# Patient Record
Sex: Male | Born: 1955
Health system: Southern US, Community
[De-identification: ages and names within clinical notes are randomized; demographics above are authoritative.]

## PROBLEM LIST (undated history)

## (undated) DIAGNOSIS — I2510 Atherosclerotic heart disease of native coronary artery without angina pectoris: Secondary | ICD-10-CM

## (undated) DIAGNOSIS — I252 Old myocardial infarction: Secondary | ICD-10-CM

## (undated) DIAGNOSIS — E7800 Pure hypercholesterolemia, unspecified: Secondary | ICD-10-CM

## (undated) DIAGNOSIS — E8881 Metabolic syndrome: Secondary | ICD-10-CM

## (undated) DIAGNOSIS — I10 Essential (primary) hypertension: Secondary | ICD-10-CM

## (undated) DIAGNOSIS — M1990 Unspecified osteoarthritis, unspecified site: Secondary | ICD-10-CM

## (undated) DIAGNOSIS — M549 Dorsalgia, unspecified: Secondary | ICD-10-CM

## (undated) DIAGNOSIS — M25559 Pain in unspecified hip: Secondary | ICD-10-CM

## (undated) DIAGNOSIS — G4733 Obstructive sleep apnea (adult) (pediatric): Secondary | ICD-10-CM

## (undated) DIAGNOSIS — G5600 Carpal tunnel syndrome, unspecified upper limb: Secondary | ICD-10-CM

## (undated) DIAGNOSIS — E119 Type 2 diabetes mellitus without complications: Secondary | ICD-10-CM

## (undated) DIAGNOSIS — Z9861 Coronary angioplasty status: Secondary | ICD-10-CM

## (undated) DIAGNOSIS — H9319 Tinnitus, unspecified ear: Secondary | ICD-10-CM

## (undated) HISTORY — DX: Carpal tunnel syndrome, unspecified upper limb: G56.00

## (undated) HISTORY — PX: CORONARY ANGIOPLASTY WITH STENT PLACEMENT: SHX49

## (undated) HISTORY — DX: Essential (primary) hypertension: I10

## (undated) HISTORY — DX: Atherosclerotic heart disease of native coronary artery without angina pectoris: I25.10

## (undated) HISTORY — PX: WRIST SURGERY: SHX841

## (undated) HISTORY — DX: Metabolic syndrome: E88.81

## (undated) HISTORY — DX: Old myocardial infarction: I25.2

## (undated) HISTORY — DX: Obstructive sleep apnea (adult) (pediatric): G47.33

## (undated) HISTORY — DX: Dorsalgia, unspecified: M54.9

## (undated) HISTORY — DX: Coronary angioplasty status: Z98.61

## (undated) HISTORY — DX: Tinnitus, unspecified ear: H93.19

## (undated) HISTORY — PX: TONSILLECTOMY: SUR1361

## (undated) HISTORY — DX: Unspecified osteoarthritis, unspecified site: M19.90

## (undated) HISTORY — DX: Pain in unspecified hip: M25.559

---

## 1999-03-05 ENCOUNTER — Emergency Department (HOSPITAL_COMMUNITY): Admission: EM | Admit: 1999-03-05 | Discharge: 1999-03-05 | Payer: Self-pay | Admitting: Emergency Medicine

## 1999-09-28 ENCOUNTER — Emergency Department (HOSPITAL_COMMUNITY): Admission: EM | Admit: 1999-09-28 | Discharge: 1999-09-28 | Payer: Self-pay | Admitting: Emergency Medicine

## 2006-03-31 ENCOUNTER — Encounter: Admission: RE | Admit: 2006-03-31 | Discharge: 2006-03-31 | Payer: Self-pay | Admitting: Family Medicine

## 2007-10-07 LAB — HM COLONOSCOPY: HM Colonoscopy: NORMAL

## 2008-02-14 ENCOUNTER — Inpatient Hospital Stay (HOSPITAL_COMMUNITY): Admission: AD | Admit: 2008-02-14 | Discharge: 2008-02-16 | Payer: Self-pay | Admitting: Cardiology

## 2008-02-14 ENCOUNTER — Ambulatory Visit: Payer: Self-pay | Admitting: Cardiology

## 2008-03-01 ENCOUNTER — Ambulatory Visit: Payer: Self-pay | Admitting: Cardiology

## 2008-03-09 ENCOUNTER — Encounter (HOSPITAL_COMMUNITY): Admission: RE | Admit: 2008-03-09 | Discharge: 2008-04-05 | Payer: Self-pay | Admitting: Cardiology

## 2009-03-06 ENCOUNTER — Telehealth: Payer: Self-pay | Admitting: Cardiology

## 2009-08-08 ENCOUNTER — Encounter: Payer: Self-pay | Admitting: Cardiology

## 2009-08-08 LAB — CONVERTED CEMR LAB
ALT: 21 units/L
BUN: 14 mg/dL
Creatinine, Ser: 0.9 mg/dL
Direct LDL: 64 mg/dL
HDL: 77 mg/dL
Hgb A1c MFr Bld: 8.8 %
LDL Cholesterol: 50 mg/dL
Potassium: 3.7 meq/L
Total CK: 1480 units/L
Triglycerides: 62 mg/dL

## 2009-08-15 ENCOUNTER — Encounter: Payer: Self-pay | Admitting: Cardiology

## 2009-08-23 ENCOUNTER — Encounter: Payer: Self-pay | Admitting: Cardiology

## 2010-01-30 ENCOUNTER — Ambulatory Visit: Payer: Self-pay | Admitting: Family Medicine

## 2010-03-13 ENCOUNTER — Ambulatory Visit: Payer: Self-pay | Admitting: Family Medicine

## 2010-04-24 ENCOUNTER — Ambulatory Visit: Payer: Self-pay | Admitting: Family Medicine

## 2010-05-08 ENCOUNTER — Encounter (INDEPENDENT_AMBULATORY_CARE_PROVIDER_SITE_OTHER): Payer: Self-pay | Admitting: *Deleted

## 2010-05-14 ENCOUNTER — Telehealth: Payer: Self-pay | Admitting: Cardiology

## 2010-06-26 ENCOUNTER — Ambulatory Visit: Payer: Self-pay | Admitting: Cardiology

## 2010-06-26 ENCOUNTER — Encounter (INDEPENDENT_AMBULATORY_CARE_PROVIDER_SITE_OTHER): Payer: Self-pay | Admitting: *Deleted

## 2010-06-26 DIAGNOSIS — I152 Hypertension secondary to endocrine disorders: Secondary | ICD-10-CM

## 2010-06-26 DIAGNOSIS — E1159 Type 2 diabetes mellitus with other circulatory complications: Secondary | ICD-10-CM

## 2010-06-26 DIAGNOSIS — I10 Essential (primary) hypertension: Secondary | ICD-10-CM

## 2010-06-26 DIAGNOSIS — I25110 Atherosclerotic heart disease of native coronary artery with unstable angina pectoris: Secondary | ICD-10-CM

## 2010-06-26 DIAGNOSIS — E1169 Type 2 diabetes mellitus with other specified complication: Secondary | ICD-10-CM | POA: Insufficient documentation

## 2010-06-26 DIAGNOSIS — E782 Mixed hyperlipidemia: Secondary | ICD-10-CM

## 2010-06-26 HISTORY — DX: Hypertension secondary to endocrine disorders: I15.2

## 2010-06-26 HISTORY — DX: Type 2 diabetes mellitus with other circulatory complications: E11.59

## 2010-06-26 HISTORY — DX: Atherosclerotic heart disease of native coronary artery with unstable angina pectoris: I25.110

## 2010-07-10 ENCOUNTER — Ambulatory Visit: Payer: Self-pay | Admitting: Family Medicine

## 2010-11-05 NOTE — Letter (Signed)
Summary: Generic Letter  [facility], Main Office  [address]   [place], [place] [address]   Phone: [phone]  Fax: [phone]        June 26, 2010 MRN: [id number]    Philip Black [address] [place], [place]  [address]    To Whom it May Concern:  Mr. Messimer may use Cialis or Viagra as needed. He may not use this in conjunction with nitroglycerin. If you have any questions, feel free to contact our office.         Sincerely,   Charlies Constable, MD Sherri Rad, RN, BSN

## 2010-11-05 NOTE — Progress Notes (Signed)
Summary: pt needs refill/pt is out of meds   Phone Note Refill Request Call back at Home Phone [phone] Message from:  [facility] [address]  Refills Requested: Medication #1:  PLAVIX 75 MG TABS Take one tablet by mouth daily Initial call taken by: Omer Jack,  May 14, 2010 11:30 AM  Follow-up for Phone Call        Called pharmacy, pt has refills left on RX at pharmacy pt notified to pick up. Marrion Coy, CNA  May 14, 2010 11:35 AM  Follow-up by: Marrion Coy, CNA,  May 14, 2010 11:35 AM

## 2010-11-05 NOTE — Letter (Signed)
Summary: Appointment - Reminder 2  [facility] [employer], Main Office  [address]   [place], [place] [address]   Phone: [phone]  Fax: [phone]     May 08, 2010 MRN: [id number]   Philip Black [address] [place], [place]  [address]   Dear Mr. SPRONG,  Our records indicate that it is time to schedule a follow-up appointment.  Dr.Brodie recommended that you follow up with [place] at your earliest convenience. It is very important that we reach you to schedule this appointment. We look forward to participating in your health care needs. Please contact us at the number listed above at your earliest convenience to schedule your appointment.  If you are unable to make an appointment at this time, give [place] a call so we can update our records.     Sincerely, LG Glass blower/designer

## 2010-11-05 NOTE — Assessment & Plan Note (Signed)
Summary: rov  Medications Added METFORMIN HCL 500 MG TABS (METFORMIN HCL) 2 tabs two times a day NITROSTAT 0.4 MG SUBL (NITROGLYCERIN) 1 tablet under tongue at onset of chest pain; you may repeat every 5 minutes for up to 3 doses. LIPITOR 10 MG TABS (ATORVASTATIN CALCIUM) Take one tablet by mouth daily. ASPIRIN 81 MG TBEC (ASPIRIN) Take one tablet by mouth daily      Allergies Added: NKDA  Visit Type:  rov Primary Provider:  Renford Dills  CC:  pt c/o bilateral leg cramps states this goes all the way up to his thigh says this happens more when it is hot and he is exhausted...denies any other complaints today.  History of Present Illness: Philip Black is 55 years old and return for follow up management of CAD after meds in 2 years. In 2009 he had an anterior MI treated with a drug-eluting stent to the LAD. He had residual 80% narrowing in the posterolateral branch and 90% narrowing of the marginal branch the circumflex artery. He is done well since that time. He has lost considerable weight. He's had no chest pain or palpitations.  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  His other problems include diabetes, hypertension, hyperlipidemia, and obstructive sleep apnea.  Current Medications (verified): 1)  Metoprolol Tartrate 50 Mg Tabs (Metoprolol Tartrate) .... Take One Tablet By Mouth Twice A Day 2)  Plavix 75 Mg Tabs (Clopidogrel Bisulfate) .... Take One Tablet By Mouth Daily 3)  Zetia 10 Mg Tabs (Ezetimibe) .... Take One Tablet By Mouth Daily. 4)  Actos 45 Mg Tabs (Pioglitazone Hcl) .Marland Kitchen.. 1 Tab Once Daily 5)  Hydrochlorothiazide 25 Mg Tabs (Hydrochlorothiazide) .Marland Kitchen.. 1 Tab Once Daily 6)  Quinapril Hcl 40 Mg Tabs (Quinapril Hcl) .Marland Kitchen.. 1 Tab Once Daily 7)  Metformin Hcl 500 Mg Tabs (Metformin Hcl) .... 2 Tabs Two Times A Day 8)  Nitrostat 0.4 Mg Subl (Nitroglycerin) .Marland Kitchen.. 1 Tablet Under Tongue At Onset of Chest Pain; You May Repeat Every 5 Minutes For  Up To 3 Doses. 9)  Lipitor 10 Mg Tabs (Atorvastatin Calcium) .... Take One Tablet By Mouth Daily. 10)  Aspirin 81 Mg Tbec (Aspirin) .... Take One Tablet By Mouth Daily  Allergies (verified): No Known Drug Allergies  Past History:  Past Medical History: Reviewed history from 03/27/2009 and no changes required.  1. Coronary artery disease.   2. Hypertension.   3. Hyperlipidemia.   4. Morbid obesity.   5. Type 2 diabetes mellitus.   6. Obstructive sleep apnea.      Vital Signs:  Patient profile:   55 year old male Height:      69 inches Weight:      291.8 pounds BMI:     43.25 Pulse rate:   71 / minute Pulse rhythm:   regular BP sitting:   140 / 87  (left arm) Cuff size:   large  Vitals Entered By: Philip Black, CMA (June 26, 2010 3:00 PM)  Physical Exam  Additional Exam:  Gen. Well-nourished, in no distress   Neck: No JVD, thyroid not enlarged, no carotid bruits Lungs: No tachypnea, clear without rales, rhonchi or wheezes Cardiovascular: Rhythm regular, PMI not displaced,  heart sounds  normal, no murmurs or gallops, no peripheral edema, pulses normal in all 4 extremities. Abdomen: BS normal, abdomen soft and non-tender without masses or organomegaly, no hepatosplenomegaly. MS: No deformities, no cyanosis or clubbing   Neuro:  No focal sns   Skin:  no lesions    Impression & Recommendations:  Problem # 1:  CORONARY ATHEROSCLEROSIS NATIVE CORONARY ARTERY (ICD-414.01) A. previous antrum with a drug-eluting stent to the LAD in May of 2009. He's had no chest pain was prominent are stable. Continue his current medications including Plavix. His updated medication list for this problem includes:    Metoprolol Tartrate 50 Mg Tabs (Metoprolol tartrate) .Marland Kitchen... Take one tablet by mouth twice a day    Plavix 75 Mg Tabs (Clopidogrel bisulfate) .Marland Kitchen... Take one tablet by mouth daily    Quinapril Hcl 40 Mg Tabs (Quinapril hcl) .Marland Kitchen... 1 tab once daily    Nitrostat 0.4 Mg Subl  (Nitroglycerin) .Marland Kitchen... 1 tablet under tongue at onset of chest pain; you may repeat every 5 minutes for up to 3 doses.  Orders: EKG w/ Interpretation (93000)  His updated medication list for this problem includes:    Metoprolol Tartrate 50 Mg Tabs (Metoprolol tartrate) .Marland Kitchen... Take one tablet by mouth twice a day    Plavix 75 Mg Tabs (Clopidogrel bisulfate) .Marland Kitchen... Take one tablet by mouth daily    Quinapril Hcl 40 Mg Tabs (Quinapril hcl) .Marland Kitchen... 1 tab once daily    Nitrostat 0.4 Mg Subl (Nitroglycerin) .Marland Kitchen... 1 tablet under tongue at onset of chest pain; you may repeat every 5 minutes for up to 3 doses.  Problem # 2:  HYPERTENSIVE CARDIOVASCULAR DISEASE, BENIGN (ICD-402.10) His blood pressure is reasonably well controlled on his current medications. I encouraged him to lose weight to improve this further. His updated medication list for this problem includes:    Metoprolol Tartrate 50 Mg Tabs (Metoprolol tartrate) .Marland Kitchen... Take one tablet by mouth twice a day    Hydrochlorothiazide 25 Mg Tabs (Hydrochlorothiazide) .Marland Kitchen... 1 tab once daily    Quinapril Hcl 40 Mg Tabs (Quinapril hcl) .Marland Kitchen... 1 tab once daily    Aspirin 81 Mg Tbec (Aspirin) .Marland Kitchen... Take one tablet by mouth daily  Orders: EKG w/ Interpretation (93000)  Problem # 3:  HYPERLIPIDEMIA, MIXED (ICD-272.2) This is managed with Lipitor. He will have a repeat lipid profile his primary care physician. His updated medication list for this problem includes:    Zetia 10 Mg Tabs (Ezetimibe) .Marland Kitchen... Take one tablet by mouth daily.    Lipitor 10 Mg Tabs (Atorvastatin calcium) .Marland Kitchen... Take one tablet by mouth daily.  Orders: EKG w/ Interpretation (93000)  Patient Instructions: 1)  Your physician wants you to follow-up in: 1 year with Dr. Clifton Black.  You will receive a reminder letter in the mail two months in advance. If you don't receive a letter, please call our office to schedule the follow-up appointment. 2)  Your physician recommends that you continue  on your current medications as directed. Please refer to the Current Medication list given to you today.

## 2010-11-06 ENCOUNTER — Ambulatory Visit: Admit: 2010-11-06 | Payer: Self-pay | Admitting: Family Medicine

## 2010-11-06 ENCOUNTER — Ambulatory Visit (INDEPENDENT_AMBULATORY_CARE_PROVIDER_SITE_OTHER): Payer: 59 | Admitting: Family Medicine

## 2010-11-06 DIAGNOSIS — E119 Type 2 diabetes mellitus without complications: Secondary | ICD-10-CM

## 2010-11-06 DIAGNOSIS — I10 Essential (primary) hypertension: Secondary | ICD-10-CM

## 2010-11-06 DIAGNOSIS — G4700 Insomnia, unspecified: Secondary | ICD-10-CM

## 2010-11-06 DIAGNOSIS — E669 Obesity, unspecified: Secondary | ICD-10-CM

## 2010-12-29 ENCOUNTER — Encounter: Payer: Self-pay | Admitting: Family Medicine

## 2011-01-07 NOTE — Medication Information (Signed)
Summary[facility]   Imported By: Dorna Leitz 12/29/2010 14:33:46  _____________________________________________________________________  External Attachment:    Type:   Image     Comment:   External Document

## 2011-01-08 ENCOUNTER — Encounter: Payer: Self-pay | Admitting: Family Medicine

## 2011-02-12 ENCOUNTER — Encounter: Payer: Self-pay | Admitting: Family Medicine

## 2011-02-12 ENCOUNTER — Ambulatory Visit: Payer: 59 | Admitting: Family Medicine

## 2011-02-12 ENCOUNTER — Ambulatory Visit (INDEPENDENT_AMBULATORY_CARE_PROVIDER_SITE_OTHER): Payer: 59 | Admitting: Family Medicine

## 2011-02-12 DIAGNOSIS — E119 Type 2 diabetes mellitus without complications: Secondary | ICD-10-CM

## 2011-02-12 NOTE — Patient Instructions (Signed)
Continue with her diet and exercise regimen. So make sure you check your blood sugars 2 hours after some of your meals.

## 2011-02-12 NOTE — Progress Notes (Signed)
  Subjective:    Patient ID: Philip Black, male    DOB: 1956-05-19, 55 y.o.   MRN: [id number]  HPI he is here for a diabetes recheck. He has make diet and exercise changes. He is now using a trainer to help him. As check his blood sugars in the morning and they're running right around 130. Continues to remain smoke free. Has noted an increase in his energy and he sleeping much better.    Review of Systems     Objective:   Physical Exam alert and in no distress. The globe A1c is 10.6       Assessment & Plan:  Diabetes. Hypertension. Dyslipidemia. ASHD. I discussed diet and exercise with him again. Encouraged him to continue with his exercise program. He is also to check his blood sugars 2 hours after some of his meals. I will also set him up for diabetes education. Check here in 4 months.

## 2011-02-18 NOTE — Discharge Summary (Signed)
NAMESIMCHA, [name]                 ACCOUNT NO.:  [id number]   MEDICAL RECORD NO.:  [id number]          PATIENT TYPE:  INP   LOCATION:  2040                         FACILITY:  [facility]   PHYSICIAN:  Philip [name]. Philip Chance, MD, FACCDATE OF BIRTH:  Jun 13, 1956   DATE OF ADMISSION:  02/14/2008  DATE OF DISCHARGE:  02/16/2008                               DISCHARGE SUMMARY   PRIMARY CARDIOLOGIST:  Philip [name]. Philip Chance, MD, [facility]   PRIMARY CARE Philip [name]:  Philip [name]. Philip Settle, MD.   ENDOCRINOLOGIST:  Philip [name]. Altheimer, MD   DISCHARGE DIAGNOSIS:  Acute anterior ST-segment elevation myocardial  infarction.   SECONDARY DIAGNOSES:  1. Coronary artery disease.  2. Hypertension.  3. Hyperlipidemia.  4. Morbid obesity.  5. Type 2 diabetes mellitus.  6. Obstructive sleep apnea.   ALLERGIES:  No known drug allergies.   PROCEDURE:  Left heart cardiac catheterization with successful  percutaneous coronary intervention and stenting of the proximal left  anterior descending with placement of 3.0 x 28 mm PROMUS drug-eluting  stent.   HISTORY OF PRESENT ILLNESS:  A 55 year old [place] American male with  multiple cardiac risk factors who is in his usual state of health until  Feb 14, 2008, when he awoke at 4:00 a.m. with 10/10 substernal chest  discomfort with radiation to the right arm.  He presented to the [facility] where his ECG was noted to be abnormal with  suspicion for ST elevation MI and the patient was taken urgently by EMS  directly to the [facility].   HOSPITAL COURSE:  The patient underwent emergent cardiac catheterization  revealing 90% stenosis in the proximal LAD with diffuse 90% stenoses in  the second obtuse marginal as well as an 80% stenosis in the LPDA.  The  LAD was felt to be in the infarct vessel.  EF was 60% with normal wall  motion.  Attention was turned to the LAD which was subsequently,  successfully stented with a 3.0 x 28-mm PROMUS drug-eluting stent.   The  patient tolerated this procedure well, was monitored in the Coronary  Intensive Care Unit postprocedure where he peaked his CK 1420, his MB at  20.3, and troponin I at 0.16.  The patient was maintained on aspirin,  Plavix, statin, and beta blocker therapy or as previously prescribed,  calcium channel blocker was discontinued.  The patient was transferred  back to the floor on Feb 15, 2008, and has been ambulating without  recurrent symptoms or limitations.  He has been evaluated by cardiac  rehab and has been extensively counseled the importance of dietary  restrictions, weight loss, and in cooperation of exercise into his  lifestyle.  The patient was very motivated to make lifestyle changes.  Philip [name] will be discharged home today in good condition.   DISCHARGE LABS:  Hemoglobin 12.7, hematocrit 37.2, WBC 11.6, platelets  194, INR 1.0., sodium 136, potassium 3.7, chloride 102, CO2 29, BUN 10,  creatinine 1.01, glucose 165, total bilirubin 0.6, alkaline phosphatase  45, AST 37, ALT 43, total protein 6.3, albumin 3.3, calcium 8.9,  CK 94,  MB 13.2, troponin I 0.16, total cholesterol 134, triglycerides 57, HDL  51, LDL 92, and TSH 2.123.   DISPOSITION:  The patient is being discharged home today in good  condition.   FOLLOWUP AND APPOINTMENTS:  We will arrange followup with Dr. Regino Schultze  PA, Philip [name], on Mar 01, 2008, at 10:45 a.m..  He was asked to  follow up with Dr. Leslie [name] and Dr. Nehemiah [name] as previously scheduled.   DISCHARGE MEDICATIONS:  1. Aspirin 325 mg daily.  2. Plavix 75 mg daily.  3. Welchol 1250 mg b.i.d.  4. Quinapril 40 mg daily.  5. Glyburide 5 mg daily.  6. HCTZ 25 mg daily.  7. Actos 45 mg daily.  8. Zetia 10 mg daily.  9. Byetta 10 mcg injected subcutaneously t.i.d. with meals.  10.Crestor 5 mg daily.  11.Lopressor 50 mg b.i.d.  12.Nitroglycerin 0.4 mg sublingual p.r.n. chest pain.   OUTSTANDING LAB STUDIES:  None.   DURATION OF  DISCHARGE/ENCOUNTER:  65 minutes including physician time.      Philip [name], ANP      Philip R. Philip Chance, MD, [facility]  Electronically Signed    CB/MEDQ  D:  02/16/2008  T:  02/17/2008  Job:  [id number]   cc:   Philip [name], M.D.  Philip [name]. Polite, M.D.

## 2011-02-18 NOTE — H&P (Signed)
NAMEDEAUNTE, DENTE                 ACCOUNT NO.:  [id number]   MEDICAL RECORD NO.:  [id number]          PATIENT TYPE:  INP   LOCATION:  2908                         FACILITY:  [facility]   PHYSICIAN:  Everardo Beals. Juanda Chance, MD, FACCDATE OF BIRTH:  25-Jun-1956   DATE OF ADMISSION:  02/14/2008  DATE OF DISCHARGE:                              HISTORY & PHYSICAL   PRIMARY CARE PHYSICIAN:  Deirdre Peer. Polite, M.D.   PRIMARY CARDIOLOGIST:  New will be Bruce R. Juanda Chance, MD, [facility]   CHIEF COMPLAINT:  Chest pain.   HISTORY OF PRESENT ILLNESS:  Mr. Eisenbeis is a 55 year old male with no  previous history of coronary artery disease with multiple cardiac risk  factors.  He was awakened at 4:00 a.m. today by chest pain.  It reached  to 10/10.  It was in the middle of his chest that radiated over to his  right arm.  When his symptoms did not resolve, he went to [facility].  There, his EKG was noted to be abnormal.  He has had 4  baby aspirin and was transported urgently by the EMS directly to the  cath lab.  Upon arrival to the cath lab, he was pain free.   Mr. Cocuzza states he has never had pain like this before.  It was  associated with shortness of breath and nausea, but no vomiting or  diaphoresis.  He is currently resting comfortably.   PAST MEDICAL HISTORY:  1. Diabetes.  2. Hypertension.  3. Hyperlipidemia.  4. Morbid obesity with a body mass index of 47.  5. Obstructive sleep apnea.   FAMILY HISTORY:  Coronary artery disease.  Mother died at age 77 with  diabetes.  His father died at age 75 with a history of hypertension and  coronary artery disease.  He has no siblings with coronary artery  disease.   SURGICAL HISTORY:  None known.   ALLERGIES:  No known drug allergies.   CURRENT MEDICATIONS:  1. Byetta 10 mcg.  2. WelChol 625 mg.  3. Crestor 5 mg.  4. Cardizem CD 300 mg.  5. Accupril 40 mg.  6. Glyburide 5 mg.  7. Hydrochlorothiazide 25 mg.  8. Actos 45 mg.  9. Zetia 10  mg.   SOCIAL HISTORY:  Lives in [place] with his wife and works in the city  of [place] as well as [employer].  He denies any history of alcohol,  tobacco, or drug abuse.   REVIEW OF SYSTEMS:  He has had no recent fevers, chills, or sweats.  There is no coughing or wheezing.  He has occasional daytime edema.  He  has some arthralgias.  He has not had hematemesis, hemoptysis, or  melena.  Full 14-point review of systems is otherwise negative.   PHYSICAL EXAMINATION:  VITAL SIGNS:  He is afebrile.  Blood pressure  130/95, heart rate 57, respiratory rate 18, and nonlabored.  GENERAL:  He is a well-developed, obese African American male in no  acute distress.  HEENT:  Normal.  NECK:  There is no lymphadenopathy, thyromegaly, bruit, or JVD  noted.  CV:  Heart is regular rate and rhythm with an S1 and S2.  No critical  murmur, rub, or gallop is noted.  CHEST:  Essentially clear to auscultation bilaterally.  ABDOMEN:  Soft and nontender with active bowel sounds.  EXTREMITIES:  There is no cyanosis, clubbing, or edema noted.  Distal  pulses are intact in all 4 extremities, and no femoral bruits are  appreciated.  SKIN:  No rashes or lesions are noted.  MUSCULOSKELETAL:  There is no joint deformity or effusions.  NEURO:  He is alert and oriented.  Cranial nerves II-XII grossly intact.   EKG, his sinus bradycardia rate 57 with ST elevation in the lateral  leads versus early repolarization.  Laboratory values and chest x-ray  are pending.   IMPRESSION:  ST elevation myocardial infarction:  He is being taken  directly to the cath lab with further evaluation and treatment depending  on the results of the catheterization.  He will be continued on his home  medications, and we will check a lipid profile and hemoglobin A1c.      Theodore Demark, PA-C      Bruce R. Juanda Chance, MD, [facility]  Electronically Signed    RB/MEDQ  D:  02/14/2008  T:  02/15/2008  Job:  [id number]   cc:   Deirdre Peer.  Polite, M.D.

## 2011-02-18 NOTE — Cardiovascular Report (Signed)
NAMEJORDON, KRISTIANSEN                 ACCOUNT NO.:  [id number]   MEDICAL RECORD NO.:  [id number]          PATIENT TYPE:  INP   LOCATION:  2908                         FACILITY:  [facility]   PHYSICIAN:  Everardo Beals. Juanda Chance, MD, FACCDATE OF BIRTH:  1956/08/18   DATE OF PROCEDURE:  02/14/2008  DATE OF DISCHARGE:                            CARDIAC CATHETERIZATION   CLINICAL HISTORY:  Mr. Bowen is 55 years old and has no prior history of  known heart disease, although he does have diabetes, hypertension, sleep  apnea, and obesity.  He had the onset of chest pain at 4:00 a.m. and was  driven by his [facility] this  morning.  His ECG there showed 1-mm ST elevation in the precordial leads  and code STEMI was called and he was transferred by ambulance here.  His  ECG on arrival here showed ST elevation, but it was not certain whether  this was early repolarization or injury.   The procedure was performed on the right femoral artery using arterial  sheath and 6-French preformed coronary catheters.  We had difficulty  with arterial access, but we were finally able to obtain access with a  Doppler-guided needle.  After completion of diagnostic study, we made a  decision to proceed with intervention on the lesion in the proximal LAD.  The patient was given antiemetics bolus and infusion and had been given  4 chewable aspirin in ambulance.  A 600 mg of Plavix was given at the  end of the procedure.  We used a Q4 6-French guiding catheter with side  holes.  We crossed the lesion with a Prowater wire without difficulty.  We predilated the lesion with a 2.5-mm x 50-mm apex balloon performing 2  inflations up to 12 atmospheres for 30 seconds.  We then deployed a 3.0-  mm x 28-mm PROMUS stent performing 1 inflation up to 14 atmospheres for  30 seconds.  We then postdilated with a 3.5-mm x 20-mm Quantum Maverick  following 2 inflations up to 18 atmospheres for 30 seconds.  We  then  postdilated the focal area with a 2.75-mm x 12-mm Quantum Maverick  performing 1 inflation up to 20 atmospheres for 30 seconds.  Final  diagnostics were then performed through the guiding catheter.  The  patient tolerated the procedure well and left laboratory in satisfactory  condition.   RESULTS:  Aortic pressure was 135/78 with a mean of 101, left  ventricular pressure 135/20.   Left main coronary artery:  The left main coronary artery was free of  disease.   Left anterior descending artery:  The left descending artery gave rise  to 3 diagonal branches and 2 set of perforators.  There was a 90%  narrowing in the proximal LAD with segmental disease over about 25 mm.   The circumflex artery:  This was a dominant vessel, gave rise to a first  large marginal branch, second small marginal branch to posterolateral  branch and posterior descending branch.  There were tandem 90% stenosis  in the second marginal branch, which is  a small vessel.  There is 50%  narrowing in the mid circumflex artery.  There is 80% narrowing in the  proximal portion of the posterior descending branch.   The right coronary artery:  The right coronary artery was a moderate-  sized vessel, gave rise to a conus branch and 2 right ventricle  branches.  This vessel was free of disease.   The left ventricle:  The left ventriculogram performed on RAO projection  showed good wall motion with no areas of hypokinesis.  Estimated  ejection fraction was 60%.   Following stenting of the lesion, LAD stenosis improved from 90% to 0%.  The flow was TIMI III before and after intervention.   CONCLUSION:  Coronary artery disease with possible ST-elevation  myocardial infarction with 90% stenosis in the proximal LAD, 90%  stenosis in the second marginal branch of circumflex artery, 50%  narrowing in the mid circumflex artery, and 80% narrowing in the  posterior branch off the circumflex artery, no significant  obstruction.  A small dominant right coronary and normal LV function.  1. Successful PCI of the lesion in the proximal LAD using a PROMUS      drug-eluting stent with improvement of the central narrowing from      90% to 0% with TIMI III flow before and after intervention.   RECOMMENDATIONS:  The patient returned to post angio room for further  observation.  It is not clear whether this was STEMI or not.  There was  no clear wall motion abnormality.  His enzymes returned positive.  I  would classify this as STEMI.  If not, I would classify it as unstable  angina.  We will plan and continued secondary risk factor modification.      Bruce Elvera Lennox Juanda Chance, MD, [facility]  Electronically Signed     BRB/MEDQ  D:  02/14/2008  T:  02/15/2008  Job:  [id number]   cc:   [facility]

## 2011-02-18 NOTE — Assessment & Plan Note (Signed)
[name]                            CARDIOLOGY OFFICE NOTE   Philip [name], DUCHEMIN                        MRN:          [id number]  DATE:03/01/2008                            DOB:          1955-12-19    PRIMARY CARDIOLOGIST:  Dr. Charlies Constable.   PRIMARY CARE [name]:  Dr. Renford Dills.   ENDOCRINOLOGIST:  Dr. Casimiro Needle Altheimer.   HISTORY OF PRESENT ILLNESS:  This is a 55 year old [place] American male  patient who presented with an acute anterior ST-segment elevation MI on  Feb 14, 2008.  He underwent cardiac catheterization by Dr. Charlies Constable  and had a Promus drug-eluting stent to the proximal LAD with reduction  in lesion from 90 to 0.  He also has residual 90% stenosis in the second  marginal branch of the circumflex, 50% narrowing in the mid circumflex  and 80% narrowing in the posterior branch off the circumflex artery.  He  has a small dominant RCA without obstruction and normal LV function.  Since the patient has been home, he has only exercised once on the  treadmill, and he stated he became very tired and sore.  He denied any  chest pain, palpitations, dyspnea, dyspnea on exertion.  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   CURRENT MEDICATIONS:  1. Plavix 75 mg daily.  2. Zetia 10 mg daily.  3. Welchol 625 mg 6 daily.  4. Glipizide 5 mg daily.  5. Actos 45 mg daily.  6. Hydrochlorothiazide 25 mg daily.  7. Quinapril 40 mg daily.  8. Beta 10 mg 3 daily.  9. Metoprolol 50 mg daily.   PHYSICAL EXAMINATION:  GENERAL:  This is a very pleasant 55 year old  obese African American male in no acute distress.  VITAL SIGNS:  Blood pressure 130/79, pulse 55, weight 305.  NECK:  Without JVD, HJR, bruit or thyroid enlargement.  LUNGS:  Clear anterior posterior lateral.  HEART:  Regular rate and rhythm at 55 beats per minute, normal S1-S2, no  murmur, rub, bruit, thrill or heave noted.  ABDOMEN:  Obese.  Normoactive bowel sounds are heard throughout.  No  organomegaly, masses, lesions or abnormal tenderness.  Right groin  without hematoma or hemorrhage.  LOWER EXTREMITIES:  He has no cyanosis, clubbing or edema.  Has good  distal pulses.   STUDIES:  EKG sinus bradycardia with nonspecific ST-T wave changes.   IMPRESSION:  1. Acute anterior ST elevation MI treated with Promus drug-eluting      stent to the proximal LAD on Feb 14, 2008, with residual 90% OM II,      50% mid circumflex, and 80% posterior branch off the circumflex      normal LV function.  2. Morbid obesity.  3. Diabetes mellitus.  4. Hypertension.  5. Hyperlipidemia.  6. Obstructive sleep apnea.  PLAN:  At this time, I have stressed the importance of losing weight  with this patient and highly recommend cardiac rehab to get him on an  exercise program.  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  I have asked him to call if he has any further  chest pain and he will see Dr. Juanda Chance back in 2 months.      Philip Reedy, PA-C  Electronically Signed      Philip Sidle, MD  Electronically Signed   ML/MedQ  DD: 03/01/2008  DT: 03/01/2008  Job #: [id number]

## 2011-03-12 ENCOUNTER — Other Ambulatory Visit: Payer: Self-pay

## 2011-03-12 MED ORDER — ROSUVASTATIN CALCIUM 20 MG PO TABS: 20.0000 mg | ORAL_TABLET | Freq: Every day | ORAL | Status: DC

## 2011-03-17 ENCOUNTER — Other Ambulatory Visit: Payer: Self-pay | Admitting: Family Medicine

## 2011-03-17 MED ORDER — METFORMIN HCL 500 MG PO TABS: 1000.0000 mg | ORAL_TABLET | Freq: Two times a day (BID) | ORAL | Status: DC

## 2011-05-02 ENCOUNTER — Other Ambulatory Visit: Payer: Self-pay | Admitting: *Deleted

## 2011-05-02 MED ORDER — CLOPIDOGREL BISULFATE 75 MG PO TABS: 75.0000 mg | ORAL_TABLET | Freq: Every day | ORAL | Status: DC

## 2011-05-14 ENCOUNTER — Ambulatory Visit (INDEPENDENT_AMBULATORY_CARE_PROVIDER_SITE_OTHER): Payer: 59 | Admitting: Family Medicine

## 2011-05-14 ENCOUNTER — Encounter: Payer: Self-pay | Admitting: Family Medicine

## 2011-05-14 DIAGNOSIS — E669 Obesity, unspecified: Secondary | ICD-10-CM | POA: Insufficient documentation

## 2011-05-14 DIAGNOSIS — I10 Essential (primary) hypertension: Secondary | ICD-10-CM

## 2011-05-14 DIAGNOSIS — M25511 Pain in right shoulder: Secondary | ICD-10-CM

## 2011-05-14 DIAGNOSIS — I152 Hypertension secondary to endocrine disorders: Secondary | ICD-10-CM

## 2011-05-14 DIAGNOSIS — IMO0002 Reserved for concepts with insufficient information to code with codable children: Secondary | ICD-10-CM | POA: Insufficient documentation

## 2011-05-14 DIAGNOSIS — E119 Type 2 diabetes mellitus without complications: Secondary | ICD-10-CM

## 2011-05-14 DIAGNOSIS — E785 Hyperlipidemia, unspecified: Secondary | ICD-10-CM

## 2011-05-14 DIAGNOSIS — E1165 Type 2 diabetes mellitus with hyperglycemia: Secondary | ICD-10-CM | POA: Insufficient documentation

## 2011-05-14 DIAGNOSIS — E1159 Type 2 diabetes mellitus with other circulatory complications: Secondary | ICD-10-CM

## 2011-05-14 DIAGNOSIS — M25519 Pain in unspecified shoulder: Secondary | ICD-10-CM

## 2011-05-14 DIAGNOSIS — E1169 Type 2 diabetes mellitus with other specified complication: Secondary | ICD-10-CM

## 2011-05-14 LAB — POCT GLYCOSYLATED HEMOGLOBIN (HGB A1C): Hemoglobin A1C: 11.2

## 2011-05-14 NOTE — Patient Instructions (Signed)
You will start on 10 units of insulin daily. Increase 2 units every 2 days until your blood sugar in the morning is under 120. Return here in 2 weeks. Call if any problems.

## 2011-05-14 NOTE — Progress Notes (Signed)
  Subjective:    Patient ID: REA RESER, male    DOB: Jan 26, 1956, 55 y.o.   MRN: [id number]  HPI He is here for an interval evaluation. He has lost approximately 13 pounds since May. He has made some diet and exercise changes and feels very good about this. He continues on medications listed in the chart. He has noted that certain foods do make his blood sugars go up. States his blood sugars run in the 200 range. He has not been to diabetes education classes because he did not call him. He does check his feet regularly. He does not smoke or drink. He has had difficulty with right shoulder pain. He has been seen in an urgent care Center. He did bring x-rays with him and they were reviewed.  Review of Systems     Objective:   Physical Exam Alert and in no distress. Hemoglobin A1c is 11.1. Right shoulder exam does show pain on the motion of the shoulder with abduction and external rotation as well as some internal rotation. No laxity noted in the upper arm test was uncomfortable.       Assessment & Plan:  Diabetes. Obesity. Hypertension. Hyperlipidemia. Right shoulder rotator cuff tendinitis. I will start him on insulin. Instructions were given concerning this. History turned here in 2 weeks. Also of the right shoulder was injected with Xylocaine and Kenalog without difficulty. He will let me know how this works.

## 2011-05-27 ENCOUNTER — Other Ambulatory Visit: Payer: Self-pay | Admitting: Family Medicine

## 2011-05-28 ENCOUNTER — Ambulatory Visit (INDEPENDENT_AMBULATORY_CARE_PROVIDER_SITE_OTHER): Payer: 59 | Admitting: Family Medicine

## 2011-05-28 ENCOUNTER — Encounter: Payer: Self-pay | Admitting: Family Medicine

## 2011-05-28 DIAGNOSIS — E119 Type 2 diabetes mellitus without complications: Secondary | ICD-10-CM

## 2011-05-28 NOTE — Patient Instructions (Signed)
Keep increasing your shot by 2 units every 2 days until your blood sugars under 120. If you have any questions call me

## 2011-05-28 NOTE — Progress Notes (Signed)
  Subjective:    Patient ID: Philip Black, male    DOB: 1955-11-04, 55 y.o.   MRN: [id number]  HPI He is here for a followup visit. He is now on 20 units of insulin. He has concerns over taking more insulin and is starting to recognize the need to make dietary adjustments. He is scheduled to see the dietitian. He states his blood sugars in the morning are now right around 200. They were in the 240 range.   Review of Systems     Objective:   Physical Exam Alert and in no distress otherwise not examined       Assessment & Plan:  Diabetes. I again discussed the need for him to increase his insulin by 2 units every 2 days. We also discussed dietary modification. Explained that we need to get his blood sugars down now to prevent toxic effect on his system and we can readjust the insulin based on his eating habits, insulin etc. at a later date. He will return here in one month. He is to call with any troubles.

## 2011-06-04 ENCOUNTER — Other Ambulatory Visit: Payer: Self-pay | Admitting: Family Medicine

## 2011-06-18 ENCOUNTER — Other Ambulatory Visit: Payer: Self-pay | Admitting: Family Medicine

## 2011-06-18 ENCOUNTER — Ambulatory Visit: Payer: 59 | Admitting: Family Medicine

## 2011-07-02 ENCOUNTER — Ambulatory Visit (INDEPENDENT_AMBULATORY_CARE_PROVIDER_SITE_OTHER): Payer: 59 | Admitting: Family Medicine

## 2011-07-02 ENCOUNTER — Other Ambulatory Visit: Payer: Self-pay | Admitting: *Deleted

## 2011-07-02 ENCOUNTER — Encounter: Payer: Self-pay | Admitting: Family Medicine

## 2011-07-02 ENCOUNTER — Ambulatory Visit: Payer: 59 | Admitting: Cardiovascular Disease

## 2011-07-02 DIAGNOSIS — E1159 Type 2 diabetes mellitus with other circulatory complications: Secondary | ICD-10-CM

## 2011-07-02 DIAGNOSIS — I152 Hypertension secondary to endocrine disorders: Secondary | ICD-10-CM

## 2011-07-02 DIAGNOSIS — Z23 Encounter for immunization: Secondary | ICD-10-CM

## 2011-07-02 DIAGNOSIS — E1169 Type 2 diabetes mellitus with other specified complication: Secondary | ICD-10-CM

## 2011-07-02 DIAGNOSIS — E119 Type 2 diabetes mellitus without complications: Secondary | ICD-10-CM

## 2011-07-02 DIAGNOSIS — I10 Essential (primary) hypertension: Secondary | ICD-10-CM

## 2011-07-02 DIAGNOSIS — E669 Obesity, unspecified: Secondary | ICD-10-CM

## 2011-07-02 NOTE — Progress Notes (Signed)
  Subjective:    Patient ID: Philip Black, male    DOB: 03-Dec-1955, 55 y.o.   MRN: [id number]  HPI He is here for a followup visit. He did bring an FMLA form with him stating that he apparently has missed several days of work. When I ask him why. He gave me multiple reasons for missing work mainly revolving around physically demanding job that he has and the aches and pains associated with them. I explained that an FMLA could not be given for this. He has stopped his insulin on his own on the insulin ran out and did not call me for refill. His blood sugars are better but still in the 150 and higher range. He has been to the dietitian and did learn a lot concerning portion size as well as carbohydrate and proteins. He notes when he eats better he has decreased urinary output. He will be working out again with his trainer.   Review of Systems     Objective:   Physical Exam Alert and in no distress otherwise not examined       Assessment & Plan:   1. Diabetes mellitus   2. Hypertension associated with diabetes   3. Obesity (BMI 30-39.9)    I encouraged him to continue with his diet and exercise regimen. We discussed adding another medication however he would like to wait and see what he could do to reduce his blood sugars and his weight over the next several months. Recheck here in 3 months

## 2011-07-02 NOTE — Patient Instructions (Signed)
Work on getting your blood sugars in to the low 100s. Continue with your exercise and dietary modification. Recheck here in 3 months.

## 2011-07-02 NOTE — Telephone Encounter (Signed)
Called in test strips #100 check 2-3 times daily with 1 refill to [facility] per [place].  CM, LPN

## 2011-07-15 ENCOUNTER — Ambulatory Visit: Payer: 59 | Admitting: Cardiovascular Disease

## 2011-07-19 ENCOUNTER — Other Ambulatory Visit: Payer: Self-pay | Admitting: Family Medicine

## 2011-10-08 ENCOUNTER — Ambulatory Visit: Payer: 59 | Admitting: Family Medicine

## 2011-11-04 ENCOUNTER — Other Ambulatory Visit: Payer: Self-pay | Admitting: Family Medicine

## 2011-11-04 ENCOUNTER — Other Ambulatory Visit: Payer: Self-pay | Admitting: Cardiovascular Disease

## 2011-11-14 ENCOUNTER — Other Ambulatory Visit: Payer: Self-pay | Admitting: Cardiovascular Disease

## 2011-11-19 ENCOUNTER — Ambulatory Visit: Payer: 59 | Admitting: Family Medicine

## 2011-11-27 ENCOUNTER — Encounter: Payer: Self-pay | Admitting: Internal Medicine

## 2011-12-03 ENCOUNTER — Encounter: Payer: Self-pay | Admitting: Family Medicine

## 2011-12-03 ENCOUNTER — Ambulatory Visit (INDEPENDENT_AMBULATORY_CARE_PROVIDER_SITE_OTHER): Payer: 59 | Admitting: Family Medicine

## 2011-12-03 VITALS — BP 138/88 | HR 64 | Ht 70.0 in | Wt 268.0 lb

## 2011-12-03 DIAGNOSIS — E782 Mixed hyperlipidemia: Secondary | ICD-10-CM

## 2011-12-03 DIAGNOSIS — E669 Obesity, unspecified: Secondary | ICD-10-CM

## 2011-12-03 DIAGNOSIS — E119 Type 2 diabetes mellitus without complications: Secondary | ICD-10-CM

## 2011-12-03 DIAGNOSIS — I2510 Atherosclerotic heart disease of native coronary artery without angina pectoris: Secondary | ICD-10-CM

## 2011-12-03 NOTE — Progress Notes (Signed)
  Subjective:    Patient ID: Philip Black, male    DOB: [birth year], 56 y.o.   MRN: [id number]  HPI He is here for a diabetes recheck. He has been off insulin since approximately August. He did lose a significant amount of weight prior to this. He also been visited the nutritionist and made some dietary changes. He has noted a definite decrease in his blood sugars. He does note that his exercise pattern over the winter has been down. He's had no chest pain or shortness of breath. Continues on other medications listed in the chart. His last eye exam was within the last year. He does check his feet periodically.   Review of Systems     Objective:   Physical Exam Alert and in no distress. He will A1c is 7.9 down from 11.2       Assessment & Plan:   1. Type II or unspecified type diabetes mellitus without mention of complication, not stated as uncontrolled  POCT HgB A1C  2. Diabetes mellitus    3. HYPERLIPIDEMIA, MIXED    4. CORONARY ATHEROSCLEROSIS NATIVE CORONARY ARTERY    5. Obesity (BMI 30-39.9)     strongly encouraged him to continue with his present dietary modification and increase his physical activity.

## 2011-12-03 NOTE — Patient Instructions (Signed)
Keep up the good work. Check your blood sugars 2 hours after meals and if they're too high, walker which he been eating and make adjustments. I will see you in 4 months.

## 2011-12-08 ENCOUNTER — Other Ambulatory Visit: Payer: Self-pay | Admitting: Family Medicine

## 2011-12-09 ENCOUNTER — Other Ambulatory Visit: Payer: Self-pay | Admitting: Family Medicine

## 2011-12-20 ENCOUNTER — Other Ambulatory Visit: Payer: Self-pay | Admitting: Family Medicine

## 2012-02-09 ENCOUNTER — Other Ambulatory Visit: Payer: Self-pay | Admitting: Family Medicine

## 2012-03-11 ENCOUNTER — Other Ambulatory Visit: Payer: Self-pay | Admitting: Family Medicine

## 2012-03-11 ENCOUNTER — Telehealth (HOSPITAL_COMMUNITY): Payer: Self-pay

## 2012-03-11 ENCOUNTER — Other Ambulatory Visit: Payer: Self-pay | Admitting: Cardiovascular Disease

## 2012-03-11 NOTE — Telephone Encounter (Signed)
I  called home number and moblie number and left message to call office to make an appointment

## 2012-03-11 NOTE — Telephone Encounter (Signed)
..   Requested Prescriptions   Pending Prescriptions Disp Refills  . clopidogrel (PLAVIX) 75 MG tablet [Pharmacy Med Name: CLOPIDOGREL 75 MG TABLET] 30 tablet 2    Sig: TAKE 1 TABLET EVERY DAY  Patient needs appointment to continue getting meds.

## 2012-03-31 ENCOUNTER — Ambulatory Visit: Payer: 59 | Admitting: Family Medicine

## 2012-04-07 ENCOUNTER — Ambulatory Visit: Payer: 59 | Admitting: Family Medicine

## 2012-04-28 ENCOUNTER — Ambulatory Visit (INDEPENDENT_AMBULATORY_CARE_PROVIDER_SITE_OTHER): Payer: 59 | Admitting: Family Medicine

## 2012-04-28 ENCOUNTER — Encounter: Payer: Self-pay | Admitting: Family Medicine

## 2012-04-28 VITALS — BP 128/80 | HR 76 | Wt 250.0 lb

## 2012-04-28 DIAGNOSIS — I119 Hypertensive heart disease without heart failure: Secondary | ICD-10-CM

## 2012-04-28 DIAGNOSIS — E782 Mixed hyperlipidemia: Secondary | ICD-10-CM

## 2012-04-28 DIAGNOSIS — E119 Type 2 diabetes mellitus without complications: Secondary | ICD-10-CM

## 2012-04-28 DIAGNOSIS — N529 Male erectile dysfunction, unspecified: Secondary | ICD-10-CM

## 2012-04-28 DIAGNOSIS — E669 Obesity, unspecified: Secondary | ICD-10-CM

## 2012-04-28 LAB — POCT GLYCOSYLATED HEMOGLOBIN (HGB A1C): Hemoglobin A1C: 11.9

## 2012-04-28 MED ORDER — ALOGLIPTIN-METFORMIN HCL 12.5-1000 MG PO TABS: 1.0000 | ORAL_TABLET | Freq: Two times a day (BID) | ORAL | Status: DC

## 2012-04-28 MED ORDER — VARDENAFIL HCL 20 MG PO TABS: 20.0000 mg | ORAL_TABLET | Freq: Every day | ORAL | Status: DC | PRN

## 2012-04-28 NOTE — Progress Notes (Signed)
  Subjective:    Patient ID: Philip Black, male    DOB: 01-21-1956, 56 y.o.   MRN: [id number]  HPI He is here for a followup. He has made some dietary changes and has lost several pounds. He has noted that his blood sugars run in the 160-200 range. Further questioning indicates this is usually before meals. He has some confusion on his blood sugar readings. He does.truck which interferes with physical activities. His smoking and drinking history was reviewed. Medications were reviewed. He would also like Cialis for his ED  Review of Systems     Objective:   Physical Exam Alert and in no distress. Hb A1c is 11.9.       Assessment & Plan:   1. HYPERLIPIDEMIA, MIXED    2. Diabetes mellitus  POCT glycosylated hemoglobin (Hb A1C), Alogliptin-Metformin HCl (KAZANO) 12.02-999 MG TABS  3. HYPERTENSIVE CARDIOVASCULAR DISEASE, BENIGN    4. Obesity (BMI 30-39.9)    5. ED (erectile dysfunction)  vardenafil (LEVITRA) 20 MG tablet   we discussed diet and exercise. Also discussed the fact that he needs to check his blood sugars 2 hours after meals. I will readjust his medications. He will be placed on Kazano. He is to keep track of his blood sugars. He will be switched to Levitra for insurance purposes. Discussed the possibility of also using insulin in the near future. 30 minutes spent discussing all these issues with him.

## 2012-04-28 NOTE — Patient Instructions (Addendum)
Stop your metformin and start taking Kazano twice per day. Stay on the Actos Check your blood sugars either before a meal or 2 hours after a meal.

## 2012-05-10 ENCOUNTER — Other Ambulatory Visit: Payer: Self-pay | Admitting: Family Medicine

## 2012-05-13 ENCOUNTER — Encounter: Payer: Self-pay | Admitting: Internal Medicine

## 2012-05-19 ENCOUNTER — Encounter: Payer: Self-pay | Admitting: Cardiovascular Disease

## 2012-05-19 ENCOUNTER — Ambulatory Visit (INDEPENDENT_AMBULATORY_CARE_PROVIDER_SITE_OTHER): Payer: 59 | Admitting: Cardiovascular Disease

## 2012-05-19 VITALS — BP 143/86 | HR 62 | Ht 70.0 in | Wt 256.0 lb

## 2012-05-19 DIAGNOSIS — I2510 Atherosclerotic heart disease of native coronary artery without angina pectoris: Secondary | ICD-10-CM

## 2012-05-19 NOTE — Assessment & Plan Note (Addendum)
Stable. He is on good medications. He is not on a beta blocker secondary to bradycardia and fatigue in past. Will continue ASA/statin/Plavix/Ace-inh. BP is well controlled at home. He is exercising 4-5 days per week. Lipids are well controlled per pt. Followed in primary care. We discussed screening

## 2012-05-19 NOTE — Patient Instructions (Addendum)
Your physician wants you to follow-up in:  12 months.  You will receive a reminder letter in the mail two months in advance. If you don't receive a letter, please call our office to schedule the follow-up appointment.   

## 2012-05-19 NOTE — Progress Notes (Signed)
History of Present Illness: 56 yo male with history of CAD, HTN, HLD, DM, OSA here today for cardiac follow up. He has been followed in the past by Dr. Juanda Chance and has not been seen in this office since September 2011. In May 2009 he had an anterior MI treated with a drug-eluting stent to the LAD. He had residual 80% narrowing in the posterolateral branch and 90% narrowing of the marginal branch the circumflex artery which was managed medically.   He is here today for follow up. He has had no chest pain or SOB. He has lost 50 lbs over the two years by exercising. He is taking all meds. His BP is well controlled at home.   Primary Care Physician: Sharlot Gowda  Last Lipid Profile: Followed in primary care and controlled per pt.  Lipid Panel     Component Value Date/Time   CHOL 139 08/08/2009   TRIG 62 08/08/2009   HDL 77 08/08/2009   LDLCALC 50 08/08/2009      Past Medical History  Diagnosis Date  . Diabetes mellitus type 2  . CAD S/P percutaneous coronary angioplasty   . OSA (obstructive sleep apnea)   . Hypertension   . Metabolic syndrome   . Arthritis     Past Surgical History  Procedure Date  . Tonsillectomy     Current Outpatient Prescriptions  Medication Sig Dispense Refill  . Alogliptin-Metformin HCl (KAZANO) 12.02-999 MG TABS Take 1 capsule by mouth 2 (two) times daily.  60 tablet  5  . aspirin 81 MG tablet Take 81 mg by mouth daily.        . clopidogrel (PLAVIX) 75 MG tablet TAKE 1 TABLET EVERY DAY  30 tablet  2  . CRESTOR 40 MG tablet TAKE 1/2 TABLET DAILY  15 tablet  3  . hydrochlorothiazide (HYDRODIURIL) 25 MG tablet TAKE 1 TABLET BY MOUTH ONCE DAILY  30 tablet  11  . meloxicam (MOBIC) 15 MG tablet TAKE 1 TABLET ONCE A DAY ORALLY  30 tablet  5  . pioglitazone (ACTOS) 45 MG tablet TAKE 1 TABLET EVERY DAY  30 tablet  5  . quinapril (ACCUPRIL) 40 MG tablet TAKE 1 TABLET EVERY DAY FOR BLOOD PRESSURE  31 tablet  5  . vardenafil (LEVITRA) 20 MG tablet Take 1 tablet (20  mg total) by mouth daily as needed for erectile dysfunction.  6 tablet  11  . WELCHOL 625 MG tablet TAKE 1 TABLET AS DIRECTED  180 tablet  4  . ZETIA 10 MG tablet TAKE 1 TABLET BY MOUTH EVERY DAY FOR CHOLSTEROL  30 tablet  5    No Known Allergies  History   Social History  . Marital Status: Married    Spouse Name: N/A    Number of Children: 3  . Years of Education: N/A   Occupational History  . Trash collection [employer]   Social History Main Topics  . Smoking status: Never Smoker   . Smokeless tobacco: Never Used  . Alcohol Use: No  . Drug Use: No  . Sexually Active: Yes   Other Topics Concern  . Not on file   Social History Narrative  . No narrative on file    Family History  Problem Relation Age of Onset  . Diabetes Mother   . Heart attack Father     Review of Systems:  As stated in the HPI and otherwise negative.   BP 143/86  Pulse 62  Ht 5\' 10"  (  1.778 m)  Wt 256 lb (116.121 kg)  BMI 36.73 kg/m2  Physical Examination: General: Well developed, well nourished, NAD HEENT: OP clear, mucus membranes moist SKIN: warm, dry. No rashes. Neuro: No focal deficits Musculoskeletal: Muscle strength 5/5 all ext Psychiatric: Mood and affect normal Neck: No JVD, no carotid bruits, no thyromegaly, no lymphadenopathy. Lungs:Clear bilaterally, no wheezes, rhonci, crackles Cardiovascular: Regular rate and rhythm. No murmurs, gallops or rubs. Abdomen:Soft. Bowel sounds present. Non-tender.  Extremities: No lower extremity edema. Pulses are 2 + in the bilateral DP/PT.  EKG: NSR, rate 62 bpm. Normal EKG

## 2012-05-24 NOTE — Addendum Note (Signed)
Addended by: Burnett Kanaris A on: 05/24/2012 02:00 PM   Modules accepted: Orders

## 2012-06-17 ENCOUNTER — Other Ambulatory Visit: Payer: Self-pay | Admitting: Family Medicine

## 2012-06-18 ENCOUNTER — Other Ambulatory Visit: Payer: Self-pay | Admitting: Family Medicine

## 2012-06-19 ENCOUNTER — Other Ambulatory Visit: Payer: Self-pay | Admitting: Cardiovascular Disease

## 2012-07-15 ENCOUNTER — Other Ambulatory Visit: Payer: Self-pay | Admitting: Family Medicine

## 2012-07-28 ENCOUNTER — Ambulatory Visit: Payer: 59 | Admitting: Family Medicine

## 2012-08-10 ENCOUNTER — Other Ambulatory Visit: Payer: Self-pay | Admitting: Family Medicine

## 2012-08-25 ENCOUNTER — Ambulatory Visit (INDEPENDENT_AMBULATORY_CARE_PROVIDER_SITE_OTHER): Payer: 59 | Admitting: Family Medicine

## 2012-08-25 VITALS — BP 120/80 | HR 64 | Wt 262.0 lb

## 2012-08-25 DIAGNOSIS — E782 Mixed hyperlipidemia: Secondary | ICD-10-CM

## 2012-08-25 DIAGNOSIS — I119 Hypertensive heart disease without heart failure: Secondary | ICD-10-CM

## 2012-08-25 DIAGNOSIS — E119 Type 2 diabetes mellitus without complications: Secondary | ICD-10-CM

## 2012-08-25 DIAGNOSIS — E669 Obesity, unspecified: Secondary | ICD-10-CM

## 2012-08-25 DIAGNOSIS — Z23 Encounter for immunization: Secondary | ICD-10-CM

## 2012-08-25 NOTE — Progress Notes (Signed)
  Subjective:    Patient ID: Philip Black, male    DOB: May 04, 1956, 56 y.o.   MRN: [id number]  HPI He is here for a recheck. He has made some dietary changes. His exercise pattern is still somewhat minimal. He does not check his sugars regularly stating usually 2 or 3 times per month. Smoking and drinking were reviewed. He has had an eye exam within the last year. He does intermittently check his feet. He does work 2 jobs.   Review of Systems     Objective:   Physical Exam Alert and in no distress. Hb A1c is 7.3.       Assessment & Plan:   1. Diabetes mellitus  POCT glycosylated hemoglobin (Hb A1C), Amb ref to Medical Nutrition Therapy-MNT  2. Immunization due  Tdap vaccine greater than or equal to 7yo IM  3. Obesity (BMI 30-39.9)  Amb ref to Medical Nutrition Therapy-MNT  4. HYPERTENSIVE CARDIOVASCULAR DISEASE, BENIGN    5. HYPERLIPIDEMIA, MIXED     discussed possibly getting involved in a research protocol. I will fax off information concerning this. Also discussed diet and exercise with him. He will be referred to nutrition and diabetes counseling. Recheck here in several months. T. Given.

## 2012-11-02 ENCOUNTER — Other Ambulatory Visit: Payer: Self-pay | Admitting: Family Medicine

## 2012-11-22 ENCOUNTER — Other Ambulatory Visit: Payer: Self-pay | Admitting: Family Medicine

## 2012-12-01 ENCOUNTER — Other Ambulatory Visit: Payer: Self-pay | Admitting: Family Medicine

## 2012-12-22 ENCOUNTER — Ambulatory Visit: Payer: 59 | Admitting: Family Medicine

## 2012-12-23 ENCOUNTER — Encounter: Payer: Self-pay | Admitting: Family Medicine

## 2012-12-23 ENCOUNTER — Ambulatory Visit (INDEPENDENT_AMBULATORY_CARE_PROVIDER_SITE_OTHER): Payer: 59 | Admitting: Family Medicine

## 2012-12-23 VITALS — BP 130/90 | HR 70 | Wt 269.0 lb

## 2012-12-23 DIAGNOSIS — I119 Hypertensive heart disease without heart failure: Secondary | ICD-10-CM

## 2012-12-23 DIAGNOSIS — E669 Obesity, unspecified: Secondary | ICD-10-CM

## 2012-12-23 DIAGNOSIS — E119 Type 2 diabetes mellitus without complications: Secondary | ICD-10-CM

## 2012-12-23 DIAGNOSIS — I2510 Atherosclerotic heart disease of native coronary artery without angina pectoris: Secondary | ICD-10-CM

## 2012-12-23 DIAGNOSIS — E782 Mixed hyperlipidemia: Secondary | ICD-10-CM

## 2012-12-23 DIAGNOSIS — Z79899 Other long term (current) drug therapy: Secondary | ICD-10-CM

## 2012-12-23 LAB — POCT UA - MICROALBUMIN: Creatinine, POC: 191.7 mg/dL

## 2012-12-23 LAB — CBC WITH DIFFERENTIAL/PLATELET
Basophils Absolute: 0 10*3/uL (ref 0.0–0.1)
Basophils Relative: 0 % (ref 0–1)
HCT: 40.5 % (ref 39.0–52.0)
Lymphocytes Relative: 29 % (ref 12–46)
MCHC: 33.3 g/dL (ref 30.0–36.0)
Neutro Abs: 5.3 10*3/uL (ref 1.7–7.7)
Neutrophils Relative %: 62 % (ref 43–77)
Platelets: 210 10*3/uL (ref 150–400)
RDW: 13.7 % (ref 11.5–15.5)
WBC: 8.5 10*3/uL (ref 4.0–10.5)

## 2012-12-23 LAB — COMPREHENSIVE METABOLIC PANEL
ALT: 16 U/L (ref 0–53)
AST: 29 U/L (ref 0–37)
Albumin: 4.3 g/dL (ref 3.5–5.2)
Alkaline Phosphatase: 43 U/L (ref 39–117)
Calcium: 9.7 mg/dL (ref 8.4–10.5)
Chloride: 99 mEq/L (ref 96–112)
Potassium: 4.3 mEq/L (ref 3.5–5.3)
Sodium: 135 mEq/L (ref 135–145)

## 2012-12-23 LAB — LIPID PANEL
HDL: 67 mg/dL (ref >39)
LDL Cholesterol: 133 mg/dL — ABNORMAL HIGH (ref 0–99)
VLDL: 15 mg/dL (ref 0–40)

## 2012-12-23 NOTE — Patient Instructions (Addendum)
Check your sugar at once per day either before a meal or 2 hours after a meal.  Keep doing your exercise. If your blood sugars after eating her above 181 she to look at her food intake. Get rid of the Dunlop's disease Work on getting that A1c down under 7

## 2012-12-23 NOTE — Progress Notes (Signed)
  Subjective:    Philip Black is a 57 y.o. male who presents for follow-up of Type 2 diabetes mellitus.    Home blood sugar records: He only checks once per month and they run between 130 inch 200.  Current symptoms/problems include none and have   been unchanged. Daily foot checks, foot concerns:  Last eye exam:     Medication compliance: Current diet: in general, an "unhealthy" diet Current exercise: cardiovascular workout on exercise equipment and walking Known diabetic complications: cardiovascular disease Cardiovascular risk factors: diabetes mellitus, dyslipidemia, hypertension, male gender and obesity (BMI >= 30 kg/m2)   The following portions of the patient's history were reviewed and updated as appropriate: past family history, past surgical history and problem list.  ROS as in subjective above    Objective:    BP 130/90  Pulse 70  Wt 269 lb (122.018 kg)  BMI 38.6 kg/m2  Filed Vitals:   12/23/12 0820  BP: 130/90  Pulse: 70    General appearence: alert, no distress, WD/WN  Heart: RRR, normal S1, S2, no murmurs Lungs: CTA bilaterally, no wheezes, rhonchi, or rales Pulses: 2+ symmetric, upper and lower extremities, normal cap refill Ext: no edema Foot exam:  Neuro: foot monofilament exam normal   Lab Review Lab Results  Component Value Date   HGBA1C 7.9 12/23/2012   Lab Results  Component Value Date   CHOL 139 08/08/2009   HDL 77 08/08/2009   LDLCALC 50 08/08/2009   LDLDIRECT 64 08/08/2009   TRIG 62 08/08/2009   No results found for this basenameConcepcion Elk     Chemistry      Component Value Date/Time   NA 136 08/08/2009   K 3.7 08/08/2009   CL 100 08/08/2009   CO2 27 08/08/2009   BUN 14 08/08/2009   CREATININE 0.9 08/08/2009      Component Value Date/Time   ALKPHOS 52 08/08/2009   AST 29 08/08/2009   ALT 21 08/08/2009   BILITOT 0.5 08/08/2009        Chemistry      Component Value Date/Time   NA 136 08/08/2009   K 3.7 08/08/2009   CL  100 08/08/2009   CO2 27 08/08/2009   BUN 14 08/08/2009   CREATININE 0.9 08/08/2009      Component Value Date/Time   ALKPHOS 52 08/08/2009   AST 29 08/08/2009   ALT 21 08/08/2009   BILITOT 0.5 08/08/2009       Last optometry/ophthalmology exam reviewed from:    Assessment:   Encounter Diagnoses  Name Primary?  . Diabetes mellitus Yes  . HYPERLIPIDEMIA, MIXED   . HYPERTENSIVE CARDIOVASCULAR DISEASE, BENIGN   . CORONARY ATHEROSCLEROSIS NATIVE CORONARY ARTERY   . Obesity (BMI 30-39.9)   . Encounter for long-term (current) use of other medications          Plan:    1.  Rx changes: none 2.  Education: Reviewed 'ABCs' of diabetes management (respective goals in parentheses):  A1C (<7), blood pressure (<130/80), and cholesterol (LDL <100). 3.  Compliance at present is estimated to be poor. Efforts to improve compliance (if necessary) will be directed at regular blood sugar monitoring: daily. 4. Follow up:4 months

## 2013-01-17 ENCOUNTER — Other Ambulatory Visit: Payer: Self-pay | Admitting: Family Medicine

## 2013-01-26 ENCOUNTER — Emergency Department (HOSPITAL_COMMUNITY)
Admission: EM | Admit: 2013-01-26 | Discharge: 2013-01-26 | Disposition: A | Discharge status: Home / Self Care | Payer: 59 | Attending: Emergency Medicine | Admitting: Emergency Medicine

## 2013-01-26 ENCOUNTER — Emergency Department (HOSPITAL_COMMUNITY): Payer: 59

## 2013-01-26 ENCOUNTER — Encounter (HOSPITAL_COMMUNITY): Payer: Self-pay | Admitting: Emergency Medicine

## 2013-01-26 ENCOUNTER — Telehealth (HOSPITAL_COMMUNITY): Payer: Self-pay | Admitting: Emergency Medicine

## 2013-01-26 DIAGNOSIS — R739 Hyperglycemia, unspecified: Secondary | ICD-10-CM

## 2013-01-26 DIAGNOSIS — E119 Type 2 diabetes mellitus without complications: Secondary | ICD-10-CM | POA: Insufficient documentation

## 2013-01-26 DIAGNOSIS — Z7902 Long term (current) use of antithrombotics/antiplatelets: Secondary | ICD-10-CM | POA: Insufficient documentation

## 2013-01-26 DIAGNOSIS — R112 Nausea with vomiting, unspecified: Secondary | ICD-10-CM | POA: Insufficient documentation

## 2013-01-26 DIAGNOSIS — R42 Dizziness and giddiness: Secondary | ICD-10-CM

## 2013-01-26 DIAGNOSIS — I10 Essential (primary) hypertension: Secondary | ICD-10-CM | POA: Insufficient documentation

## 2013-01-26 DIAGNOSIS — Z79899 Other long term (current) drug therapy: Secondary | ICD-10-CM | POA: Insufficient documentation

## 2013-01-26 DIAGNOSIS — R7309 Other abnormal glucose: Secondary | ICD-10-CM | POA: Insufficient documentation

## 2013-01-26 DIAGNOSIS — M129 Arthropathy, unspecified: Secondary | ICD-10-CM | POA: Insufficient documentation

## 2013-01-26 DIAGNOSIS — Z7982 Long term (current) use of aspirin: Secondary | ICD-10-CM | POA: Insufficient documentation

## 2013-01-26 DIAGNOSIS — Z8639 Personal history of other endocrine, nutritional and metabolic disease: Secondary | ICD-10-CM | POA: Insufficient documentation

## 2013-01-26 DIAGNOSIS — H9312 Tinnitus, left ear: Secondary | ICD-10-CM

## 2013-01-26 DIAGNOSIS — H9319 Tinnitus, unspecified ear: Secondary | ICD-10-CM | POA: Insufficient documentation

## 2013-01-26 DIAGNOSIS — Z791 Long term (current) use of non-steroidal anti-inflammatories (NSAID): Secondary | ICD-10-CM | POA: Insufficient documentation

## 2013-01-26 DIAGNOSIS — Z862 Personal history of diseases of the blood and blood-forming organs and certain disorders involving the immune mechanism: Secondary | ICD-10-CM | POA: Insufficient documentation

## 2013-01-26 DIAGNOSIS — I2510 Atherosclerotic heart disease of native coronary artery without angina pectoris: Secondary | ICD-10-CM | POA: Insufficient documentation

## 2013-01-26 DIAGNOSIS — Z8669 Personal history of other diseases of the nervous system and sense organs: Secondary | ICD-10-CM | POA: Insufficient documentation

## 2013-01-26 DIAGNOSIS — Z8739 Personal history of other diseases of the musculoskeletal system and connective tissue: Secondary | ICD-10-CM | POA: Insufficient documentation

## 2013-01-26 LAB — CBC WITH DIFFERENTIAL/PLATELET
Eosinophils Absolute: 0.1 10*3/uL (ref 0.0–0.7)
Eosinophils Relative: 1 % (ref 0–5)
HCT: 43.3 % (ref 39.0–52.0)
Hemoglobin: 14.5 g/dL (ref 13.0–17.0)
Lymphs Abs: 2.4 10*3/uL (ref 0.7–4.0)
MCH: 31.6 pg (ref 26.0–34.0)
MCV: 94.3 fL (ref 78.0–100.0)
Monocytes Absolute: 0.6 10*3/uL (ref 0.1–1.0)
Monocytes Relative: 4 % (ref 3–12)
Platelets: 214 10*3/uL (ref 150–400)
RBC: 4.59 MIL/uL (ref 4.22–5.81)

## 2013-01-26 LAB — URINALYSIS, ROUTINE W REFLEX MICROSCOPIC
Bilirubin Urine: NEGATIVE
Glucose, UA: >1000 mg/dL — AB
Hgb urine dipstick: NEGATIVE
Protein, ur: NEGATIVE mg/dL
Urobilinogen, UA: 1 mg/dL (ref 0.0–1.0)

## 2013-01-26 LAB — BASIC METABOLIC PANEL
BUN: 18 mg/dL (ref 6–23)
CO2: 30 mEq/L (ref 19–32)
Calcium: 10.3 mg/dL (ref 8.4–10.5)
Creatinine, Ser: 1.23 mg/dL (ref 0.50–1.35)
GFR calc non Af Amer: 64 mL/min — ABNORMAL LOW (ref >90)
Glucose, Bld: 301 mg/dL — ABNORMAL HIGH (ref 70–99)

## 2013-01-26 MED ORDER — LORAZEPAM 1 MG PO TABS
1.0000 mg | ORAL_TABLET | Freq: Once | ORAL | Status: AC
  Administered 2013-01-26: 1 mg via ORAL
  Filled 2013-01-26: qty 1

## 2013-01-26 MED ORDER — LORAZEPAM 1 MG PO TABS: 1.0000 mg | ORAL_TABLET | Freq: Three times a day (TID) | ORAL | Status: DC | PRN

## 2013-01-26 NOTE — ED Notes (Signed)
Onset dizziness and left ear ringing 6 days ago. Dizziness and ear ringing have been constant. Denies SOB, chest pain, headache. States dizziness has become worse over the past couple of days. Pt saw PCP this morning, and PCP told him he needs to have a CT scan done.

## 2013-01-26 NOTE — ED Provider Notes (Signed)
History     CSN: [id number]  Arrival date & time 01/26/13  0865   First MD Initiated Contact with Patient 01/26/13 1830      Chief Complaint  Patient presents with  . Dizziness    (Consider location/radiation/quality/duration/timing/severity/associated sxs/prior treatment) HPI Patient presents with concern of worsening nausea, disequilibrium, new tinnitus in the left ear.  The patient notes that he has prior episodes of vertigo in the distant past, but none have included left ear tinnitus, nor have any included significant nausea with vomiting or falls. He states that this episode of disequilibrium and nausea began subacutely at least one week ago.  Since onset symptoms have progressed, rapidly in the past 2 days, tinnitus.  Since the onset of symptoms he has tried meclizine, one other medication, with no relief from his symptoms. He denies confusion, disorientation, weakness, but endorses nausea with multiple episodes of emesis, as well as falls. He has physical pain, but does endorse ringing in the left ear with no loss of hearing capacity. His no history of diabetes, hypertension.  Past Medical History  Diagnosis Date  . Diabetes mellitus type 2  . CAD S/P percutaneous coronary angioplasty   . OSA (obstructive sleep apnea)   . Hypertension   . Metabolic syndrome   . Arthritis     Past Surgical History  Procedure Laterality Date  . Tonsillectomy      Family History  Problem Relation Age of Onset  . Diabetes Mother   . Heart attack Father     History  Substance Use Topics  . Smoking status: Never Smoker   . Smokeless tobacco: Never Used  . Alcohol Use: No      Review of Systems  All other systems reviewed and are negative.    Allergies  Review of patient's allergies indicates no known allergies.  Home Medications   Current Outpatient Rx  Name  Route  Sig  Dispense  Refill  . Alogliptin-Metformin HCl (KAZANO) 12.02-999 MG TABS   Oral   Take 1 capsule  by mouth 2 (two) times daily.   60 tablet   5   . aspirin 81 MG tablet   Oral   Take 81 mg by mouth daily.           . clopidogrel (PLAVIX) 75 MG tablet   Oral   Take 75 mg by mouth daily.         . colesevelam (WELCHOL) 625 MG tablet   Oral   Take 1,875 mg by mouth 2 (two) times daily with a meal.         . ezetimibe (ZETIA) 10 MG tablet   Oral   Take 10 mg by mouth daily.         . hydrochlorothiazide (HYDRODIURIL) 25 MG tablet   Oral   Take 25 mg by mouth daily.         . meloxicam (MOBIC) 15 MG tablet   Oral   Take 15 mg by mouth daily.         . quinapril (ACCUPRIL) 40 MG tablet   Oral   Take 40 mg by mouth at bedtime.         . rosuvastatin (CRESTOR) 40 MG tablet   Oral   Take 20 mg by mouth daily.         Marland Kitchen EXPIRED: vardenafil (LEVITRA) 20 MG tablet   Oral   Take 1 tablet (20 mg total) by mouth daily as needed for erectile dysfunction.  6 tablet   11     BP 154/92  Pulse 73  Temp(Src) 98.4 F (36.9 C) (Oral)  Resp 22  SpO2 96%  Physical Exam  Nursing note and vitals reviewed. Constitutional: He is oriented to person, place, and time. He appears well-developed. No distress.  HENT:  Head: Normocephalic and atraumatic.  Right Ear: Hearing, tympanic membrane, external ear and ear canal normal.  Left Ear: Hearing, tympanic membrane, external ear and ear canal normal.  Eyes: Conjunctivae and EOM are normal.  Cardiovascular: Normal rate and regular rhythm.   Pulmonary/Chest: Effort normal. No stridor. No respiratory distress.  Abdominal: He exhibits no distension.  Musculoskeletal: He exhibits no edema.  Neurological: He is alert and oriented to person, place, and time. He displays no atrophy and no tremor. No cranial nerve deficit or sensory deficit. He exhibits normal muscle tone. He displays no seizure activity. Gait abnormal. Coordination normal.  Skin: Skin is warm and dry.  Psychiatric: He has a normal mood and affect.    ED  Course  Procedures (including critical care time)  Labs Reviewed  CBC WITH DIFFERENTIAL  BASIC METABOLIC PANEL  URINALYSIS, ROUTINE W REFLEX MICROSCOPIC   No results found.   No diagnosis found.  Pulse ox 99% room air normal  9:15 PM Patient appears more calm.  He notes that his symptoms began after taking medicine on an empty stomach. Stasis feels better. We discussed, at length, the possibilities of his dizziness and tinnitus, including effects of hyperglycemia, effects of medication, possible neurologic changes identifiable on MRI.  Absent distress, with the patient's improvement, he was appropriate for discharge with follow up with his primary care physician tomorrow for both consideration of additional imaging, additional medication changes given his hyperglycemia. MDM  This patient with multiple prior episodes of vertigo now presents with ongoing disequilibrium as well as the left ear tinnitus.  On exam the patient is awake alert, appropriately interactive.  There no cerebellar deficits, but the patient is a mildly unsteady gait. The patient's evaluation is largely unremarkable aside from mild leukocytosis, hyperglycemia with glucosuria.  Head CT after the patient improved with Ativan, and vital signs remains stable he was discharged in stable condition to follow up with his primary care physician for additional consideration of causes of his dizziness/tinnitus, including, but not limited to medication effects, hyperglycemia, early infection, vestibular neuritis.        Gerhard Munch, MD 01/26/13 2118

## 2013-01-27 ENCOUNTER — Other Ambulatory Visit (HOSPITAL_COMMUNITY): Payer: Self-pay | Admitting: Family Medicine

## 2013-01-27 DIAGNOSIS — H905 Unspecified sensorineural hearing loss: Secondary | ICD-10-CM

## 2013-01-30 ENCOUNTER — Other Ambulatory Visit: Payer: Self-pay | Admitting: Family Medicine

## 2013-01-31 ENCOUNTER — Ambulatory Visit (HOSPITAL_COMMUNITY): Payer: 59

## 2013-02-09 ENCOUNTER — Ambulatory Visit (HOSPITAL_COMMUNITY): Payer: 59 | Attending: Family Medicine

## 2013-02-14 ENCOUNTER — Other Ambulatory Visit: Payer: Self-pay | Admitting: Family Medicine

## 2013-03-14 ENCOUNTER — Other Ambulatory Visit: Payer: Self-pay | Admitting: Family Medicine

## 2013-04-08 ENCOUNTER — Other Ambulatory Visit: Payer: Self-pay | Admitting: Family Medicine

## 2013-04-27 ENCOUNTER — Encounter: Payer: Self-pay | Admitting: Family Medicine

## 2013-04-27 ENCOUNTER — Ambulatory Visit (INDEPENDENT_AMBULATORY_CARE_PROVIDER_SITE_OTHER): Payer: 59 | Admitting: Family Medicine

## 2013-04-27 VITALS — BP 140/80 | HR 83 | Wt 248.0 lb

## 2013-04-27 DIAGNOSIS — E669 Obesity, unspecified: Secondary | ICD-10-CM

## 2013-04-27 DIAGNOSIS — Z7189 Other specified counseling: Secondary | ICD-10-CM

## 2013-04-27 DIAGNOSIS — E119 Type 2 diabetes mellitus without complications: Secondary | ICD-10-CM

## 2013-04-27 DIAGNOSIS — E782 Mixed hyperlipidemia: Secondary | ICD-10-CM

## 2013-04-27 DIAGNOSIS — Z719 Counseling, unspecified: Secondary | ICD-10-CM

## 2013-04-27 LAB — POCT GLYCOSYLATED HEMOGLOBIN (HGB A1C): Hemoglobin A1C: 12.2

## 2013-04-27 NOTE — Progress Notes (Signed)
Subjective:    Philip Black is a 57 y.o. male who presents for follow-up of Type 2 diabetes mellitus.    Home blood sugar records: high 400 low 118  Current symptoms/problems include : urinating a lot /thurst he states is now better. Daily foot checks, foot concerns: yes/no Last eye exam:  05/26/12   Medication compliance: Good Current diet: Very erratic Current exercise: walking lifting/ weight . Exercising on treadmill 3 times per week for half-an-hour. Known diabetic complications: none Cardiovascular risk factors: diabetes mellitus, dyslipidemia, hypertension, male gender and obesity (BMI >= 30 kg/m2) He started having difficulty with his blood sugars when his sister died. This was in late 2023/03/02. Since then he notes he has spiraled out of control in regard to his physical activities and eating habits. He is still having some difficulty dealing with his sister's death. There are some financial concerns between him and his siblings.  The following portions of the patient's history were reviewed and updated as appropriate: allergies, current medications, past family history, past medical history, past social history and problem list.  ROS as in subjective above    Objective:    BP 140/80  Pulse 83  Wt 248 lb (112.492 kg)  BMI 35.58 kg/m2  Filed Vitals:   04/27/13 0835  BP: 140/80  Pulse: 83    General appearence: alert, no distress, WD/WN Neck: supple, no lymphadenopathy, no thyromegaly, no masses Heart: RRR, normal S1, S2, no murmurs Lungs: CTA bilaterally, no wheezes, rhonchi, or rales Abdomen: +bs, soft, non tender, non distended, no masses, no hepatomegaly, no splenomegaly Pulses: 2+ symmetric, upper and lower extremities, normal cap refill Ext: no edema Foot exam:  Neuro: foot monofilament exam normal   Lab Review Lab Results  Component Value Date   HGBA1C 7.9 12/23/2012   Lab Results  Component Value Date   CHOL 215* 12/23/2012   HDL 67 12/23/2012   LDLCALC  133* 12/23/2012   LDLDIRECT 64 08/08/2009   TRIG 77 12/23/2012   CHOLHDL 3.2 12/23/2012   No results found for this basenameConcepcion Elk     Chemistry      Component Value Date/Time   NA 137 01/26/2013 1910   K 4.1 01/26/2013 1910   CL 99 01/26/2013 1910   CO2 30 01/26/2013 1910   BUN 18 01/26/2013 1910   CREATININE 1.23 01/26/2013 1910   CREATININE 1.12 12/23/2012 0944      Component Value Date/Time   CALCIUM 10.3 01/26/2013 1910   ALKPHOS 43 12/23/2012 0944   AST 29 12/23/2012 0944   ALT 16 12/23/2012 0944   BILITOT 0.7 12/23/2012 0944        Chemistry      Component Value Date/Time   NA 137 01/26/2013 1910   K 4.1 01/26/2013 1910   CL 99 01/26/2013 1910   CO2 30 01/26/2013 1910   BUN 18 01/26/2013 1910   CREATININE 1.23 01/26/2013 1910   CREATININE 1.12 12/23/2012 0944      Component Value Date/Time   CALCIUM 10.3 01/26/2013 1910   ALKPHOS 43 12/23/2012 0944   AST 29 12/23/2012 0944   ALT 16 12/23/2012 0944   BILITOT 0.7 12/23/2012 0944      HbA1C 12.2   Last optometry/ophthalmology exam reviewed from:    Assessment:  Diabetes mellitus - Plan: POCT glycosylated hemoglobin (Hb A1C)  HYPERLIPIDEMIA, MIXED  Obesity (BMI 30-39.9)  Bereavement counseling    Plan:    1.  Rx changes: none 2.  Education: Reviewed '  ABCs' of diabetes management (respective goals in parentheses):  A1C (<7), blood pressure (<130/80), and cholesterol (LDL <100). 3.  Compliance at present is estimated to be poor. Efforts to improve compliance (if necessary) will be directed at dietary modifications: He will be referred back to the nutritionist. 4. Follow up: 4 months   I discussed the 3 variables in regard to diabetes. His medications have been constant however his eating habits and activity habits have not. Recommend he exercise daily for half an hour. I will also refer him back to dietitian for counseling. Also discussed bereavement with him. I gave him permission to have all the symptoms  that he was having I also recommended that he call [facility]osp[facility] to get involved in her bereavement counseling.

## 2013-04-27 NOTE — Patient Instructions (Signed)
Exercise daily for half an hour. Check your blood sugars either before or 2 hours after a meal. We will set you up for the nutritionist. Lying the information from your previous visit to the nutritionist. Ignore your little  Sister!  If your blood sugars remain high don't hesitate to call me. Call [facility]osp[facility] [phone] for bereavement counseling

## 2013-05-08 ENCOUNTER — Other Ambulatory Visit: Payer: Self-pay | Admitting: Family Medicine

## 2013-05-09 NOTE — Telephone Encounter (Signed)
Is this ok?

## 2013-06-01 ENCOUNTER — Encounter: Payer: 59 | Admitting: Cardiovascular Disease

## 2013-06-01 NOTE — Progress Notes (Signed)
No show for appt. cdm  

## 2013-06-10 ENCOUNTER — Encounter: Payer: Self-pay | Admitting: Cardiovascular Disease

## 2013-07-06 ENCOUNTER — Other Ambulatory Visit: Payer: Self-pay | Admitting: Cardiovascular Disease

## 2013-07-20 ENCOUNTER — Encounter: Payer: Self-pay | Admitting: Physician Assistant

## 2013-07-20 ENCOUNTER — Ambulatory Visit (INDEPENDENT_AMBULATORY_CARE_PROVIDER_SITE_OTHER): Payer: 59 | Admitting: Physician Assistant

## 2013-07-20 VITALS — BP 148/92 | HR 88 | Ht 70.0 in | Wt 247.0 lb

## 2013-07-20 DIAGNOSIS — I152 Hypertension secondary to endocrine disorders: Secondary | ICD-10-CM

## 2013-07-20 DIAGNOSIS — R079 Chest pain, unspecified: Secondary | ICD-10-CM

## 2013-07-20 DIAGNOSIS — E1169 Type 2 diabetes mellitus with other specified complication: Secondary | ICD-10-CM

## 2013-07-20 DIAGNOSIS — E669 Obesity, unspecified: Secondary | ICD-10-CM

## 2013-07-20 DIAGNOSIS — E1159 Type 2 diabetes mellitus with other circulatory complications: Secondary | ICD-10-CM

## 2013-07-20 DIAGNOSIS — E782 Mixed hyperlipidemia: Secondary | ICD-10-CM

## 2013-07-20 DIAGNOSIS — E119 Type 2 diabetes mellitus without complications: Secondary | ICD-10-CM

## 2013-07-20 DIAGNOSIS — I10 Essential (primary) hypertension: Secondary | ICD-10-CM

## 2013-07-20 NOTE — Progress Notes (Signed)
HPI:  This is a 57 yo male patient of Dr. Clifton James with history of CAD, HTN, HLD, DM, OSA here today for cardiac follow up. He has been followed in the past by Dr. Juanda Chance and has not been seen in this office since September 2011. In May 2009 he had an anterior MI treated with a drug-eluting stent to the LAD. He had residual 80% narrowing in the posterolateral branch and 90% narrowing of the marginal branch the circumflex artery which was managed medically. Is not on beta blocker secondary to bradycardia and fatigue in the past. He is been maintained on aspirin/statin/Plavix/ACE inhibitor. Blood pressure and lipids have been controlled according to patient.  Patient comes in today because his wife scheduled the appointment after an episode of chest pain. He said 3 weeks ago he was at the gym doing bench presses. He became distracted and the machine fell backward quickly on him. He says he was very sore in his chest and it hurt to take a deep breath or when he moved his arms. 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 He says the pain lasted about a week and it was tender to touch in his chest. He said it was nothing like his MI pain when his left arm was numb and tingling. He denies any chest tightness, pressure, dyspnea, dyspnea on exertion, left arm pain, dizziness, or presyncope. The pain has completely resolved. He went back to lifting weights in the gym and has no symptoms. He is also working without any problem. He works out 4 times a week without any symptoms.  Primary Care Physician: Sharlot Gowda  No Known Allergies  Current Outpatient Prescriptions on File Prior to Visit: aspirin 81 MG tablet, Take 81 mg by mouth daily.  , Disp: , Rfl:  clopidogrel (PLAVIX) 75 MG tablet, Take 75 mg by mouth daily., Disp: , Rfl:   clopidogrel (PLAVIX) 75 MG tablet, TAKE 1 TABLET EVERY DAY, Disp: 30 tablet, Rfl: 0 colesevelam (WELCHOL) 625 MG tablet, Take 1,875 mg by  mouth 2 (two) times daily with a meal., Disp: , Rfl:  CRESTOR 40 MG tablet, TAKE 1/2 TABLET DAILY, Disp: 15 tablet, Rfl: 3 ezetimibe (ZETIA) 10 MG tablet, Take 10 mg by mouth daily., Disp: , Rfl:  hydrochlorothiazide (HYDRODIURIL) 25 MG tablet, Take 25 mg by mouth daily., Disp: , Rfl:  KAZANO 12.02-999 MG TABS, TAKE 1 CAPSULE BY MOUTH 2 (TWO) TIMES DAILY., Disp: 60 tablet, Rfl: 5 LEVITRA 20 MG tablet, TAKE 1 TABLET (20 MG TOTAL) BY MOUTH DAILY AS NEEDED FOR ERECTILE DYSFUNCTION., Disp: 6 tablet, Rfl: 6 meloxicam (MOBIC) 15 MG tablet, Take 15 mg by mouth daily., Disp: , Rfl:  pioglitazone (ACTOS) 45 MG tablet, TAKE 1 TABLET EVERY DAY, Disp: 30 tablet, Rfl: 5 quinapril (ACCUPRIL) 40 MG tablet, TAKE 1 TABLET EVERY DAY FOR BLOOD PRESSURE, Disp: 30 tablet, Rfl: 5 rosuvastatin (CRESTOR) 40 MG tablet, Take 20 mg by mouth daily., Disp: , Rfl:   No current facility-administered medications on file prior to visit.   Past Medical History:   Diabetes mellitus                               type 2       CAD S/P percutaneous coronary angioplasty                    OSA (obstructive  sleep apnea)                                Hypertension                                                 Metabolic syndrome                                           Arthritis                                                   Past Surgical History:   TONSILLECTOMY                                                Review of patient's family history indicates:   Diabetes                       Mother                   Heart attack                   Father                   Social History   Marital Status: Married             Spouse Name:                      Years of Education:                 Number of children: 3           Occupational History 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 Neck: No JVD, HJR,  Bruit, or thyroid enlargement  Lungs: No tachypnea, clear without wheezing, rales, or rhonchi  Cardiovascular: RRR, PMI not displaced, positive S4 and 2/6 systolic murmur at the left sternal border, no bruit, thrill, or heave.  Abdomen: BS normal. Soft without organomegaly, masses, lesions or tenderness.  Extremities: without cyanosis, clubbing or edema. Good distal pulses bilateral  SKin: Warm, no lesions or rashes   Musculoskeletal: No deformities  Neuro: no focal signs  BP 148/92  Pulse 88  Ht 5\' 10"  (1.778 m)  Wt 247 lb (112.038 kg)  BMI 35.44 kg/m2  SpO2 96%    EKG: Normal sinus rhythm, normal EKG

## 2013-07-20 NOTE — Assessment & Plan Note (Signed)
Recommend continued weight loss.

## 2013-07-20 NOTE — Assessment & Plan Note (Signed)
Controlled.  

## 2013-07-20 NOTE — Assessment & Plan Note (Signed)
Followed closely by Dr. Susann Givens

## 2013-07-20 NOTE — Patient Instructions (Signed)
Your physician wants you to follow-up in: 1 year with Dr. Lisette Grinder will receive a reminder letter in the mail two months in advance. If you don't receive a letter, please call our office to schedule the follow-up appointment.  Your physician recommends that you continue on your current medications as directed. Please refer to the Current Medication list given to you today.  If you are having any more problems you can call office sooner [phone]

## 2013-07-20 NOTE — Assessment & Plan Note (Signed)
Monitored by Dr. Susann Givens.

## 2013-07-20 NOTE — Assessment & Plan Note (Signed)
Patient had an episode of chest pain after an incident at the gym while lifting heavy weights. The symptoms seem to be musculoskeletal related and have resolved. He is exercising regularly without any chest pain. EKG is normal. No further cardiac workup at this time. Follow up with Dr. Clifton James next August.

## 2013-08-03 ENCOUNTER — Other Ambulatory Visit: Payer: Self-pay | Admitting: Family Medicine

## 2013-08-10 ENCOUNTER — Other Ambulatory Visit: Payer: Self-pay | Admitting: Cardiovascular Disease

## 2013-08-10 ENCOUNTER — Other Ambulatory Visit: Payer: Self-pay | Admitting: Family Medicine

## 2013-08-11 ENCOUNTER — Other Ambulatory Visit: Payer: Self-pay

## 2013-08-24 ENCOUNTER — Ambulatory Visit: Payer: 59 | Admitting: Family Medicine

## 2013-09-04 ENCOUNTER — Other Ambulatory Visit: Payer: Self-pay | Admitting: Family Medicine

## 2013-09-15 ENCOUNTER — Ambulatory Visit: Payer: 59 | Admitting: Family Medicine

## 2013-09-18 ENCOUNTER — Other Ambulatory Visit: Payer: Self-pay | Admitting: Family Medicine

## 2013-10-18 ENCOUNTER — Ambulatory Visit: Payer: 59 | Admitting: Family Medicine

## 2013-10-19 ENCOUNTER — Other Ambulatory Visit: Payer: Self-pay | Admitting: Family Medicine

## 2013-10-24 ENCOUNTER — Encounter: Payer: Self-pay | Admitting: Family Medicine

## 2013-10-24 ENCOUNTER — Ambulatory Visit (INDEPENDENT_AMBULATORY_CARE_PROVIDER_SITE_OTHER): Payer: 59 | Admitting: Family Medicine

## 2013-10-24 ENCOUNTER — Ambulatory Visit: Payer: 59 | Admitting: Family Medicine

## 2013-10-24 VITALS — BP 150/90 | HR 72 | Wt 253.0 lb

## 2013-10-24 DIAGNOSIS — G4733 Obstructive sleep apnea (adult) (pediatric): Secondary | ICD-10-CM

## 2013-10-24 DIAGNOSIS — Z9119 Patient's noncompliance with other medical treatment and regimen: Secondary | ICD-10-CM

## 2013-10-24 DIAGNOSIS — E669 Obesity, unspecified: Secondary | ICD-10-CM

## 2013-10-24 DIAGNOSIS — E119 Type 2 diabetes mellitus without complications: Secondary | ICD-10-CM

## 2013-10-24 DIAGNOSIS — E782 Mixed hyperlipidemia: Secondary | ICD-10-CM

## 2013-10-24 DIAGNOSIS — Z91199 Patient's noncompliance with other medical treatment and regimen due to unspecified reason: Secondary | ICD-10-CM

## 2013-10-24 DIAGNOSIS — E1159 Type 2 diabetes mellitus with other circulatory complications: Secondary | ICD-10-CM

## 2013-10-24 DIAGNOSIS — Z79899 Other long term (current) drug therapy: Secondary | ICD-10-CM

## 2013-10-24 DIAGNOSIS — G5710 Meralgia paresthetica, unspecified lower limb: Secondary | ICD-10-CM

## 2013-10-24 DIAGNOSIS — I10 Essential (primary) hypertension: Secondary | ICD-10-CM

## 2013-10-24 DIAGNOSIS — E1169 Type 2 diabetes mellitus with other specified complication: Secondary | ICD-10-CM

## 2013-10-24 DIAGNOSIS — I152 Hypertension secondary to endocrine disorders: Secondary | ICD-10-CM

## 2013-10-24 DIAGNOSIS — G5711 Meralgia paresthetica, right lower limb: Secondary | ICD-10-CM

## 2013-10-24 LAB — CBC WITH DIFFERENTIAL/PLATELET
BASOS ABS: 0 10*3/uL (ref 0.0–0.1)
Basophils Relative: 0 % (ref 0–1)
EOS ABS: 0.2 10*3/uL (ref 0.0–0.7)
EOS PCT: 2 % (ref 0–5)
HCT: 43.4 % (ref 39.0–52.0)
Hemoglobin: 14.3 g/dL (ref 13.0–17.0)
LYMPHS PCT: 29 % (ref 12–46)
Lymphs Abs: 2.6 10*3/uL (ref 0.7–4.0)
MCH: 30.4 pg (ref 26.0–34.0)
MCHC: 32.9 g/dL (ref 30.0–36.0)
MCV: 92.1 fL (ref 78.0–100.0)
Monocytes Absolute: 0.4 10*3/uL (ref 0.1–1.0)
Monocytes Relative: 5 % (ref 3–12)
Neutro Abs: 5.8 10*3/uL (ref 1.7–7.7)
Neutrophils Relative %: 64 % (ref 43–77)
PLATELETS: 210 10*3/uL (ref 150–400)
RBC: 4.71 MIL/uL (ref 4.22–5.81)
RDW: 13.4 % (ref 11.5–15.5)
WBC: 8.9 10*3/uL (ref 4.0–10.5)

## 2013-10-24 LAB — COMPREHENSIVE METABOLIC PANEL
ALT: 13 U/L (ref 0–53)
AST: 17 U/L (ref 0–37)
Albumin: 4.1 g/dL (ref 3.5–5.2)
Alkaline Phosphatase: 43 U/L (ref 39–117)
BILIRUBIN TOTAL: 0.6 mg/dL (ref 0.3–1.2)
BUN: 17 mg/dL (ref 6–23)
CHLORIDE: 99 meq/L (ref 96–112)
CO2: 27 mEq/L (ref 19–32)
CREATININE: 0.98 mg/dL (ref 0.50–1.35)
Calcium: 9.3 mg/dL (ref 8.4–10.5)
Glucose, Bld: 251 mg/dL — ABNORMAL HIGH (ref 70–99)
Potassium: 4.1 mEq/L (ref 3.5–5.3)
Sodium: 135 mEq/L (ref 135–145)
Total Protein: 6.6 g/dL (ref 6.0–8.3)

## 2013-10-24 LAB — LIPID PANEL
CHOL/HDL RATIO: 2.2 ratio
Cholesterol: 137 mg/dL (ref 0–200)
HDL: 62 mg/dL (ref >39)
LDL CALC: 63 mg/dL (ref 0–99)
Triglycerides: 60 mg/dL (ref <150)
VLDL: 12 mg/dL (ref 0–40)

## 2013-10-24 LAB — POCT GLYCOSYLATED HEMOGLOBIN (HGB A1C): HEMOGLOBIN A1C: 11.7

## 2013-10-24 NOTE — Addendum Note (Signed)
Addended by: Lise Auer on: 10/24/2013 11:48 AM   Modules accepted: Orders

## 2013-10-24 NOTE — Progress Notes (Signed)
   Subjective:    Patient ID: Philip Black, male    DOB: 03-01-1956, 58 y.o.   MRN: [id number]  HPI He is here for recheck. He admits to not checking his blood sugars regularly. He does continue on his medications. He is not using his CPAP machine and has not used it in quite some time stating that he was choking him. 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 Smoking and drinking were reviewed. He does complain of some right hip tingling in the area of ASIS especially when he touches that area. He has had no difficulty with chest pain, shortness of breath, DOE.  Review of Systems     Objective:   Physical Exam Alert and in no distress. Hemoglobin A1c is 11.7.       Assessment & Plan:  Personal history of noncompliance with medical treatment, presenting hazards to health  Meralgia paresthetica of right side  Diabetes mellitus  HYPERLIPIDEMIA, MIXED  Hypertension associated with diabetes  Obesity (BMI 30-39.9)  OSA (obstructive sleep apnea)  discuss the fact that he is noncompliant with his overall care and the risks to his health including stroke, blindness, heart disease, kidney failure and vascular damage. Encouraged him to check his blood sugars are much more regularly. Discussed starting him on insulin however he would like to wait. Explained that I will wait till his next visit. Also discussed the diagnosis of meralgia paresthetica and its therapy. Again this is a potentially weight related issue as well. Over 30 minutes spent discussing all these issues with him. He will return here in 4 months. Blood work was drawn. He might possibly be referred for a research protocol for his lipids. Also discussed his sleep apnea in regard to maintaining his CDL license.

## 2013-11-04 ENCOUNTER — Other Ambulatory Visit: Payer: Self-pay | Admitting: Family Medicine

## 2013-11-27 ENCOUNTER — Other Ambulatory Visit: Payer: Self-pay | Admitting: Family Medicine

## 2013-12-09 ENCOUNTER — Other Ambulatory Visit: Payer: Self-pay | Admitting: Family Medicine

## 2013-12-09 NOTE — Telephone Encounter (Signed)
Forwarding to you :)

## 2013-12-09 NOTE — Telephone Encounter (Signed)
Medication sent in. 

## 2013-12-17 ENCOUNTER — Other Ambulatory Visit: Payer: Self-pay | Admitting: Family Medicine

## 2013-12-27 ENCOUNTER — Other Ambulatory Visit: Payer: Self-pay | Admitting: Family Medicine

## 2014-01-12 ENCOUNTER — Telehealth: Payer: Self-pay | Admitting: Internal Medicine

## 2014-01-12 NOTE — Telephone Encounter (Signed)
Faxed over medical records to [facility] on march 19th @ [phone]

## 2014-02-08 LAB — HM DIABETES EYE EXAM

## 2014-02-27 ENCOUNTER — Other Ambulatory Visit: Payer: Self-pay | Admitting: Family Medicine

## 2014-03-02 ENCOUNTER — Encounter: Payer: Self-pay | Admitting: Family Medicine

## 2014-03-08 ENCOUNTER — Ambulatory Visit: Payer: 59 | Admitting: Family Medicine

## 2014-03-09 ENCOUNTER — Other Ambulatory Visit: Payer: Self-pay | Admitting: Family Medicine

## 2014-03-16 ENCOUNTER — Encounter: Payer: Self-pay | Admitting: Internal Medicine

## 2014-03-22 ENCOUNTER — Ambulatory Visit: Payer: 59 | Admitting: Family Medicine

## 2014-03-25 ENCOUNTER — Other Ambulatory Visit: Payer: Self-pay | Admitting: Family Medicine

## 2014-04-02 ENCOUNTER — Other Ambulatory Visit: Payer: Self-pay | Admitting: Family Medicine

## 2014-04-19 ENCOUNTER — Ambulatory Visit (INDEPENDENT_AMBULATORY_CARE_PROVIDER_SITE_OTHER): Payer: 59 | Admitting: Family Medicine

## 2014-04-19 ENCOUNTER — Encounter: Payer: Self-pay | Admitting: Family Medicine

## 2014-04-19 VITALS — BP 150/90 | HR 64 | Wt 246.0 lb

## 2014-04-19 DIAGNOSIS — I152 Hypertension secondary to endocrine disorders: Secondary | ICD-10-CM

## 2014-04-19 DIAGNOSIS — E782 Mixed hyperlipidemia: Secondary | ICD-10-CM

## 2014-04-19 DIAGNOSIS — T50904A Poisoning by unspecified drugs, medicaments and biological substances, undetermined, initial encounter: Secondary | ICD-10-CM

## 2014-04-19 DIAGNOSIS — N522 Drug-induced erectile dysfunction: Secondary | ICD-10-CM

## 2014-04-19 DIAGNOSIS — E1169 Type 2 diabetes mellitus with other specified complication: Secondary | ICD-10-CM

## 2014-04-19 DIAGNOSIS — I10 Essential (primary) hypertension: Secondary | ICD-10-CM

## 2014-04-19 DIAGNOSIS — E119 Type 2 diabetes mellitus without complications: Secondary | ICD-10-CM

## 2014-04-19 DIAGNOSIS — E1159 Type 2 diabetes mellitus with other circulatory complications: Secondary | ICD-10-CM

## 2014-04-19 DIAGNOSIS — E669 Obesity, unspecified: Secondary | ICD-10-CM

## 2014-04-19 DIAGNOSIS — N529 Male erectile dysfunction, unspecified: Secondary | ICD-10-CM

## 2014-04-19 LAB — POCT UA - MICROALBUMIN
ALBUMIN/CREATININE RATIO, URINE, POC: 238.7
Creatinine, POC: 98.5 mg/dL
Microalbumin Ur, POC: 235.1 mg/L

## 2014-04-19 LAB — POCT GLYCOSYLATED HEMOGLOBIN (HGB A1C): Hemoglobin A1C: 10.4

## 2014-04-19 MED ORDER — INSULIN DETEMIR 100 UNIT/ML ~~LOC~~ SOLN: 10.0000 [IU] | Freq: Every day | SUBCUTANEOUS | Status: DC

## 2014-04-19 MED ORDER — INSULIN PEN NEEDLE 32G X 4 MM MISC: 1.0000 | Freq: Every day | Status: DC

## 2014-04-19 MED ORDER — SILDENAFIL CITRATE 100 MG PO TABS: 100.0000 mg | ORAL_TABLET | Freq: Every day | ORAL | Status: DC | PRN

## 2014-04-19 MED ORDER — AMLODIPINE BESYLATE 5 MG PO TABS: 5.0000 mg | ORAL_TABLET | Freq: Every day | ORAL | Status: DC

## 2014-04-19 NOTE — Progress Notes (Signed)
Subjective:    Philip Black is a 58 y.o. male who presents for follow-up of Type 2 diabetes mellitus.  He also complains of difficulty getting and maintaining erections.  Home blood sugar records: 300 AVG. he states that they run between 2 and 400 but he did not call me concerning this.  Current symptoms/problems  PATIENT CAN NOT GET HIS SUGARS DOWN Daily foot checks:  Any foot concerns; NONE Last eye exam:  02/18/14   Medication compliance:  Poor Current diet: DRINKING WATER REALLY NOT EATING MUCH. He states that he has made some changes in cutting back on carbohydrates. His eating habits are quite erratic. Current exercise: WORK Known diabetic complications: impotence Cardiovascular risk factors: advanced age (older than 35 for men, 10 for women), diabetes mellitus, dyslipidemia, hypertension, male gender, obesity (BMI >= 30 kg/m2) and sedentary lifestyle vascular disease   The following portions of the patient's history were reviewed and updated as appropriate: allergies, current medications, past medical history, past social history and problem list.  ROS as in subjective above    Objective:    There were no vitals taken for this visit.  There were no vitals filed for this visit.  General appearence: alert, no distress, WD/WN Neck: supple, no lymphadenopathy, no thyromegaly, no masses Heart: RRR, normal S1, S2, no murmurs Lungs: CTA bilaterally, no wheezes, rhonchi, or rales Abdomen: +bs, soft, non tender, non distended, no masses, no hepatomegaly, no splenomegaly Pulses: 2+ symmetric, upper and lower extremities, normal cap refill Ext: no edema Foot exam:  Neuro: foot monofilament exam normal   Lab Review Lab Results  Component Value Date   HGBA1C 11.7 10/24/2013   Lab Results  Component Value Date   CHOL 137 10/24/2013   HDL 62 10/24/2013   LDLCALC 63 10/24/2013   LDLDIRECT 64 08/08/2009   TRIG 60 10/24/2013   CHOLHDL 2.2 10/24/2013   No results found for this  basenameDerl Barrow     Chemistry      Component Value Date/Time   NA 135 10/24/2013 1152   K 4.1 10/24/2013 1152   CL 99 10/24/2013 1152   CO2 27 10/24/2013 1152   BUN 17 10/24/2013 1152   CREATININE 0.98 10/24/2013 1152   CREATININE 1.23 01/26/2013 1910      Component Value Date/Time   CALCIUM 9.3 10/24/2013 1152   ALKPHOS 43 10/24/2013 1152   AST 17 10/24/2013 1152   ALT 13 10/24/2013 1152   BILITOT 0.6 10/24/2013 1152        Chemistry      Component Value Date/Time   NA 135 10/24/2013 1152   K 4.1 10/24/2013 1152   CL 99 10/24/2013 1152   CO2 27 10/24/2013 1152   BUN 17 10/24/2013 1152   CREATININE 0.98 10/24/2013 1152   CREATININE 1.23 01/26/2013 1910      Component Value Date/Time   CALCIUM 9.3 10/24/2013 1152   ALKPHOS 43 10/24/2013 1152   AST 17 10/24/2013 1152   ALT 13 10/24/2013 1152   BILITOT 0.6 10/24/2013 1152       Last optometry/ophthalmology exam reviewed from:    Assessment:   Encounter Diagnoses  Name Primary?  . Drug-induced erectile dysfunction   . Hypertension associated with diabetes   . HYPERLIPIDEMIA, MIXED   . Type 2 diabetes mellitus without complication Yes  . Obesity (BMI 30-39.9)          Plan:    1.  Rx changes: I will add amlodipine to his regimen  and start him on Levemir. 2.  Education: Reviewed 'ABCs' of diabetes management (respective goals in parentheses):  A1C (<7), blood pressure (<130/80), and cholesterol (LDL <100). 3.  Compliance at present is estimated to be poor. Efforts to improve compliance (if necessary) will be directed at Set him up to see nutritionist. 4. Follow up: 1 month  Overall he has been relatively noncompliant. I reinforced need for him to call me if his blood sugars go in the high 3 and 400 range. He'll be started on Levemir. He was given proper instructions on dosing. Recheck here in one month.

## 2014-04-19 NOTE — Patient Instructions (Signed)
Stay on all your present medications and we will add amlodipine and Lantus insulin. We will start with 10 units of the insulin. Increase it by 2 units every 2 days until your blood sugar is under 120 in the morning

## 2014-05-03 ENCOUNTER — Other Ambulatory Visit: Payer: Self-pay | Admitting: Family Medicine

## 2014-05-11 ENCOUNTER — Telehealth: Payer: Self-pay | Admitting: Family Medicine

## 2014-05-11 MED ORDER — INSULIN DETEMIR 100 UNIT/ML FLEXPEN: 10.0000 [IU] | Freq: Every day | SUBCUTANEOUS | Status: DC

## 2014-05-11 NOTE — Telephone Encounter (Signed)
Please call about his insulin.  He states we gave him insulin pen but when he picked up  RX it was for a bottle of insulin but he does not have needles or anything. HE is hoping he can stay with pen

## 2014-05-17 ENCOUNTER — Ambulatory Visit: Payer: 59 | Admitting: Family Medicine

## 2014-05-20 ENCOUNTER — Other Ambulatory Visit: Payer: Self-pay | Admitting: Family Medicine

## 2014-05-24 ENCOUNTER — Encounter: Payer: Self-pay | Admitting: Family Medicine

## 2014-05-31 ENCOUNTER — Other Ambulatory Visit: Payer: Self-pay | Admitting: Family Medicine

## 2014-06-07 ENCOUNTER — Ambulatory Visit: Payer: 59 | Admitting: Dietician

## 2014-06-11 ENCOUNTER — Other Ambulatory Visit: Payer: Self-pay | Admitting: Family Medicine

## 2014-07-11 ENCOUNTER — Other Ambulatory Visit: Payer: Self-pay | Admitting: Family Medicine

## 2014-07-12 ENCOUNTER — Other Ambulatory Visit: Payer: Self-pay | Admitting: Family Medicine

## 2014-07-19 ENCOUNTER — Ambulatory Visit: Payer: 59 | Admitting: *Deleted

## 2014-08-09 ENCOUNTER — Ambulatory Visit (INDEPENDENT_AMBULATORY_CARE_PROVIDER_SITE_OTHER): Payer: 59 | Admitting: Family Medicine

## 2014-08-09 ENCOUNTER — Encounter: Payer: Self-pay | Admitting: Family Medicine

## 2014-08-09 VITALS — BP 122/80 | HR 83 | Wt 241.0 lb

## 2014-08-09 DIAGNOSIS — E782 Mixed hyperlipidemia: Secondary | ICD-10-CM

## 2014-08-09 DIAGNOSIS — I10 Essential (primary) hypertension: Secondary | ICD-10-CM

## 2014-08-09 DIAGNOSIS — E1159 Type 2 diabetes mellitus with other circulatory complications: Secondary | ICD-10-CM

## 2014-08-09 DIAGNOSIS — Z9119 Patient's noncompliance with other medical treatment and regimen: Secondary | ICD-10-CM

## 2014-08-09 DIAGNOSIS — E119 Type 2 diabetes mellitus without complications: Secondary | ICD-10-CM

## 2014-08-09 DIAGNOSIS — E1165 Type 2 diabetes mellitus with hyperglycemia: Secondary | ICD-10-CM

## 2014-08-09 DIAGNOSIS — E1169 Type 2 diabetes mellitus with other specified complication: Secondary | ICD-10-CM

## 2014-08-09 DIAGNOSIS — E669 Obesity, unspecified: Secondary | ICD-10-CM

## 2014-08-09 DIAGNOSIS — IMO0002 Reserved for concepts with insufficient information to code with codable children: Secondary | ICD-10-CM

## 2014-08-09 DIAGNOSIS — Z91199 Patient's noncompliance with other medical treatment and regimen due to unspecified reason: Secondary | ICD-10-CM

## 2014-08-09 DIAGNOSIS — Z23 Encounter for immunization: Secondary | ICD-10-CM

## 2014-08-09 DIAGNOSIS — I152 Hypertension secondary to endocrine disorders: Secondary | ICD-10-CM

## 2014-08-09 LAB — POCT GLYCOSYLATED HEMOGLOBIN (HGB A1C): Hemoglobin A1C: 11.2

## 2014-08-09 NOTE — Patient Instructions (Signed)
Check your sugar in the morning when you get up and during the day either before a meal or preferably 2 hours after a meal. Send me an email on My Chart in one week with your blood sugar readings

## 2014-08-09 NOTE — Progress Notes (Signed)
Subjective:    Philip Black is a 58 y.o. male who presents for follow-up of Type 2 diabetes mellitus.    Home blood sugar records: PATIENT HAS NOT BEEN TESTING DUE TO METER BEING STOLEN.instead of using blood sugar as a measure, he has been using urinary frequency to increase his insulin. He is now on 50 units per day.  Current symptoms/problem BURNING IN RIGHT BIG TOE / NUMBNESS IN LEFT THIGH  Daily foot checks: Any foot concerns YES  As above Last eye exam:  02/18/14   Medication compliance:poor Current diet: NONE Current exercise: CARDIO SOME LIFTING  Known diabetic complications: cardiovascular disease Cardiovascular risk factors: advanced age (older than 41 for men, 15 for women), diabetes mellitus, dyslipidemia, hypertension, male gender, obesity (BMI >= 30 kg/m2) and sedentary lifestyle   The following portions of the patient's history were reviewed and updated as appropriate: allergies, current medications, past family history, past medical history, past social history and problem list.  ROS as in subjective above    Objective:    There were no vitals taken for this visit.  There were no vitals filed for this visit.  General appearence: alert, no distress, WD/WN    Lab Review Lab Results  Component Value Date   HGBA1C 10.4 04/19/2014   Lab Results  Component Value Date   CHOL 137 10/24/2013   HDL 62 10/24/2013   LDLCALC 63 10/24/2013   LDLDIRECT 64 08/08/2009   TRIG 60 10/24/2013   CHOLHDL 2.2 10/24/2013   No results found for: Derl Barrow   Chemistry      Component Value Date/Time   NA 135 10/24/2013 1152   K 4.1 10/24/2013 1152   CL 99 10/24/2013 1152   CO2 27 10/24/2013 1152   BUN 17 10/24/2013 1152   CREATININE 0.98 10/24/2013 1152   CREATININE 1.23 01/26/2013 1910      Component Value Date/Time   CALCIUM 9.3 10/24/2013 1152   ALKPHOS 43 10/24/2013 1152   AST 17 10/24/2013 1152   ALT 13 10/24/2013 1152   BILITOT 0.6 10/24/2013  1152        Chemistry      Component Value Date/Time   NA 135 10/24/2013 1152   K 4.1 10/24/2013 1152   CL 99 10/24/2013 1152   CO2 27 10/24/2013 1152   BUN 17 10/24/2013 1152   CREATININE 0.98 10/24/2013 1152   CREATININE 1.23 01/26/2013 1910      Component Value Date/Time   CALCIUM 9.3 10/24/2013 1152   ALKPHOS 43 10/24/2013 1152   AST 17 10/24/2013 1152   ALT 13 10/24/2013 1152   BILITOT 0.6 10/24/2013 1152      Hemoglobin A1c 11.2    Assessment:  Type 2 diabetes mellitus without complication - Plan: POCT glycosylated hemoglobin (Hb A1C)  Need for prophylactic vaccination against Streptococcus pneumoniae (pneumococcus) - Plan: Pneumococcal conjugate vaccine 13-valent  Obesity (BMI 30-39.9)  Hypertension associated with diabetes  Mixed hyperlipidemia due to type 2 diabetes mellitus  Personal history of noncompliance with medical treatment, presenting hazards to health        Plan:    1.  Rx changes: none 2.  Education: Reviewed 'ABCs' of diabetes management (respective goals in parentheses):  A1C (<7), blood pressure (<130/80), and cholesterol (LDL <100). 3.  Compliance at present is estimated to be poor. Efforts to improve compliance (if necessary) will be directed at increased exercise and regular blood sugar monitoring: 2 times daily. 4. Follow up: 4 months  I discussed his lack of letting me know about his glucometer and how dangerous that was. Explained that he is still not under good control. He is to keep track of his blood sugar in the morning and check it either before a meal or 2 hours after a meal regularly. He is to call me in one week with these results. We will readjust his insulin dosing based on this. Discussed the risk to him in regard to blindness, renal disease and nerve damage. Again encouraged him to make major lifestyle changes in regard to this.

## 2014-08-10 ENCOUNTER — Other Ambulatory Visit: Payer: Self-pay | Admitting: Otolaryngology

## 2014-08-19 ENCOUNTER — Other Ambulatory Visit: Payer: Self-pay | Admitting: Cardiovascular Disease

## 2014-08-20 ENCOUNTER — Other Ambulatory Visit: Payer: Self-pay | Admitting: Family Medicine

## 2014-09-17 ENCOUNTER — Other Ambulatory Visit: Payer: Self-pay | Admitting: Family Medicine

## 2014-09-26 ENCOUNTER — Other Ambulatory Visit: Payer: Self-pay | Admitting: Family Medicine

## 2014-10-11 NOTE — Progress Notes (Signed)
No show

## 2014-10-12 ENCOUNTER — Encounter: Payer: 59 | Admitting: Cardiovascular Disease

## 2014-10-20 ENCOUNTER — Other Ambulatory Visit: Payer: Self-pay | Admitting: Cardiovascular Disease

## 2014-10-22 ENCOUNTER — Other Ambulatory Visit: Payer: Self-pay | Admitting: Cardiovascular Disease

## 2014-10-23 NOTE — Telephone Encounter (Signed)
Please advise on refill. Patient no showed recent appointment and did not reschedule. Thanks, MI

## 2014-10-24 NOTE — Telephone Encounter (Signed)
Spoke with pt and explained to him he is past due for follow up and needs office visit for further refills of medications.  Pt reports he missed scheduled appt on October 12, 2014 because he had to work.  I scheduled pt to see Ermalinda Barrios, PA on October 30, 2014 at 8:45.  Pt aware he will need to keep this appt for refills.  Will send refill of Clopidogrel to [facility] on [address] to last until this appt.

## 2014-10-30 ENCOUNTER — Ambulatory Visit: Payer: Self-pay | Admitting: Physician Assistant

## 2014-11-13 ENCOUNTER — Encounter: Payer: Self-pay | Admitting: Physician Assistant

## 2014-11-13 ENCOUNTER — Ambulatory Visit (INDEPENDENT_AMBULATORY_CARE_PROVIDER_SITE_OTHER): Payer: 59 | Admitting: Physician Assistant

## 2014-11-13 VITALS — BP 138/96 | HR 78 | Ht 70.0 in | Wt 246.8 lb

## 2014-11-13 DIAGNOSIS — E1169 Type 2 diabetes mellitus with other specified complication: Secondary | ICD-10-CM

## 2014-11-13 DIAGNOSIS — E1159 Type 2 diabetes mellitus with other circulatory complications: Secondary | ICD-10-CM

## 2014-11-13 DIAGNOSIS — I152 Hypertension secondary to endocrine disorders: Secondary | ICD-10-CM

## 2014-11-13 DIAGNOSIS — E782 Mixed hyperlipidemia: Secondary | ICD-10-CM

## 2014-11-13 DIAGNOSIS — IMO0002 Reserved for concepts with insufficient information to code with codable children: Secondary | ICD-10-CM

## 2014-11-13 DIAGNOSIS — E669 Obesity, unspecified: Secondary | ICD-10-CM

## 2014-11-13 DIAGNOSIS — E1165 Type 2 diabetes mellitus with hyperglycemia: Secondary | ICD-10-CM

## 2014-11-13 DIAGNOSIS — I2510 Atherosclerotic heart disease of native coronary artery without angina pectoris: Secondary | ICD-10-CM

## 2014-11-13 DIAGNOSIS — I10 Essential (primary) hypertension: Secondary | ICD-10-CM

## 2014-11-13 MED ORDER — CLOPIDOGREL BISULFATE 75 MG PO TABS: 75.0000 mg | ORAL_TABLET | Freq: Every day | ORAL | Status: DC

## 2014-11-13 NOTE — Progress Notes (Signed)
Cardiology Office Note   Date:  11/13/2014   ID:  Philip Black 1955-10-16, MRN [id number]  PCP:  Philip Haste, MD  Cardiologist:  Dr. Angelena Black  No chief complaint on file.     History of Present Illness: Philip Black is a 59 y.o. male  patient of Dr. Angelena Black with history of CAD, HTN, HLD, DM, OSA, and obesity here today for cardiac follow up.He has not been seen in this office since 07/2013 with musculoskeletal chest pain. In May 2009 he had an anterior MI treated with a drug-eluting stent to the LAD. He had residual 80% narrowing in the posterolateral branch and 90% narrowing of the marginal branch the circumflex artery which was managed medically. Is not on beta blocker secondary to bradycardia and fatigue in the past. He is been maintained on aspirin/statin/Plavix/ACE inhibitor.  Patient overall has done well since last seen. He exercises doing cardio at the gym regularly without symptoms. He works as a Technical sales engineer and during the cold weather noticed a mild chest tightness when pushing a bin up hill. It eased immediately when he got back in the truck. He was filling in for someone and usually doesn't have this job. This only occurred once during the extreme cold. Blood pressure and lipids have been controlled according to patient. BP is up today, but he ate a lot of salty foods at the super bowl party last night. He says his diabetes has been hard to control.     Past Medical History  Diagnosis Date  . Diabetes mellitus type 2  . CAD S/P percutaneous coronary angioplasty   . OSA (obstructive sleep apnea)   . Hypertension   . Metabolic syndrome   . Arthritis     Past Surgical History  Procedure Laterality Date  . Tonsillectomy       Current Outpatient Prescriptions  Medication Sig Dispense Refill  . amLODipine (NORVASC) 5 MG tablet Take 1 tablet (5 mg total) by mouth daily. 90 tablet 3  . aspirin 81 MG tablet Take 81 mg by mouth daily.      .  clopidogrel (PLAVIX) 75 MG tablet TAKE 1 TABLET BY MOUTH EVERY DAY 7 tablet 0  . clopidogrel (PLAVIX) 75 MG tablet TAKE 1 TABLET BY MOUTH EVERY DAY 30 tablet 0  . colesevelam (WELCHOL) 625 MG tablet Take 1,875 mg by mouth 2 (two) times daily with a meal.    . CRESTOR 40 MG tablet TAKE 1/2 TABLET DAILY 15 tablet 3  . ezetimibe (ZETIA) 10 MG tablet Take 10 mg by mouth daily.    . hydrochlorothiazide (HYDRODIURIL) 25 MG tablet Take 25 mg by mouth daily.    . hydrochlorothiazide (HYDRODIURIL) 25 MG tablet TAKE 1 TABLET BY MOUTH ONCE DAILY 30 tablet 2  . insulin detemir (LEVEMIR) 100 unit/ml SOLN Inject 0.1 mLs (10 Units total) into the skin at bedtime. (Patient taking differently: Inject 50 Units into the skin at bedtime. ) 1 vial 5  . Insulin Pen Needle 32G X 4 MM MISC 1 each by Does not apply route daily. 100 each 6  . KAZANO 12.02-999 MG TABS TAKE 1 TABLET BY MOUTH TWICE A DAY 60 tablet 5  . LEVITRA 20 MG tablet TAKE 1 TABLET (20 MG TOTAL) BY MOUTH DAILY AS NEEDED FOR ERECTILE DYSFUNCTION. 6 tablet 6  . pioglitazone (ACTOS) 45 MG tablet TAKE 1 TABLET BY MOUTH EVERY DAY 30 tablet 2  . quinapril (ACCUPRIL) 40 MG tablet TAKE 1 TABLET EVERY  DAY FOR BLOOD PRESSURE 30 tablet 2  . sildenafil (VIAGRA) 100 MG tablet Take 1 tablet (100 mg total) by mouth daily as needed for erectile dysfunction. 10 tablet 5  . ZETIA 10 MG tablet TAKE 1 TABLET BY MOUTH EVERY DAY FOR CHOLSTEROL 30 tablet 5   No current facility-administered medications for this visit.    Allergies:   Review of patient's allergies indicates no known allergies.    Social History:  The patient  reports that he has never smoked. He has never used smokeless tobacco. He reports that he does not drink alcohol or use illicit drugs.   Family History:  The patient's family history includes Diabetes in his mother; Heart attack in his father.    ROS:  Please see the history of present illness  All other systems are reviewed and negative.     PHYSICAL EXAM: BP 138/96 mmHg  Pulse 78  Ht 5\' 10"  (1.778 m)  Wt 246 lb 12.8 oz (111.948 kg)  BMI 35.41 kg/m2 GEN: Obese, well developed, in no acute distress HEENT: normal Neck: no JVD, carotid bruits, or masses Cardiac: RRR; positive S4, 2/6 systolic murmur LSB, no rubs, or edema  Respiratory:  clear to auscultation bilaterally, normal work of breathing GI: soft, nontender, nondistended, + BS MS: no deformity or atrophy Skin: warm and dry, no rash Neuro:  Strength and sensation are intact Psych: euthymic mood, full affect   EKG:  EKG  ordered today. The ekg ordered today demonstrates NSR, no acute change.  Recent Labs: No results found for requested labs within last 365 days.    Lipid Panel    Component Value Date/Time   CHOL 137 10/24/2013 1152   TRIG 60 10/24/2013 1152   HDL 62 10/24/2013 1152   CHOLHDL 2.2 10/24/2013 1152   VLDL 12 10/24/2013 1152   LDLCALC 63 10/24/2013 1152   LDLDIRECT 64 08/08/2009      Wt Readings from Last 3 Encounters:  08/09/14 241 lb (109.317 kg)  04/19/14 246 lb (111.585 kg)  10/24/13 253 lb (114.76 kg)      Other studies Reviewed: Additional studies/ records that were reviewed today include: CONCLUSION:  Coronary artery disease with possible ST-elevation  myocardial infarction with 90% stenosis in the proximal LAD, 90%  stenosis in the second marginal branch of circumflex artery, 50%  narrowing in the mid circumflex artery, and 80% narrowing in the  posterior branch off the circumflex artery, no significant obstruction.  A small dominant right coronary and normal LV function.  1. Successful PCI of the lesion in the proximal LAD using a PROMUS      drug-eluting stent with improvement of the central narrowing from      90% to 0% with TIMI III flow before and after intervention.    RECOMMENDATIONS:  The patient returned to post angio room for further  observation.  It is not clear whether this was STEMI or not.  There was   no clear wall motion abnormality.  His enzymes returned positive.  I  would classify this as STEMI.  If not, I would classify it as unstable  angina.  We will plan and continued secondary risk factor modification.      Philip Alfonso Patten Philip Perches, MD, [facility]  Electronically Signed        BRB/MEDQ  D:  02/14/2008  T:  02/15/2008  Job:  [id number]         Signed, Ermalinda Barrios, PA-C  11/13/2014 12:45 PM    [facility] [address], [place], [place]  [address] Phone: [phone]; Fax: [phone]

## 2014-11-13 NOTE — Assessment & Plan Note (Signed)
Patient continues to try to lose weight.

## 2014-11-13 NOTE — Assessment & Plan Note (Signed)
BP up today but usually controlled. Excessive salt last night. Recommend 2 gm sodium diet.

## 2014-11-13 NOTE — Assessment & Plan Note (Signed)
Managed by Dr. Redmond School

## 2014-11-13 NOTE — Assessment & Plan Note (Signed)
Overall the patient has done well except for 1 episode of chest pain while working in the extreme cold.  He exercises regularly without symptoms. EKG without change. I ask him to call us if this reoccurs.

## 2014-11-13 NOTE — Patient Instructions (Signed)
Your physician recommends that you continue on your current medications as directed. Please refer to the Current Medication list given to you today.  Your physician wants you to follow-up in: 6 month ov with Dr Shirley Friar will receive a reminder letter in the mail two months in advance. If you don't receive a letter, please call our office to schedule the follow-up appointment.   Call back if chest pains  Low-Sodium Eating Plan Sodium raises blood pressure and causes water to be held in the body. Getting less sodium from food will help lower your blood pressure, reduce any swelling, and protect your heart, liver, and kidneys. We get sodium by adding salt (sodium chloride) to food. Most of our sodium comes from canned, boxed, and frozen foods. Restaurant foods, fast foods, and pizza are also very high in sodium. Even if you take medicine to lower your blood pressure or to reduce fluid in your body, getting less sodium from your food is important. WHAT IS MY PLAN? Most people should limit their sodium intake to 2,300 mg a day. Your health care provider recommends that you limit your sodium intake to ___2 grams_______ a day.  WHAT DO I NEED TO KNOW ABOUT THIS EATING PLAN? For the low-sodium eating plan, you will follow these general guidelines:  Choose foods with a % Daily Value for sodium of less than 5% (as listed on the food label).   Use salt-free seasonings or herbs instead of table salt or sea salt.   Check with your health care provider or pharmacist before using salt substitutes.   Eat fresh foods.  Eat more vegetables and fruits.  Limit canned vegetables. If you do use them, rinse them well to decrease the sodium.   Limit cheese to 1 oz (28 g) per day.   Eat lower-sodium products, often labeled as "lower sodium" or "no salt added."  Avoid foods that contain monosodium glutamate (MSG). MSG is sometimes added to [place] food and some canned foods.  Check food labels  (Nutrition Facts labels) on foods to learn how much sodium is in one serving.  Eat more home-cooked food and less restaurant, buffet, and fast food.  When eating at a restaurant, ask that your food be prepared with less salt or none, if possible.  HOW DO I READ FOOD LABELS FOR SODIUM INFORMATION? The Nutrition Facts label lists the amount of sodium in one serving of the food. If you eat more than one serving, you must multiply the listed amount of sodium by the number of servings. Food labels may also identify foods as:  Sodium free--Less than 5 mg in a serving.  Very low sodium--35 mg or less in a serving.  Low sodium--140 mg or less in a serving.  Light in sodium--50% less sodium in a serving. For example, if a food that usually has 300 mg of sodium is changed to become light in sodium, it will have 150 mg of sodium.  Reduced sodium--25% less sodium in a serving. For example, if a food that usually has 400 mg of sodium is changed to reduced sodium, it will have 300 mg of sodium. WHAT FOODS CAN I EAT? Grains Low-sodium cereals, including oats, puffed wheat and rice, and shredded wheat cereals. Low-sodium crackers. Unsalted rice and pasta. Lower-sodium bread.  Vegetables Frozen or fresh vegetables. Low-sodium or reduced-sodium canned vegetables. Low-sodium or reduced-sodium tomato sauce and paste. Low-sodium or reduced-sodium tomato and vegetable juices.  Fruits Fresh, frozen, and canned fruit. Fruit juice.  Meat  and Other Protein Products Low-sodium canned tuna and salmon. Fresh or frozen meat, poultry, seafood, and fish. Lamb. Unsalted nuts. Dried beans, peas, and lentils without added salt. Unsalted canned beans. Homemade soups without salt. Eggs.  Dairy Milk. Soy milk. Ricotta cheese. Low-sodium or reduced-sodium cheeses. Yogurt.  Condiments Fresh and dried herbs and spices. Salt-free seasonings. Onion and garlic powders. Low-sodium varieties of mustard and ketchup. Lemon  juice.  Fats and Oils Reduced-sodium salad dressings. Unsalted butter.  Other Unsalted popcorn and pretzels.  The items listed above may not be a complete list of recommended foods or beverages. Contact your dietitian for more options. WHAT FOODS ARE NOT RECOMMENDED? Grains Instant hot cereals. Bread stuffing, pancake, and biscuit mixes. Croutons. Seasoned rice or pasta mixes. Noodle soup cups. Boxed or frozen macaroni and cheese. Self-rising flour. Regular salted crackers. Vegetables Regular canned vegetables. Regular canned tomato sauce and paste. Regular tomato and vegetable juices. Frozen vegetables in sauces. Salted french fries. Olives. Angie Fava. Relishes. Sauerkraut. Salsa. Meat and Other Protein Products Salted, canned, smoked, spiced, or pickled meats, seafood, or fish. Bacon, ham, sausage, hot dogs, corned beef, chipped beef, and packaged luncheon meats. Salt pork. Jerky. Pickled herring. Anchovies, regular canned tuna, and sardines. Salted nuts. Dairy Processed cheese and cheese spreads. Cheese curds. Blue cheese and cottage cheese. Buttermilk.  Condiments Onion and garlic salt, seasoned salt, table salt, and sea salt. Canned and packaged gravies. Worcestershire sauce. Tartar sauce. Barbecue sauce. Teriyaki sauce. Soy sauce, including reduced sodium. Steak sauce. Fish sauce. Oyster sauce. Cocktail sauce. Horseradish. Regular ketchup and mustard. Meat flavorings and tenderizers. Bouillon cubes. Hot sauce. Tabasco sauce. Marinades. Taco seasonings. Relishes. Fats and Oils Regular salad dressings. Salted butter. Margarine. Ghee. Bacon fat.  Other Potato and tortilla chips. Corn chips and puffs. Salted popcorn and pretzels. Canned or dried soups. Pizza. Frozen entrees and pot pies.  The items listed above may not be a complete list of foods and beverages to avoid. Contact your dietitian for more information. Document Released: 03/14/2002 Document Revised: 09/27/2013 Document  Reviewed: 07/27/2013 [facility] Patient Information 2015 [place], [place]. This information is not intended to replace advice given to you by your health care provider. Make sure you discuss any questions you have with your health care provider.

## 2014-11-13 NOTE — Assessment & Plan Note (Signed)
Managed by Dr. Redmond School.

## 2014-11-16 ENCOUNTER — Other Ambulatory Visit: Payer: Self-pay

## 2014-11-16 ENCOUNTER — Telehealth: Payer: Self-pay | Admitting: Internal Medicine

## 2014-11-16 MED ORDER — ALOGLIPTIN-METFORMIN HCL 12.5-1000 MG PO TABS: 1.0000 | ORAL_TABLET | Freq: Two times a day (BID) | ORAL | Status: DC

## 2014-11-16 NOTE — Telephone Encounter (Signed)
Pt needs a refill on his kazono to [facility]

## 2014-11-17 ENCOUNTER — Telehealth: Payer: Self-pay | Admitting: Family Medicine

## 2014-11-17 ENCOUNTER — Other Ambulatory Visit: Payer: Self-pay | Admitting: Family Medicine

## 2014-11-17 MED ORDER — COLESEVELAM HCL 625 MG PO TABS: 1875.0000 mg | ORAL_TABLET | Freq: Two times a day (BID) | ORAL | Status: DC

## 2014-11-17 NOTE — Telephone Encounter (Signed)
RX REFILL SENT FOR [facility]

## 2014-11-17 NOTE — Telephone Encounter (Signed)
Requesting refill on Welchol 625mg 

## 2014-12-18 ENCOUNTER — Ambulatory Visit: Payer: 59 | Admitting: Family Medicine

## 2014-12-22 ENCOUNTER — Emergency Department (HOSPITAL_COMMUNITY)
Admission: EM | Admit: 2014-12-22 | Discharge: 2014-12-23 | Disposition: A | Discharge status: Home / Self Care | Payer: 59 | Attending: Emergency Medicine | Admitting: Emergency Medicine

## 2014-12-22 ENCOUNTER — Encounter (HOSPITAL_COMMUNITY): Payer: Self-pay | Admitting: *Deleted

## 2014-12-22 DIAGNOSIS — Z8669 Personal history of other diseases of the nervous system and sense organs: Secondary | ICD-10-CM | POA: Diagnosis not present

## 2014-12-22 DIAGNOSIS — Y998 Other external cause status: Secondary | ICD-10-CM | POA: Insufficient documentation

## 2014-12-22 DIAGNOSIS — Z79899 Other long term (current) drug therapy: Secondary | ICD-10-CM | POA: Diagnosis not present

## 2014-12-22 DIAGNOSIS — E119 Type 2 diabetes mellitus without complications: Secondary | ICD-10-CM | POA: Diagnosis not present

## 2014-12-22 DIAGNOSIS — M1990 Unspecified osteoarthritis, unspecified site: Secondary | ICD-10-CM | POA: Insufficient documentation

## 2014-12-22 DIAGNOSIS — Y929 Unspecified place or not applicable: Secondary | ICD-10-CM | POA: Diagnosis not present

## 2014-12-22 DIAGNOSIS — I10 Essential (primary) hypertension: Secondary | ICD-10-CM | POA: Diagnosis not present

## 2014-12-22 DIAGNOSIS — Z7982 Long term (current) use of aspirin: Secondary | ICD-10-CM | POA: Diagnosis not present

## 2014-12-22 DIAGNOSIS — Y9389 Activity, other specified: Secondary | ICD-10-CM | POA: Insufficient documentation

## 2014-12-22 DIAGNOSIS — Z9861 Coronary angioplasty status: Secondary | ICD-10-CM | POA: Insufficient documentation

## 2014-12-22 DIAGNOSIS — I2510 Atherosclerotic heart disease of native coronary artery without angina pectoris: Secondary | ICD-10-CM | POA: Diagnosis not present

## 2014-12-22 DIAGNOSIS — S61011A Laceration without foreign body of right thumb without damage to nail, initial encounter: Secondary | ICD-10-CM | POA: Diagnosis not present

## 2014-12-22 DIAGNOSIS — Z794 Long term (current) use of insulin: Secondary | ICD-10-CM | POA: Diagnosis not present

## 2014-12-22 DIAGNOSIS — S61209A Unspecified open wound of unspecified finger without damage to nail, initial encounter: Secondary | ICD-10-CM

## 2014-12-22 DIAGNOSIS — Z7902 Long term (current) use of antithrombotics/antiplatelets: Secondary | ICD-10-CM | POA: Insufficient documentation

## 2014-12-22 DIAGNOSIS — Y289XXA Contact with unspecified sharp object, undetermined intent, initial encounter: Secondary | ICD-10-CM | POA: Diagnosis not present

## 2014-12-22 DIAGNOSIS — Z23 Encounter for immunization: Secondary | ICD-10-CM | POA: Diagnosis not present

## 2014-12-22 MED ORDER — TETANUS-DIPHTH-ACELL PERTUSSIS 5-2.5-18.5 LF-MCG/0.5 IM SUSP
0.5000 mL | Freq: Once | INTRAMUSCULAR | Status: AC
  Administered 2014-12-22: 0.5 mL via INTRAMUSCULAR
  Filled 2014-12-22: qty 0.5

## 2014-12-22 NOTE — ED Provider Notes (Signed)
CSN: [id number]     Arrival date & time 12/22/14  2241 History  This chart was scribed for non-physician practitioner Lorre Munroe, PA-C working with Fredia Sorrow, MD by Hilda Lias, ED Scribe. This patient was seen in room TR06C/TR06C and the patient's care was started at 11:19 PM.    Chief Complaint  Patient presents with  . Laceration     No language interpreter was used.     HPI Comments: Philip Black is a 59 y.o. male who presents to the Emergency Department complaining of right thumb avulsion injury. Patient states he was cutting onions, when he slipped and take off a small piece of his thumb. He has tried treating himself with gauze. He has not taken anything to alleviate his symptoms. His last tetanus shot is unknown. He denies any other injuries. States the pain is mild. It is worsened with palpation.   Past Medical History  Diagnosis Date  . Diabetes mellitus type 2  . CAD S/P percutaneous coronary angioplasty   . OSA (obstructive sleep apnea)   . Hypertension   . Metabolic syndrome   . Arthritis    Past Surgical History  Procedure Laterality Date  . Tonsillectomy     Family History  Problem Relation Age of Onset  . Diabetes Mother   . Heart attack Father    History  Substance Use Topics  . Smoking status: Never Smoker   . Smokeless tobacco: Never Used  . Alcohol Use: No    Review of Systems  Constitutional: Negative for fever and chills.  Respiratory: Negative for shortness of breath.   Cardiovascular: Negative for chest pain.  Gastrointestinal: Negative for nausea, vomiting, diarrhea and constipation.  Genitourinary: Negative for dysuria.  All other systems reviewed and are negative.     Allergies  Review of patient's allergies indicates no known allergies.  Home Medications   Prior to Admission medications   Medication Sig Start Date End Date Taking? Authorizing Provider  Alogliptin-Metformin HCl (KAZANO) 12.02-999 MG TABS Take 1 tablet by  mouth 2 (two) times daily. 11/16/14   Denita Lung, MD  amLODipine (NORVASC) 5 MG tablet Take 1 tablet (5 mg total) by mouth daily. 04/19/14   Denita Lung, MD  aspirin 81 MG tablet Take 81 mg by mouth daily.      Historical Provider, MD  clopidogrel (PLAVIX) 75 MG tablet Take 1 tablet (75 mg total) by mouth daily. 11/13/14   Imogene Burn, PA-C  colesevelam [facility]) 625 MG tablet Take 3 tablets (1,875 mg total) by mouth 2 (two) times daily with a meal. 11/17/14   Denita Lung, MD  CRESTOR 40 MG tablet TAKE 1/2 TABLET DAILY 09/18/14   Denita Lung, MD  ezetimibe (ZETIA) 10 MG tablet Take 10 mg by mouth daily.    Historical Provider, MD  hydrochlorothiazide (HYDRODIURIL) 25 MG tablet TAKE 1 TABLET BY MOUTH ONCE DAILY 09/26/14   Denita Lung, MD  insulin detemir (LEVEMIR) 100 unit/ml SOLN Inject 0.1 mLs (10 Units total) into the skin at bedtime. Patient taking differently: Inject 50 Units into the skin at bedtime.  05/11/14   Denita Lung, MD  Insulin Pen Needle 32G X 4 MM MISC 1 each by Does not apply route daily. 04/19/14   Denita Lung, MD  pioglitazone (ACTOS) 45 MG tablet TAKE 1 TABLET BY MOUTH EVERY DAY 03/09/14   Denita Lung, MD  quinapril (ACCUPRIL) 40 MG tablet TAKE 1 TABLET EVERY DAY FOR  BLOOD PRESSURE 09/26/14   Denita Lung, MD  sildenafil (VIAGRA) 100 MG tablet Take 1 tablet (100 mg total) by mouth daily as needed for erectile dysfunction. 04/19/14   Denita Lung, MD   BP 148/97 mmHg  Pulse 78  Temp(Src) 98 F (36.7 C) (Oral)  Resp 20  Ht 5\' 10"  (1.778 m)  Wt 238 lb (107.956 kg)  BMI 34.15 kg/m2  SpO2 96% Physical Exam  Constitutional: He is oriented to person, place, and time. He appears well-developed and well-nourished.  HENT:  Head: Normocephalic and atraumatic.  Eyes: Conjunctivae and EOM are normal.  Neck: Normal range of motion.  Cardiovascular: Normal rate.   Pulmonary/Chest: Effort normal.  Abdominal: He exhibits no distension.  Musculoskeletal:  Normal range of motion.  Neurological: He is alert and oriented to person, place, and time.  Skin: Skin is warm and dry.  Mild avulsion injury to patient's right thumb, no gaping laceration, no bony involvement, no tendon involvement, no foreign body  Psychiatric: He has a normal mood and affect. His behavior is normal. Judgment and thought content normal.  Nursing note and vitals reviewed.   ED Course  Procedures (including critical care time)  DIAGNOSTIC STUDIES: Oxygen Saturation is 96% on RA, normal by my interpretation.    COORDINATION OF CARE: 11:19 PM Discussed treatment plan with pt at bedside and pt agreed to plan.   Labs Review Labs Reviewed - No data to display  Imaging Review No results found.   EKG Interpretation None      MDM   Final diagnoses:  Finger avulsion, initial encounter    Patient with finger tip avulsion injury, no bony involvement, no indication for repair. Tetanus shot updated. Wound cleansed and dressed. Recommend primary care follow-up. Patient understands and agrees with the plan.  I personally performed the services described in this documentation, which was scribed in my presence. The recorded information has been reviewed and is accurate.     Montine Circle, PA-C 12/23/14 0013  Fredia Sorrow, MD 12/23/14 [phone]

## 2014-12-22 NOTE — ED Notes (Signed)
Pt in c/o laceration to his right thumb, unknown last tetanus, bleeding controlled in triage

## 2014-12-23 NOTE — ED Notes (Signed)
Gauze dressing with cover placed on affected thumb after soaking in betadine/saline solution.

## 2014-12-23 NOTE — Discharge Instructions (Signed)
Fingertip Injuries and Amputations °Fingertip injuries are common and often get injured because they are last to escape when pulling your hand out of harm's way. You have amputated (cut off) part of your finger. How this turns out depends largely on how much was amputated. If just the tip is amputated, often the end of the finger will grow back and the finger may return to much the same as it was before the injury.  °If more of the finger is missing, your caregiver has done the best with the tissue remaining to allow you to keep as much finger as is possible. Your caregiver after checking your injury has tried to leave you with a painless fingertip that has durable, feeling skin. If possible, your caregiver has tried to maintain the finger's length and appearance and preserve its fingernail.  °Please read the instructions outlined below and refer to this sheet in the next few weeks. These instructions provide you with general information on caring for yourself. Your caregiver may also give you specific instructions. While your treatment has been done according to the most current medical practices available, unavoidable complications occasionally occur. If you have any problems or questions after discharge, please call your caregiver. °HOME CARE INSTRUCTIONS  °· You may resume normal diet and activities as directed or allowed. °· Keep your hand elevated above the level of your heart. This helps decrease pain and swelling. °· Keep ice packs (or a bag of ice wrapped in a towel) on the injured area for 15-20 minutes, 03-04 times per day, for the first two days. °· Change dressings if necessary or as directed. °· Clean the wound daily or as directed. °· Only take over-the-counter or prescription medicines for pain, discomfort, or fever as directed by your caregiver. °· Keep appointments as directed. °SEEK IMMEDIATE MEDICAL CARE IF: °· You develop redness, swelling, numbness or increasing pain in the wound. °· There is  pus coming from the wound. °· You develop an unexplained oral temperature above 102° F (38.9° C) or as your caregiver suggests. °· There is a foul (bad) smell coming from the wound or dressing. °· There is a breaking open of the wound (edges not staying together) after sutures or staples have been removed. °MAKE SURE YOU:  °· Understand these instructions. °· Will watch your condition. °· Will get help right away if you are not doing well or get worse. °Document Released: 08/13/2005 Document Revised: 12/15/2011 Document Reviewed: 07/12/2008 °Exit[employer]® Patient Information ©2015 [employer]. This information is not intended to replace advice given to you by your health care provider. Make sure you discuss any questions you have with your health care provider. ° °

## 2015-01-05 ENCOUNTER — Other Ambulatory Visit: Payer: Self-pay | Admitting: Family Medicine

## 2015-01-10 ENCOUNTER — Telehealth: Payer: Self-pay

## 2015-01-10 ENCOUNTER — Ambulatory Visit: Payer: Self-pay | Admitting: Family Medicine

## 2015-01-10 NOTE — Telephone Encounter (Signed)
I will start the dismissal process and inform Beverlee Nims.

## 2015-01-10 NOTE — Telephone Encounter (Signed)
Patient no showed today, 12/18/14,and 05/17/14 what would you like for me to do

## 2015-01-10 NOTE — Telephone Encounter (Signed)
We need to go ahead and discharge him a not send the $50 no-show fee

## 2015-01-15 ENCOUNTER — Encounter: Payer: Self-pay | Admitting: Family Medicine

## 2015-02-07 ENCOUNTER — Other Ambulatory Visit: Payer: Self-pay | Admitting: Family Medicine

## 2015-03-10 ENCOUNTER — Other Ambulatory Visit: Payer: Self-pay | Admitting: Family Medicine

## 2015-04-07 ENCOUNTER — Other Ambulatory Visit: Payer: Self-pay | Admitting: Family Medicine

## 2015-04-20 ENCOUNTER — Other Ambulatory Visit: Payer: Self-pay | Admitting: Family Medicine

## 2015-04-22 ENCOUNTER — Other Ambulatory Visit: Payer: Self-pay | Admitting: Family Medicine

## 2015-04-29 ENCOUNTER — Other Ambulatory Visit: Payer: Self-pay | Admitting: Family Medicine

## 2015-05-06 ENCOUNTER — Other Ambulatory Visit: Payer: Self-pay | Admitting: Family Medicine

## 2015-05-13 ENCOUNTER — Other Ambulatory Visit: Payer: Self-pay | Admitting: Family Medicine

## 2015-05-17 ENCOUNTER — Other Ambulatory Visit: Payer: Self-pay | Admitting: Family Medicine

## 2015-06-11 ENCOUNTER — Other Ambulatory Visit: Payer: Self-pay | Admitting: Family Medicine

## 2015-06-17 ENCOUNTER — Other Ambulatory Visit: Payer: Self-pay | Admitting: Physician Assistant

## 2015-06-27 ENCOUNTER — Encounter: Payer: Self-pay | Admitting: *Deleted

## 2015-06-29 ENCOUNTER — Other Ambulatory Visit: Payer: Self-pay | Admitting: Family Medicine

## 2015-07-01 ENCOUNTER — Other Ambulatory Visit: Payer: Self-pay | Admitting: Family Medicine

## 2015-07-02 ENCOUNTER — Encounter: Payer: Self-pay | Admitting: Cardiovascular Disease

## 2015-07-02 ENCOUNTER — Ambulatory Visit (INDEPENDENT_AMBULATORY_CARE_PROVIDER_SITE_OTHER): Payer: Commercial Managed Care - HMO | Admitting: Cardiovascular Disease

## 2015-07-02 VITALS — BP 146/90 | HR 76 | Ht 69.0 in | Wt 243.0 lb

## 2015-07-02 DIAGNOSIS — E782 Mixed hyperlipidemia: Secondary | ICD-10-CM | POA: Diagnosis not present

## 2015-07-02 DIAGNOSIS — I2510 Atherosclerotic heart disease of native coronary artery without angina pectoris: Secondary | ICD-10-CM

## 2015-07-02 DIAGNOSIS — I10 Essential (primary) hypertension: Secondary | ICD-10-CM

## 2015-07-02 NOTE — Progress Notes (Signed)
Chief Complaint  Patient presents with  . Follow-up     History of Present Illness: 59 yo male with history of CAD, HTN, HLD, DM, OSA here today for cardiac follow up. He has been followed in the past by Dr. Olevia Perches. In May 2009 he had an anterior MI treated with a drug-eluting stent to the LAD. He had residual 80% narrowing in the posterolateral branch and 90% narrowing of the marginal branch the circumflex artery which was managed medically. He has not tolerated beta blockers due to bradycardia.   He is here today for follow up. He has had no chest pain or SOB. He has been exercising 3 days per week. He is taking all meds. His BP is well controlled at home.   Primary Care Physician: [facility]: Buddy Duty   Last Lipid Profile: Followed in Endocrine   Past Medical History  Diagnosis Date  . Diabetes mellitus type 2  . CAD S/P percutaneous coronary angioplasty   . OSA (obstructive sleep apnea)   . Hypertension   . Metabolic syndrome   . Arthritis     Past Surgical History  Procedure Laterality Date  . Tonsillectomy      Current Outpatient Prescriptions  Medication Sig Dispense Refill  . amLODipine (NORVASC) 5 MG tablet Take 1 tablet (5 mg total) by mouth daily. 90 tablet 3  . aspirin 81 MG tablet Take 81 mg by mouth daily.      . clopidogrel (PLAVIX) 75 MG tablet TAKE 1 TABLET (75 MG TOTAL) BY MOUTH DAILY. 30 tablet 0  . colesevelam (WELCHOL) 625 MG tablet Take 3 tablets (1,875 mg total) by mouth 2 (two) times daily with a meal. 60 tablet 5  . CRESTOR 40 MG tablet TAKE 1/2 TABLET DAILY 15 tablet 2  . ezetimibe (ZETIA) 10 MG tablet Take 10 mg by mouth daily.    . Insulin Pen Needle 32G X 4 MM MISC 1 each by Does not apply route daily. 100 each 6  . INVOKAMET 50-1000 MG TABS Take 2 tablets by mouth daily.  11  . LEVEMIR FLEXTOUCH 100 UNIT/ML Pen INJECT 0.1 MLS (10 UNITS TOTAL) INTO THE SKIN AT BEDTIME. 3 mL 3  . quinapril (ACCUPRIL) 40 MG tablet TAKE 1 TABLET EVERY DAY FOR  BLOOD PRESSURE 30 tablet 0  . sildenafil (VIAGRA) 100 MG tablet Take 1 tablet (100 mg total) by mouth daily as needed for erectile dysfunction. 10 tablet 5   No current facility-administered medications for this visit.    No Known Allergies  Social History   Social History  . Marital Status: Married    Spouse Name: N/A  . Number of Children: 3  . Years of Education: N/A   Occupational History  . Trash collection Unemployed   Social History Main Topics  . Smoking status: Never Smoker   . Smokeless tobacco: Never Used  . Alcohol Use: No  . Drug Use: No  . Sexual Activity: Yes   Other Topics Concern  . Not on file   Social History Narrative    Family History  Problem Relation Age of Onset  . Diabetes Mother   . Heart attack Father     Review of Systems:  As stated in the HPI and otherwise negative.   BP 146/90 mmHg  Pulse 76  Ht 5\' 9"  (1.753 m)  Wt 243 lb (110.224 kg)  BMI 35.87 kg/m2  SpO2 97%  Physical Examination: General: Well developed, well nourished, NAD HEENT: OP clear, mucus  membranes moist SKIN: warm, dry. No rashes. Neuro: No focal deficits Musculoskeletal: Muscle strength 5/5 all ext Psychiatric: Mood and affect normal Neck: No JVD, no carotid bruits, no thyromegaly, no lymphadenopathy. Lungs:Clear bilaterally, no wheezes, rhonci, crackles Cardiovascular: Regular rate and rhythm. No murmurs, gallops or rubs. Abdomen:Soft. Bowel sounds present. Non-tender.  Extremities: No lower extremity edema. Pulses are 2 + in the bilateral DP/PT.  EKG:  EKG is not ordered today. The ekg ordered today demonstrates   Recent Labs: No results found for requested labs within last 365 days.   Lipid Panel   Wt Readings from Last 3 Encounters:  07/02/15 243 lb (110.224 kg)  12/22/14 238 lb (107.956 kg)  11/13/14 246 lb 12.8 oz (111.948 kg)     Other studies Reviewed: Additional studies/ records that were reviewed today include: . Review of the above  records demonstrates:    Assessment and Plan:   1. CAD: Stable. No chest pain. Continue ASA, Plavix, statin, Ace-inh, beta blocker.   2. HTN: BP controlled at home.   3. HLD: Followed in endocrine. He is on a statin.    Current medicines are reviewed at length with the patient today.  The patient does not have concerns regarding medicines.  The following changes have been made:  no change  Labs/ tests ordered today include:  No orders of the defined types were placed in this encounter.     Disposition:   FU with me in 12  months   Signed, Lauree Chandler, MD 07/02/2015 9:35 AM    [facility] [address], [place], [place]  [address] Phone: [phone]; Fax: [phone]

## 2015-07-02 NOTE — Patient Instructions (Signed)
Medication Instructions:  Your physician recommends that you continue on your current medications as directed. Please refer to the Current Medication list given to you today.   Labwork: none  Testing/Procedures: none  Follow-Up: Your physician wants you to follow-up in:  12 months.  You will receive a reminder letter in the mail two months in advance. If you don't receive a letter, please call our office to schedule the follow-up appointment.        

## 2015-07-21 ENCOUNTER — Other Ambulatory Visit: Payer: Self-pay | Admitting: Cardiovascular Disease

## 2015-08-01 ENCOUNTER — Encounter: Payer: Commercial Managed Care - HMO | Attending: Family Medicine | Admitting: *Deleted

## 2015-08-01 ENCOUNTER — Encounter: Payer: Self-pay | Admitting: *Deleted

## 2015-08-01 VITALS — Ht 70.0 in | Wt 239.9 lb

## 2015-08-01 DIAGNOSIS — Z713 Dietary counseling and surveillance: Secondary | ICD-10-CM | POA: Insufficient documentation

## 2015-08-01 DIAGNOSIS — Z794 Long term (current) use of insulin: Secondary | ICD-10-CM | POA: Diagnosis not present

## 2015-08-01 DIAGNOSIS — E118 Type 2 diabetes mellitus with unspecified complications: Secondary | ICD-10-CM

## 2015-08-01 DIAGNOSIS — E1165 Type 2 diabetes mellitus with hyperglycemia: Secondary | ICD-10-CM | POA: Diagnosis not present

## 2015-08-01 DIAGNOSIS — IMO0002 Reserved for concepts with insufficient information to code with codable children: Secondary | ICD-10-CM

## 2015-08-01 NOTE — Progress Notes (Signed)
Diabetes Self-Management Education  Visit Type: First/Initial  Appt. Start Time: 000 Appt. End Time: 0930  08/01/2015  [name], identified by name and date of birth, is a 59 y.o. male with a diagnosis of Diabetes: Type 2.   ASSESSMENT  Height 5\' 10"  (1.778 m), weight 239 lb 14.4 oz (108.818 kg). Body mass index is 34.42 kg/(m^2).      Diabetes Self-Management Education - 08/01/15 0819    Visit Information   Visit Type First/Initial   Initial Visit   Diabetes Type Type 2   Are you currently following a meal plan? No   Are you taking your medications as prescribed? Yes   Date Diagnosed 05/1993   Health Coping   How would you rate your overall health? Good   Psychosocial Assessment   Patient Belief/Attitude about Diabetes Motivated to manage diabetes   Self-management support Family;Doctor's office   Other persons present Patient   Patient Concerns Nutrition/Meal planning;Glycemic Control;Weight Control   Special Needs None   Learning Readiness Contemplating   Complications   Last HgB A1C per patient/outside source 12.4 %   Have you had a dilated eye exam in the past 12 months? Yes   Have you had a dental exam in the past 12 months? Yes   Are you checking your feet? Yes   How many days per week are you checking your feet? 7   Dietary Intake   Breakfast skips most days OR hot dog with chili OR cereal with 2% milk   Snack (morning) no   Lunch skips often or could bring his lunch but usually doesnt   Dinner meat, starch, fresh vegetables if his wife cooks which isn't often OR he finds whatever he can in the house   Beverage(s) water, diet soda   Exercise   Exercise Type Light (walking / raking leaves)   How many days per week to you exercise? 3.5   How many minutes per day do you exercise? 90   Total minutes per week of exercise 315   Patient Education   Previous Diabetes Education Yes (please comment)  06/2015   Disease state  Definition of diabetes, type 1 and  2, and the diagnosis of diabetes   Nutrition management  Role of diet in the treatment of diabetes and the relationship between the three main macronutrients and blood glucose level;Food label reading, portion sizes and measuring food.;Carbohydrate counting;Meal timing in regards to the patients' current diabetes medication.   Physical activity and exercise  Role of exercise on diabetes management, blood pressure control and cardiac health.   Monitoring Identified appropriate SMBG and/or A1C goals.  Addressed his issues with lancing device and location of finger to use for less pain   Chronic complications Relationship between chronic complications and blood glucose control   Psychosocial adjustment Worked with patient to identify barriers to care and solutions   Individualized Goals (developed by patient)   Nutrition Follow meal plan discussed   Physical Activity Exercise 3-5 times per week   Medications take my medication as prescribed   Monitoring  test blood glucose pre and post meals as discussed   Reducing Risk examine blood glucose patterns   Outcomes   Expected Outcomes Demonstrated interest in learning. Expect positive outcomes   Future DMSE 4-6 wks   Program Status Not Completed      Individualized Plan for Diabetes Self-Management Training:   Learning Objective:  Patient will have a greater understanding of diabetes self-management. Patient education plan  is to attend individual and/or group sessions per assessed needs and concerns.   Plan:   Patient Instructions  Plan:  Aim for 4 Carb Choices per meal (60 grams) +/- 1 either way  Aim for 0-2 Carbs per snack if hungry  Include protein in moderation with your meals and snacks Consider reading food labels for Total Carbohydrate of foods Continue with your activity level daily as tolerated Continue checking BG at alternate times per day  Continue taking medications as directed by MD Adjust the number on your lancing device  and use the mid-range # Consider using the side of your fingers instead of the middle pad which shouldn't hurt as much     Expected Outcomes:  Demonstrated interest in learning. Expect positive outcomes  Education material provided: Living Well with Diabetes, A1C conversion sheet and Meal plan card  If problems or questions, patient to contact team via:  Phone and Email  Future DSME appointment: 4-6 wks

## 2015-08-01 NOTE — Patient Instructions (Signed)
Plan:  Aim for 4 Carb Choices per meal (60 grams) +/- 1 either way  Aim for 0-2 Carbs per snack if hungry  Include protein in moderation with your meals and snacks Consider reading food labels for Total Carbohydrate of foods Continue with your activity level daily as tolerated Continue checking BG at alternate times per day  Continue taking medications as directed by MD Adjust the number on your lancing device and use the mid-range # Consider using the side of your fingers instead of the middle pad which shouldn't hurt as much

## 2015-09-12 ENCOUNTER — Encounter: Payer: Self-pay | Admitting: *Deleted

## 2015-09-12 ENCOUNTER — Encounter: Payer: Commercial Managed Care - HMO | Attending: Family Medicine | Admitting: *Deleted

## 2015-09-12 VITALS — Ht 70.0 in | Wt 244.3 lb

## 2015-09-12 DIAGNOSIS — Z713 Dietary counseling and surveillance: Secondary | ICD-10-CM | POA: Insufficient documentation

## 2015-09-12 DIAGNOSIS — Z794 Long term (current) use of insulin: Secondary | ICD-10-CM | POA: Insufficient documentation

## 2015-09-12 DIAGNOSIS — E1165 Type 2 diabetes mellitus with hyperglycemia: Secondary | ICD-10-CM | POA: Diagnosis present

## 2015-09-12 DIAGNOSIS — E118 Type 2 diabetes mellitus with unspecified complications: Secondary | ICD-10-CM

## 2015-09-12 NOTE — Patient Instructions (Signed)
Plan:  Aim for 4 Carb Choices per meal (60 grams) +/- 1 either way  Aim for 0-2 Carbs per snack if hungry  Include protein in moderation with your meals and snacks Continue reading food labels for Total Carbohydrate of foods Continue with your activity level daily as tolerated Continue checking BG at alternate times per day  Continue taking medications as directed by MD I'm glad that adjusting the number on your lancing device and use the mid-range # Continue using the side of your fingers instead of the middle pad which shouldn't hurt as much [place] job! Very proud of your success and improvement in A1c from 12 to 8%

## 2015-09-12 NOTE — Progress Notes (Signed)
Diabetes Self-Management Education  Visit Type:  Follow-up  Appt. Start Time: 0830 Appt. End Time: 0930  09/12/2015  [name], identified by name and date of birth, is a 59 y.o. male with a diagnosis of Diabetes: Type 2.   ASSESSMENT  Height 5\' 10"  (1.778 m), weight 244 lb 4.8 oz (110.814 kg). Body mass index is 35.05 kg/(m^2).       Diabetes Self-Management Education - 09/12/15 0854    Health Coping   How would you rate your overall health? Good   Psychosocial Assessment   Patient Belief/Attitude about Diabetes Motivated to manage diabetes   Self-care barriers None   Self-management support Doctor's office;Family   Patient Concerns Nutrition/Meal planning;Glycemic Control   Special Needs None   Learning Readiness Change in progress   Complications   Last HgB A1C per patient/outside source 8 %  per patient   How often do you check your blood sugar? 1-2 times/day   Fasting Blood glucose range (mg/dL) 130-179   Postprandial Blood glucose range (mg/dL) 130-179   Number of hypoglycemic episodes per month 0   Have you had a dilated eye exam in the past 12 months? Yes   Have you had a dental exam in the past 12 months? Yes   Are you checking your feet? Yes   How many days per week are you checking your feet? 7   Dietary Intake   Breakfast cereal with milk on work days   [employer] with 6" sub. 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   Medications Reviewed patients medication for diabetes, action, purpose, timing of dose and side effects.   Monitoring Identified  appropriate SMBG and/or A1C goals.  He is more willing to test now that he understands the numbers and the factors that effect the numbers now.    Individualized Goals (developed by patient)   Nutrition Follow meal plan discussed   Physical Activity Exercise 3-5 times per week   Medications take my medication as prescribed   Monitoring  test blood glucose pre and post meals as discussed   Reducing Risk examine blood glucose patterns   Patient Self-Evaluation of Goals - Patient rates self as meeting previously set goals (% of time)   Nutrition >75%   Physical Activity >75%   Medications >75%   Monitoring >75%   Problem Solving >75%   Reducing Risk >75%   Health Coping >75%   Outcomes   Program Status Completed   Subsequent Visit   Since your last visit have you continued or begun to take your medications as prescribed? Yes   Since your last visit, are you checking your blood glucose at least once a day? Yes      Learning Objective:  Patient will have a greater understanding of diabetes self-management. Patient education plan is to attend individual and/or group sessions per assessed needs and concerns.   Plan:   Patient Instructions  Plan:  Aim for 4 Carb Choices per meal (60 grams) +/- 1 either way  Aim for 0-2 Carbs per snack if hungry  Include protein in  moderation with your meals and snacks Continue reading food labels for Total Carbohydrate of foods Continue with your activity level daily as tolerated Continue checking BG at alternate times per day  Continue taking medications as directed by MD I'm glad that adjusting the number on your lancing device and use the mid-range # Continue using the side of your fingers instead of the middle pad which shouldn't hurt as much [place] [employer]! Very proud of your success and improvement in A1c from 12 to 8%      Expected Outcomes:  Demonstrated interest in learning. Expect positive outcomes  Education material provided: My Plate  Placemat and Diabetes Medication handout  If problems or questions, patient to contact team via:  Phone and Email  Future DSME appointment: - 3-4 months

## 2015-11-07 ENCOUNTER — Ambulatory Visit
Admission: RE | Admit: 2015-11-07 | Discharge: 2015-11-07 | Disposition: A | Discharge status: Home / Self Care | Payer: Commercial Managed Care - HMO | Source: Ambulatory Visit | Attending: Family Medicine | Admitting: Family Medicine

## 2015-11-07 ENCOUNTER — Other Ambulatory Visit: Payer: Self-pay | Admitting: Family Medicine

## 2015-11-07 DIAGNOSIS — M25551 Pain in right hip: Secondary | ICD-10-CM

## 2015-11-29 ENCOUNTER — Other Ambulatory Visit: Payer: Self-pay | Admitting: Family Medicine

## 2016-01-19 ENCOUNTER — Other Ambulatory Visit: Payer: Self-pay | Admitting: Family Medicine

## 2016-01-30 ENCOUNTER — Ambulatory Visit: Payer: Commercial Managed Care - HMO | Admitting: *Deleted

## 2016-02-27 ENCOUNTER — Encounter: Payer: Self-pay | Admitting: Cardiovascular Disease

## 2016-04-14 ENCOUNTER — Telehealth: Payer: Self-pay | Admitting: Cardiovascular Disease

## 2016-04-14 NOTE — Telephone Encounter (Signed)
New Message  Wife called  Pt c/o of Chest Pain: STAT if CP now or developed within 24 hours  1. Are you having CP right now? Yes   2. Are you experiencing any other symptoms (ex. SOB, nausea, vomiting, sweating)? SOB  3. How long have you been experiencing CP? 1 week  4. Is your CP continuous or coming and going? Coming and going  5. Have you taken Nitroglycerin? No   Comment: pt wife wife wants to discuss pt being seen sooner for symptoms by provider. Please call back to discuss. ?

## 2016-04-14 NOTE — Telephone Encounter (Signed)
The pts wife is requesting a sooner appt for the pt due to on and off CP. She is advised that we maybe able to work him today. She states that she will contact the pt to see if he can come in today for an OV and call us back.

## 2016-04-15 NOTE — Telephone Encounter (Signed)
LMTCB for pt at [phone]

## 2016-04-23 NOTE — Telephone Encounter (Signed)
I left message on home and cell numbers listed for pt to call office.

## 2016-04-24 NOTE — Telephone Encounter (Signed)
I spoke with Philip Black and told him we had been trying to get in touch with him regarding appt. He reports chest pain every now and then and would like sooner appt than one that is currently scheduled in September.  I made appt for him to see Dr. Angelena Form on July 26,2017 at 8:30.

## 2016-04-24 NOTE — Telephone Encounter (Signed)
F/U   Returning nurse call. Please call.

## 2016-04-30 ENCOUNTER — Encounter: Payer: Self-pay | Admitting: Cardiovascular Disease

## 2016-04-30 ENCOUNTER — Encounter: Payer: Self-pay | Admitting: *Deleted

## 2016-04-30 ENCOUNTER — Ambulatory Visit (INDEPENDENT_AMBULATORY_CARE_PROVIDER_SITE_OTHER): Payer: Commercial Managed Care - HMO | Admitting: Cardiovascular Disease

## 2016-04-30 ENCOUNTER — Telehealth: Payer: Self-pay | Admitting: Cardiovascular Disease

## 2016-04-30 VITALS — BP 128/88 | HR 88 | Ht 70.0 in | Wt 242.8 lb

## 2016-04-30 DIAGNOSIS — I25110 Atherosclerotic heart disease of native coronary artery with unstable angina pectoris: Secondary | ICD-10-CM | POA: Diagnosis not present

## 2016-04-30 DIAGNOSIS — I10 Essential (primary) hypertension: Secondary | ICD-10-CM

## 2016-04-30 LAB — CBC WITH DIFFERENTIAL/PLATELET
Basophils Absolute: 0 {cells}/uL (ref 0–200)
Basophils Relative: 0 %
Eosinophils Absolute: 232 {cells}/uL (ref 15–500)
Eosinophils Relative: 2 %
HCT: 48.7 % (ref 38.5–50.0)
Hemoglobin: 15.8 g/dL (ref 13.2–17.1)
Lymphocytes Relative: 26 %
Lymphs Abs: 3016 {cells}/uL (ref 850–3900)
MCH: 30.7 pg (ref 27.0–33.0)
MCHC: 32.4 g/dL (ref 32.0–36.0)
MCV: 94.7 fL (ref 80.0–100.0)
MPV: 10.8 fL (ref 7.5–12.5)
Monocytes Absolute: 580 {cells}/uL (ref 200–950)
Monocytes Relative: 5 %
Neutro Abs: 7772 {cells}/uL (ref 1500–7800)
Neutrophils Relative %: 67 %
Platelets: 289 K/uL (ref 140–400)
RBC: 5.14 MIL/uL (ref 4.20–5.80)
RDW: 13.5 % (ref 11.0–15.0)
WBC: 11.6 K/uL — ABNORMAL HIGH (ref 3.8–10.8)

## 2016-04-30 LAB — BASIC METABOLIC PANEL WITH GFR
BUN: 16 mg/dL (ref 7–25)
CO2: 23 mmol/L (ref 20–31)
Calcium: 9.9 mg/dL (ref 8.6–10.3)
Chloride: 100 mmol/L (ref 98–110)
Creat: 1.15 mg/dL (ref 0.70–1.33)
Glucose, Bld: 193 mg/dL — ABNORMAL HIGH (ref 65–99)
Potassium: 4.5 mmol/L (ref 3.5–5.3)
Sodium: 137 mmol/L (ref 135–146)

## 2016-04-30 LAB — PROTIME-INR
INR: 1
Prothrombin Time: 10.4 s (ref 9.0–11.5)

## 2016-04-30 MED ORDER — NITROGLYCERIN 0.4 MG SL SUBL: 0.4000 mg | SUBLINGUAL_TABLET | SUBLINGUAL | 6 refills | Status: DC | PRN

## 2016-04-30 NOTE — Patient Instructions (Signed)
Medication Instructions:  Your physician recommends that you continue on your current medications as directed. Please refer to the Current Medication list given to you today. A prescription has been sent to your pharmacy for Nitroglycerin to use as needed.   Labwork: Lab work to be done today--CBC, [employer], PT  Testing/Procedures: Your physician has requested that you have a cardiac catheterization. Cardiac catheterization is used to diagnose and/or treat various heart conditions. Doctors may recommend this procedure for a number of different reasons. The most common reason is to evaluate chest pain. Chest pain can be a symptom of coronary artery disease (CAD), and cardiac catheterization can show whether plaque is narrowing or blocking your heart's arteries. This procedure is also used to evaluate the valves, as well as measure the blood flow and oxygen levels in different parts of your heart. For further information please visit [email or web address]. Please follow instruction sheet, as given.  Scheduled for August 2,2017    Follow-Up: Your physician recommends that you schedule a follow-up appointment in: about 4 weeks with PA or NP   Any Other Special Instructions Will Be Listed Below (If Applicable).     If you need a refill on your cardiac medications before your next appointment, please call your pharmacy.

## 2016-04-30 NOTE — Telephone Encounter (Signed)
I spoke with Philip Black and told her pt has been notified not to use NTG and Viagra in the same 24 hour period.

## 2016-04-30 NOTE — Telephone Encounter (Signed)
New Message  Natalie from [facility] wants to make sure MD knows about pt taking viagra with nitroGlycerin. Please call back. wy

## 2016-04-30 NOTE — Progress Notes (Signed)
Chief Complaint  Patient presents with  . Follow-up     History of Present Illness: 60 yo male with history of CAD, HTN, HLD, DM, OSA here today for cardiac follow up. He has been followed in the past by Dr. Olevia Perches. In May 2009 he had an anterior MI treated with a drug-eluting stent to the LAD. He had residual 80% narrowing in the posterolateral branch and 90% narrowing of the marginal branch the circumflex artery which was managed medically. He has not tolerated beta blockers due to bradycardia.   He is here today for follow up. He describes chest pressure over the last month with exertion. There is radiation of the pain to his neck. This resolves with rest. He is taking all meds.   Primary Care Physician: Cammy Copa, MD Endocrine: Buddy Duty   Last Lipid Profile: Followed in Endocrine   Past Medical History:  Diagnosis Date  . Arthritis   . CAD S/P percutaneous coronary angioplasty   . Diabetes mellitus type 2  . Hypertension   . Metabolic syndrome   . OSA (obstructive sleep apnea)     Past Surgical History:  Procedure Laterality Date  . TONSILLECTOMY      Current Outpatient Prescriptions  Medication Sig Dispense Refill  . amLODipine (NORVASC) 5 MG tablet Take 1 tablet (5 mg total) by mouth daily. 90 tablet 3  . aspirin 81 MG tablet Take 81 mg by mouth daily.      . clopidogrel (PLAVIX) 75 MG tablet TAKE 1 TABLET (75 MG TOTAL) BY MOUTH DAILY. 30 tablet 11  . colesevelam (WELCHOL) 625 MG tablet Take 3 tablets (1,875 mg total) by mouth 2 (two) times daily with a meal. 60 tablet 5  . CRESTOR 40 MG tablet TAKE 1/2 TABLET DAILY 15 tablet 2  . ezetimibe (ZETIA) 10 MG tablet Take 10 mg by mouth daily.    . Insulin Pen Needle 32G X 4 MM MISC 1 each by Does not apply route daily. 100 each 6  . INVOKAMET 50-1000 MG TABS Take 2 tablets by mouth daily.  11  . LEVEMIR FLEXTOUCH 100 UNIT/ML Pen INJECT 0.1 MLS (10 UNITS TOTAL) INTO THE SKIN AT BEDTIME. 3 mL 3  . quinapril  (ACCUPRIL) 40 MG tablet TAKE 1 TABLET EVERY DAY FOR BLOOD PRESSURE 30 tablet 0  . sildenafil (VIAGRA) 100 MG tablet Take 1 tablet (100 mg total) by mouth daily as needed for erectile dysfunction. 10 tablet 5  . Vitamin D, Ergocalciferol, (DRISDOL) 50000 units CAPS capsule Take 1 capsule by mouth 3 (three) times a week.  0   No current facility-administered medications for this visit.     No Known Allergies  Social History   Social History  . Marital status: Married    Spouse name: N/A  . Number of children: 3  . Years of education: N/A   Occupational History  . Trash collection Unemployed   Social History Main Topics  . Smoking status: Never Smoker  . Smokeless tobacco: Never Used  . Alcohol use No  . Drug use: No  . Sexual activity: Yes   Other Topics Concern  . Not on file   Social History Narrative  . No narrative on file    Family History  Problem Relation Age of Onset  . Diabetes Mother   . Heart attack Father     Review of Systems:  As stated in the HPI and otherwise negative.   BP 128/88 (BP Location: Left Arm, Patient Position:  Sitting, Cuff Size: Normal)   Pulse 88   Ht 5\' 10"  (1.778 m)   Wt 242 lb 12.8 oz (110.1 kg)   SpO2 97%   BMI 34.84 kg/m   Physical Examination: General: Well developed, well nourished, NAD  HEENT: OP clear, mucus membranes moist  SKIN: warm, dry. No rashes. Neuro: No focal deficits  Musculoskeletal: Muscle strength 5/5 all ext  Psychiatric: Mood and affect normal  Neck: No JVD, no carotid bruits, no thyromegaly, no lymphadenopathy.  Lungs:Clear bilaterally, no wheezes, rhonci, crackles Cardiovascular: Regular rate and rhythm. No murmurs, gallops or rubs. Abdomen:Soft. Bowel sounds present. Non-tender.  Extremities: No lower extremity edema. Pulses are 2 + in the bilateral DP/PT.  EKG:  EKG is ordered today. The ekg ordered today demonstrates NSR, rate 90 bpm. No ischemic changes  Recent Labs: No results found for  requested labs within last 8760 hours.   Lipid Panel   Wt Readings from Last 3 Encounters:  04/30/16 242 lb 12.8 oz (110.1 kg)  09/12/15 244 lb 4.8 oz (110.8 kg)  08/01/15 239 lb 14.4 oz (108.8 kg)     Other studies Reviewed: Additional studies/ records that were reviewed today include: . Review of the above records demonstrates:    Assessment and Plan:   1. CAD with unstable angina: He is having exertional chest pain with minimal exertion c/w class 3 unstable angina. Known CAD with prior stenting of the LAD and branch vessel disease. Will plan cardiac cath at [facility] next week. Pre-cath labs today. Risks and benefits of procedure reviewed. He agrees to proceed. Continue ASA, Plavix, statin, Ace-inh, beta blocker. I have offered cath tomorrow but he refuses since his wife is out of town. Will add SL NTG prn.   2. HTN: BP controlled  3. HLD: Followed in endocrine. He is on a statin.    Current medicines are reviewed at length with the patient today.  The patient does not have concerns regarding medicines.  The following changes have been made:  no change  Labs/ tests ordered today include:  No orders of the defined types were placed in this encounter.    Disposition:   FU with me after cath    Signed, Lauree Chandler, MD 04/30/2016 8:50 AM    [facility] [name], [place], [place]  [address] Phone: [phone]; Fax: [phone]

## 2016-05-07 ENCOUNTER — Encounter (HOSPITAL_COMMUNITY): Payer: Self-pay | Admitting: General Practice

## 2016-05-07 ENCOUNTER — Encounter (HOSPITAL_COMMUNITY)
Admission: RE | Disposition: A | Discharge status: Home / Self Care | Payer: Self-pay | Source: Ambulatory Visit | Attending: Cardiovascular Disease

## 2016-05-07 ENCOUNTER — Ambulatory Visit (HOSPITAL_COMMUNITY)
Admission: RE | Admit: 2016-05-07 | Discharge: 2016-05-08 | Disposition: A | Discharge status: Home / Self Care | Payer: Commercial Managed Care - HMO | Source: Ambulatory Visit | Attending: Cardiovascular Disease | Admitting: Cardiovascular Disease

## 2016-05-07 DIAGNOSIS — Z7902 Long term (current) use of antithrombotics/antiplatelets: Secondary | ICD-10-CM | POA: Diagnosis not present

## 2016-05-07 DIAGNOSIS — I25110 Atherosclerotic heart disease of native coronary artery with unstable angina pectoris: Secondary | ICD-10-CM | POA: Diagnosis not present

## 2016-05-07 DIAGNOSIS — Z7982 Long term (current) use of aspirin: Secondary | ICD-10-CM | POA: Insufficient documentation

## 2016-05-07 DIAGNOSIS — E8881 Metabolic syndrome: Secondary | ICD-10-CM | POA: Diagnosis not present

## 2016-05-07 DIAGNOSIS — Z794 Long term (current) use of insulin: Secondary | ICD-10-CM | POA: Diagnosis not present

## 2016-05-07 DIAGNOSIS — T82855A Stenosis of coronary artery stent, initial encounter: Secondary | ICD-10-CM | POA: Diagnosis not present

## 2016-05-07 DIAGNOSIS — Y712 Prosthetic and other implants, materials and accessory cardiovascular devices associated with adverse incidents: Secondary | ICD-10-CM | POA: Insufficient documentation

## 2016-05-07 DIAGNOSIS — Z8249 Family history of ischemic heart disease and other diseases of the circulatory system: Secondary | ICD-10-CM | POA: Insufficient documentation

## 2016-05-07 DIAGNOSIS — R001 Bradycardia, unspecified: Secondary | ICD-10-CM | POA: Diagnosis not present

## 2016-05-07 DIAGNOSIS — G4733 Obstructive sleep apnea (adult) (pediatric): Secondary | ICD-10-CM | POA: Diagnosis not present

## 2016-05-07 DIAGNOSIS — I252 Old myocardial infarction: Secondary | ICD-10-CM | POA: Insufficient documentation

## 2016-05-07 DIAGNOSIS — E785 Hyperlipidemia, unspecified: Secondary | ICD-10-CM | POA: Diagnosis not present

## 2016-05-07 DIAGNOSIS — M1990 Unspecified osteoarthritis, unspecified site: Secondary | ICD-10-CM | POA: Insufficient documentation

## 2016-05-07 DIAGNOSIS — Z833 Family history of diabetes mellitus: Secondary | ICD-10-CM | POA: Diagnosis not present

## 2016-05-07 DIAGNOSIS — I10 Essential (primary) hypertension: Secondary | ICD-10-CM | POA: Diagnosis not present

## 2016-05-07 DIAGNOSIS — Z955 Presence of coronary angioplasty implant and graft: Secondary | ICD-10-CM

## 2016-05-07 DIAGNOSIS — E119 Type 2 diabetes mellitus without complications: Secondary | ICD-10-CM | POA: Diagnosis not present

## 2016-05-07 DIAGNOSIS — I200 Unstable angina: Secondary | ICD-10-CM

## 2016-05-07 HISTORY — DX: Pure hypercholesterolemia, unspecified: E78.00

## 2016-05-07 HISTORY — DX: Unstable angina: I20.0

## 2016-05-07 HISTORY — DX: Type 2 diabetes mellitus without complications: E11.9

## 2016-05-07 HISTORY — PX: CARDIAC CATHETERIZATION: SHX172

## 2016-05-07 LAB — GLUCOSE, CAPILLARY
GLUCOSE-CAPILLARY: 208 mg/dL — AB (ref 65–99)
GLUCOSE-CAPILLARY: 165 mg/dL — AB (ref 65–99)
Glucose-Capillary: 162 mg/dL — ABNORMAL HIGH (ref 65–99)
Glucose-Capillary: 207 mg/dL — ABNORMAL HIGH (ref 65–99)

## 2016-05-07 LAB — POCT ACTIVATED CLOTTING TIME
Activated Clotting Time: 252 seconds
Activated Clotting Time: 632 seconds

## 2016-05-07 SURGERY — LEFT HEART CATH AND CORONARY ANGIOGRAPHY

## 2016-05-07 MED ORDER — HEPARIN SODIUM (PORCINE) 1000 UNIT/ML IJ SOLN
INTRAMUSCULAR | Status: DC | PRN
  Administered 2016-05-07: 7000 [IU] via INTRAVENOUS
  Administered 2016-05-07: 5000 [IU] via INTRAVENOUS
  Administered 2016-05-07: 2000 [IU] via INTRAVENOUS

## 2016-05-07 MED ORDER — FENTANYL CITRATE (PF) 100 MCG/2ML IJ SOLN
INTRAMUSCULAR | Status: DC | PRN
  Administered 2016-05-07: 50 ug via INTRAVENOUS

## 2016-05-07 MED ORDER — LIDOCAINE HCL (PF) 1 % IJ SOLN
INTRAMUSCULAR | Status: DC | PRN
  Administered 2016-05-07: 2 mL

## 2016-05-07 MED ORDER — ACETAMINOPHEN 325 MG PO TABS
650.0000 mg | ORAL_TABLET | ORAL | Status: DC | PRN
  Administered 2016-05-07: 650 mg via ORAL
  Filled 2016-05-07: qty 2

## 2016-05-07 MED ORDER — VERAPAMIL HCL 2.5 MG/ML IV SOLN
INTRAVENOUS | Status: DC | PRN
  Administered 2016-05-07: 10 mL via INTRA_ARTERIAL

## 2016-05-07 MED ORDER — FENTANYL CITRATE (PF) 100 MCG/2ML IJ SOLN
INTRAMUSCULAR | Status: AC
  Filled 2016-05-07: qty 2

## 2016-05-07 MED ORDER — LIDOCAINE HCL (PF) 1 % IJ SOLN
INTRAMUSCULAR | Status: AC
  Filled 2016-05-07: qty 30

## 2016-05-07 MED ORDER — ASPIRIN 81 MG PO CHEW
81.0000 mg | CHEWABLE_TABLET | ORAL | Status: AC
  Administered 2016-05-07: 81 mg via ORAL

## 2016-05-07 MED ORDER — NITROGLYCERIN 0.4 MG SL SUBL: 0.4000 mg | SUBLINGUAL_TABLET | SUBLINGUAL | Status: DC | PRN

## 2016-05-07 MED ORDER — ANGIOPLASTY BOOK
Freq: Once | Status: AC
  Administered 2016-05-07: 12:00:00
  Filled 2016-05-07: qty 1

## 2016-05-07 MED ORDER — ONDANSETRON HCL 4 MG/2ML IJ SOLN: 4.0000 mg | Freq: Four times a day (QID) | INTRAMUSCULAR | Status: DC | PRN

## 2016-05-07 MED ORDER — MIDAZOLAM HCL 2 MG/2ML IJ SOLN
INTRAMUSCULAR | Status: AC
  Filled 2016-05-07: qty 2

## 2016-05-07 MED ORDER — HEPARIN SODIUM (PORCINE) 1000 UNIT/ML IJ SOLN
INTRAMUSCULAR | Status: AC
  Filled 2016-05-07: qty 1

## 2016-05-07 MED ORDER — COLESEVELAM HCL 625 MG PO TABS
1875.0000 mg | ORAL_TABLET | Freq: Two times a day (BID) | ORAL | Status: DC
  Administered 2016-05-07 – 2016-05-08 (×2): 1875 mg via ORAL
  Filled 2016-05-07 (×3): qty 3

## 2016-05-07 MED ORDER — EZETIMIBE 10 MG PO TABS
10.0000 mg | ORAL_TABLET | Freq: Every day | ORAL | Status: DC
  Administered 2016-05-07 – 2016-05-08 (×2): 10 mg via ORAL
  Filled 2016-05-07 (×2): qty 1

## 2016-05-07 MED ORDER — IOPAMIDOL (ISOVUE-370) INJECTION 76%
INTRAVENOUS | Status: AC
  Filled 2016-05-07: qty 200

## 2016-05-07 MED ORDER — VERAPAMIL HCL 2.5 MG/ML IV SOLN
INTRAVENOUS | Status: AC
  Filled 2016-05-07: qty 2

## 2016-05-07 MED ORDER — CLOPIDOGREL BISULFATE 75 MG PO TABS
75.0000 mg | ORAL_TABLET | Freq: Once | ORAL | Status: AC
  Administered 2016-05-07: 75 mg via ORAL

## 2016-05-07 MED ORDER — SODIUM CHLORIDE 0.9% FLUSH
3.0000 mL | Freq: Two times a day (BID) | INTRAVENOUS | Status: DC
  Administered 2016-05-07 (×2): 3 mL via INTRAVENOUS

## 2016-05-07 MED ORDER — ANGIOPLASTY BOOK
Freq: Once | Status: AC
  Filled 2016-05-07: qty 1

## 2016-05-07 MED ORDER — ASPIRIN 81 MG PO CHEW
CHEWABLE_TABLET | ORAL | Status: AC
  Filled 2016-05-07: qty 1

## 2016-05-07 MED ORDER — SODIUM CHLORIDE 0.9% FLUSH: 3.0000 mL | INTRAVENOUS | Status: DC | PRN

## 2016-05-07 MED ORDER — INSULIN DETEMIR 100 UNIT/ML ~~LOC~~ SOLN
10.0000 [IU] | Freq: Every day | SUBCUTANEOUS | Status: DC
  Administered 2016-05-07: 22:00:00 10 [IU] via SUBCUTANEOUS
  Filled 2016-05-07 (×2): qty 0.1

## 2016-05-07 MED ORDER — SODIUM CHLORIDE 0.9% FLUSH: 3.0000 mL | Freq: Two times a day (BID) | INTRAVENOUS | Status: DC

## 2016-05-07 MED ORDER — HEPARIN (PORCINE) IN NACL 2-0.9 UNIT/ML-% IJ SOLN
INTRAMUSCULAR | Status: DC | PRN
  Administered 2016-05-07: 1000 mL

## 2016-05-07 MED ORDER — SODIUM CHLORIDE 0.9 % IV SOLN
INTRAVENOUS | Status: DC
  Administered 2016-05-07: 07:00:00 via INTRAVENOUS

## 2016-05-07 MED ORDER — TICAGRELOR 90 MG PO TABS
90.0000 mg | ORAL_TABLET | Freq: Two times a day (BID) | ORAL | Status: DC
  Administered 2016-05-07 – 2016-05-08 (×2): 90 mg via ORAL
  Filled 2016-05-07 (×2): qty 1

## 2016-05-07 MED ORDER — CLOPIDOGREL BISULFATE 75 MG PO TABS
ORAL_TABLET | ORAL | Status: AC
  Filled 2016-05-07: qty 1

## 2016-05-07 MED ORDER — ROSUVASTATIN CALCIUM 20 MG PO TABS
20.0000 mg | ORAL_TABLET | Freq: Every day | ORAL | Status: DC
  Administered 2016-05-07 – 2016-05-08 (×2): 20 mg via ORAL
  Filled 2016-05-07 (×2): qty 1

## 2016-05-07 MED ORDER — SODIUM CHLORIDE 0.9 % IV SOLN: INTRAVENOUS | Status: AC

## 2016-05-07 MED ORDER — TICAGRELOR 90 MG PO TABS
180.0000 mg | ORAL_TABLET | Freq: Once | ORAL | Status: AC
  Administered 2016-05-07: 180 mg via ORAL
  Filled 2016-05-07: qty 2

## 2016-05-07 MED ORDER — SODIUM CHLORIDE 0.9 % IV SOLN: 250.0000 mL | INTRAVENOUS | Status: DC | PRN

## 2016-05-07 MED ORDER — HEPARIN (PORCINE) IN NACL 2-0.9 UNIT/ML-% IJ SOLN
INTRAMUSCULAR | Status: AC
  Filled 2016-05-07: qty 1000

## 2016-05-07 MED ORDER — IOPAMIDOL (ISOVUE-370) INJECTION 76%
INTRAVENOUS | Status: DC | PRN
  Administered 2016-05-07: 130 mL via INTRA_ARTERIAL

## 2016-05-07 MED ORDER — MIDAZOLAM HCL 2 MG/2ML IJ SOLN
INTRAMUSCULAR | Status: DC | PRN
  Administered 2016-05-07: 2 mg via INTRAVENOUS

## 2016-05-07 MED ORDER — NITROGLYCERIN 1 MG/10 ML FOR IR/CATH LAB
INTRA_ARTERIAL | Status: AC
  Filled 2016-05-07: qty 10

## 2016-05-07 MED ORDER — AMLODIPINE BESYLATE 5 MG PO TABS
5.0000 mg | ORAL_TABLET | Freq: Every day | ORAL | Status: DC
  Administered 2016-05-07 – 2016-05-08 (×2): 5 mg via ORAL
  Filled 2016-05-07 (×2): qty 1

## 2016-05-07 MED ORDER — QUINAPRIL HCL 10 MG PO TABS
40.0000 mg | ORAL_TABLET | Freq: Every day | ORAL | Status: DC
  Administered 2016-05-07 – 2016-05-08 (×2): 40 mg via ORAL
  Filled 2016-05-07 (×2): qty 4

## 2016-05-07 MED ORDER — ASPIRIN EC 81 MG PO TBEC
81.0000 mg | DELAYED_RELEASE_TABLET | Freq: Every day | ORAL | Status: DC
  Administered 2016-05-08: 09:00:00 81 mg via ORAL
  Filled 2016-05-07: qty 1

## 2016-05-07 SURGICAL SUPPLY — 20 items
BALLN EMERGE MR 2.5X12 (BALLOONS) ×2
BALLN ~~LOC~~ EMERGE MR 3.25X8 (BALLOONS) ×2
BALLOON EMERGE MR 2.5X12 (BALLOONS) ×1 IMPLANT
BALLOON ~~LOC~~ EMERGE MR 3.25X8 (BALLOONS) ×1 IMPLANT
CATH INFINITI 5 FR JL3.5 (CATHETERS) ×2 IMPLANT
CATH INFINITI 5FR ANG PIGTAIL (CATHETERS) ×2 IMPLANT
CATH INFINITI JR4 5F (CATHETERS) ×2 IMPLANT
CATH VISTA GUIDE 6FR JL3.5 (CATHETERS) ×2 IMPLANT
CATH VISTA GUIDE 6FR XBLAD3.5 (CATHETERS) ×2 IMPLANT
DEVICE RAD COMP TR BAND LRG (VASCULAR PRODUCTS) ×2 IMPLANT
GLIDESHEATH SLEND SS 6F .021 (SHEATH) ×2 IMPLANT
KIT ENCORE 26 ADVANTAGE (KITS) ×2 IMPLANT
KIT HEART LEFT (KITS) ×2 IMPLANT
PACK CARDIAC CATHETERIZATION (CUSTOM PROCEDURE TRAY) ×2 IMPLANT
STENT PROMUS PREM MR 3.0X12 (Permanent Stent) ×2 IMPLANT
SYR MEDRAD MARK V 150ML (SYRINGE) ×2 IMPLANT
TRANSDUCER W/STOPCOCK (MISCELLANEOUS) ×2 IMPLANT
TUBING CIL FLEX 10 FLL-RA (TUBING) ×2 IMPLANT
WIRE COUGAR XT STRL 190CM (WIRE) ×4 IMPLANT
WIRE SAFE-T 1.5MM-J .035X260CM (WIRE) ×2 IMPLANT

## 2016-05-07 NOTE — Progress Notes (Signed)
TR BAND REMOVAL  LOCATION:    right radial  DEFLATED PER PROTOCOL:    Yes.    TIME BAND OFF / DRESSING APPLIED:    1330   SITE UPON ARRIVAL:    Level 0  SITE AFTER BAND REMOVAL:    Level 0  CIRCULATION SENSATION AND MOVEMENT:    Within Normal Limits   Yes.    COMMENTS:   Rechecked site at 1400 with no change in assessment noted.

## 2016-05-07 NOTE — Care Management Note (Signed)
Case Management Note  Patient Details  Name: Philip Black MRN: [id number] Date of Birth: [birth year]  Subjective/Objective:     Patient is s/p stent intervention , will start Brilinta and will be in Twilight study.  NCM will cont to follow for dc needs.               Action/Plan:   Expected Discharge Date:                  Expected Discharge Plan:  Home/Self Care  In-House Referral:     Discharge planning Services  CM Consult  Post Acute Care Choice:    Choice offered to:     DME Arranged:    DME Agency:     HH Arranged:    HH Agency:     Status of Service:  In process, will continue to follow  If discussed at Long Length of Stay Meetings, dates discussed:    Additional Comments:  Zenon Mayo, RN 05/07/2016, 5:14 PM

## 2016-05-07 NOTE — Interval H&P Note (Signed)
History and Physical Interval Note:  05/07/2016 8:30 AM  Philip Black  has presented today for cardiac cath with the diagnosis of unstable angina. The various methods of treatment have been discussed with the patient and family. After consideration of risks, benefits and other options for treatment, the patient has consented to  Procedure(s): Left Heart Cath and Coronary Angiography (N/A) as a surgical intervention .  The patient's history has been reviewed, patient examined, no change in status, stable for surgery.  I have reviewed the patient's chart and labs.  Questions were answered to the patient's satisfaction.    Cath Lab Visit (complete for each Cath Lab visit)  Clinical Evaluation Leading to the Procedure:   ACS: No.  Non-ACS:    Anginal Classification: CCS III  Anti-ischemic medical therapy: Minimal Therapy (1 class of medications)  Non-Invasive Test Results: No non-invasive testing performed  Prior CABG: No previous CABG         Lauree Chandler

## 2016-05-07 NOTE — H&P (View-Only) (Signed)
Chief Complaint  Patient presents with  . Follow-up     History of Present Illness: 60 yo male with history of CAD, HTN, HLD, DM, OSA here today for cardiac follow up. He has been followed in the past by Dr. Olevia Perches. In May 2009 he had an anterior MI treated with a drug-eluting stent to the LAD. He had residual 80% narrowing in the posterolateral branch and 90% narrowing of the marginal branch the circumflex artery which was managed medically. He has not tolerated beta blockers due to bradycardia.   He is here today for follow up. He describes chest pressure over the last month with exertion. There is radiation of the pain to his neck. This resolves with rest. He is taking all meds.   Primary Care Physician: Cammy Copa, MD Endocrine: Buddy Duty   Last Lipid Profile: Followed in Endocrine   Past Medical History:  Diagnosis Date  . Arthritis   . CAD S/P percutaneous coronary angioplasty   . Diabetes mellitus type 2  . Hypertension   . Metabolic syndrome   . OSA (obstructive sleep apnea)     Past Surgical History:  Procedure Laterality Date  . TONSILLECTOMY      Current Outpatient Prescriptions  Medication Sig Dispense Refill  . amLODipine (NORVASC) 5 MG tablet Take 1 tablet (5 mg total) by mouth daily. 90 tablet 3  . aspirin 81 MG tablet Take 81 mg by mouth daily.      . clopidogrel (PLAVIX) 75 MG tablet TAKE 1 TABLET (75 MG TOTAL) BY MOUTH DAILY. 30 tablet 11  . colesevelam (WELCHOL) 625 MG tablet Take 3 tablets (1,875 mg total) by mouth 2 (two) times daily with a meal. 60 tablet 5  . CRESTOR 40 MG tablet TAKE 1/2 TABLET DAILY 15 tablet 2  . ezetimibe (ZETIA) 10 MG tablet Take 10 mg by mouth daily.    . Insulin Pen Needle 32G X 4 MM MISC 1 each by Does not apply route daily. 100 each 6  . INVOKAMET 50-1000 MG TABS Take 2 tablets by mouth daily.  11  . LEVEMIR FLEXTOUCH 100 UNIT/ML Pen INJECT 0.1 MLS (10 UNITS TOTAL) INTO THE SKIN AT BEDTIME. 3 mL 3  . quinapril  (ACCUPRIL) 40 MG tablet TAKE 1 TABLET EVERY DAY FOR BLOOD PRESSURE 30 tablet 0  . sildenafil (VIAGRA) 100 MG tablet Take 1 tablet (100 mg total) by mouth daily as needed for erectile dysfunction. 10 tablet 5  . Vitamin D, Ergocalciferol, (DRISDOL) 50000 units CAPS capsule Take 1 capsule by mouth 3 (three) times a week.  0   No current facility-administered medications for this visit.     No Known Allergies  Social History   Social History  . Marital status: Married    Spouse name: N/A  . Number of children: 3  . Years of education: N/A   Occupational History  . Trash collection Unemployed   Social History Main Topics  . Smoking status: Never Smoker  . Smokeless tobacco: Never Used  . Alcohol use No  . Drug use: No  . Sexual activity: Yes   Other Topics Concern  . Not on file   Social History Narrative  . No narrative on file    Family History  Problem Relation Age of Onset  . Diabetes Mother   . Heart attack Father     Review of Systems:  As stated in the HPI and otherwise negative.   BP 128/88 (BP Location: Left Arm, Patient Position:  Sitting, Cuff Size: Normal)   Pulse 88   Ht 5\' 10"  (1.778 m)   Wt 242 lb 12.8 oz (110.1 kg)   SpO2 97%   BMI 34.84 kg/m   Physical Examination: General: Well developed, well nourished, NAD  HEENT: OP clear, mucus membranes moist  SKIN: warm, dry. No rashes. Neuro: No focal deficits  Musculoskeletal: Muscle strength 5/5 all ext  Psychiatric: Mood and affect normal  Neck: No JVD, no carotid bruits, no thyromegaly, no lymphadenopathy.  Lungs:Clear bilaterally, no wheezes, rhonci, crackles Cardiovascular: Regular rate and rhythm. No murmurs, gallops or rubs. Abdomen:Soft. Bowel sounds present. Non-tender.  Extremities: No lower extremity edema. Pulses are 2 + in the bilateral DP/PT.  EKG:  EKG is ordered today. The ekg ordered today demonstrates NSR, rate 90 bpm. No ischemic changes  Recent Labs: No results found for  requested labs within last 8760 hours.   Lipid Panel   Wt Readings from Last 3 Encounters:  04/30/16 242 lb 12.8 oz (110.1 kg)  09/12/15 244 lb 4.8 oz (110.8 kg)  08/01/15 239 lb 14.4 oz (108.8 kg)     Other studies Reviewed: Additional studies/ records that were reviewed today include: . Review of the above records demonstrates:    Assessment and Plan:   1. CAD with unstable angina: He is having exertional chest pain with minimal exertion c/w class 3 unstable angina. Known CAD with prior stenting of the LAD and branch vessel disease. Will plan cardiac cath at [facility] next week. Pre-cath labs today. Risks and benefits of procedure reviewed. He agrees to proceed. Continue ASA, Plavix, statin, Ace-inh, beta blocker. I have offered cath tomorrow but he refuses since his wife is out of town. Will add SL NTG prn.   2. HTN: BP controlled  3. HLD: Followed in endocrine. He is on a statin.    Current medicines are reviewed at length with the patient today.  The patient does not have concerns regarding medicines.  The following changes have been made:  no change  Labs/ tests ordered today include:  No orders of the defined types were placed in this encounter.    Disposition:   FU with me after cath    Signed, Lauree Chandler, MD 04/30/2016 8:50 AM    [facility] [address], [place], [place]  [address] Phone: [phone]; Fax: [phone]

## 2016-05-08 ENCOUNTER — Encounter (HOSPITAL_COMMUNITY): Payer: Self-pay | Admitting: Student

## 2016-05-08 ENCOUNTER — Other Ambulatory Visit: Payer: Self-pay | Admitting: *Deleted

## 2016-05-08 DIAGNOSIS — T82855A Stenosis of coronary artery stent, initial encounter: Secondary | ICD-10-CM | POA: Diagnosis not present

## 2016-05-08 DIAGNOSIS — I200 Unstable angina: Secondary | ICD-10-CM | POA: Diagnosis not present

## 2016-05-08 DIAGNOSIS — I252 Old myocardial infarction: Secondary | ICD-10-CM | POA: Diagnosis not present

## 2016-05-08 DIAGNOSIS — E119 Type 2 diabetes mellitus without complications: Secondary | ICD-10-CM | POA: Diagnosis not present

## 2016-05-08 DIAGNOSIS — I25110 Atherosclerotic heart disease of native coronary artery with unstable angina pectoris: Secondary | ICD-10-CM | POA: Diagnosis not present

## 2016-05-08 LAB — GLUCOSE, CAPILLARY: GLUCOSE-CAPILLARY: 137 mg/dL — AB (ref 65–99)

## 2016-05-08 LAB — BASIC METABOLIC PANEL
Anion gap: 8 (ref 5–15)
BUN: 11 mg/dL (ref 6–20)
CALCIUM: 9.6 mg/dL (ref 8.9–10.3)
CO2: 24 mmol/L (ref 22–32)
CREATININE: 0.95 mg/dL (ref 0.61–1.24)
Chloride: 103 mmol/L (ref 101–111)
GFR calc Af Amer: >60 mL/min (ref >60)
GLUCOSE: 155 mg/dL — AB (ref 65–99)
Potassium: 3.8 mmol/L (ref 3.5–5.1)
Sodium: 135 mmol/L (ref 135–145)

## 2016-05-08 LAB — CBC
HCT: 47.1 % (ref 39.0–52.0)
Hemoglobin: 15.2 g/dL (ref 13.0–17.0)
MCH: 30.3 pg (ref 26.0–34.0)
MCHC: 32.3 g/dL (ref 30.0–36.0)
MCV: 93.8 fL (ref 78.0–100.0)
Platelets: 212 10*3/uL (ref 150–400)
RBC: 5.02 MIL/uL (ref 4.22–5.81)
RDW: 13.1 % (ref 11.5–15.5)
WBC: 10 10*3/uL (ref 4.0–10.5)

## 2016-05-08 MED ORDER — TICAGRELOR 90 MG PO TABS: 90.0000 mg | ORAL_TABLET | Freq: Two times a day (BID) | ORAL | Status: DC

## 2016-05-08 MED ORDER — AMBULATORY NON FORMULARY MEDICATION: 90.0000 mg | Freq: Two times a day (BID) | Status: DC

## 2016-05-08 MED ORDER — AMBULATORY NON FORMULARY MEDICATION: 81.0000 mg | Freq: Every day | Status: DC

## 2016-05-08 MED FILL — Nitroglycerin IV Soln 100 MCG/ML in D5W: INTRA_ARTERIAL | Qty: 10 | Status: AC

## 2016-05-08 NOTE — Progress Notes (Signed)
CARDIAC REHAB PHASE I   PRE:  Rate/Rhythm: 72 sR  BP:  Sitting: 146/91        SaO2: 98 RA  MODE:  Ambulation: 500 ft   POST:  Rate/Rhythm: 78 SR  BP:  Sitting: 191/107         SaO2: 100 RA  Pt ambulated 500 ft on RA, handheld assist, steady gait, tolerated well with no complaints. BP elevated. Completed PCI/stent education with pt and wife at bedside.  Reviewed risk factors, anti-platelet therapy, stent card, activity restrictions, ntg, exercise, heart healthy diet, carb counting, portion control, sodium restrictions and phase 2 cardiac rehab. Pt verbalized understanding, receptive to education. Pt agrees to phase 2 cardiac rehab referral, will send to [facility] per pt request. Pt to bed per pt request after walk, call bell within reach.   [id number] Lenna Sciara, RN, BSN 05/08/2016 11:10 AM

## 2016-05-08 NOTE — Care Management Note (Signed)
Case Management Note  Patient Details  Name: Philip Black MRN: [id number] Date of Birth: Sep 09, 1956  Subjective/Objective:   Patient is from home with spouse, pta indep.  He will be participating in [place] for [place].   He has a pcp and he has transportation at discharge.                    Action/Plan:   Expected Discharge Date:                  Expected Discharge Plan:  Home/Self Care  In-House Referral:     Discharge planning Services  CM Consult  Post Acute Care Choice:    Choice offered to:     DME Arranged:    DME Agency:     HH Arranged:    HH Agency:     Status of Service:  Completed, signed off  If discussed at [employer] of Stay Meetings, dates discussed:    Additional Comments:  Zenon Mayo, RN 05/08/2016, 9:12 AM

## 2016-05-08 NOTE — Progress Notes (Signed)
Hospital Problem List     Active Problems:   Coronary artery disease involving native coronary artery of native heart with unstable angina pectoris ([place])   Unstable angina [facility])    Patient Profile:   Primary Cardiologist: Dr. Angelena Form  60 yo male w/ PMH of CAD (DES to LAD in 2009), HTN, HLD, Type 2 DM, and OSA recently seen in the office for symptoms consistent with unstable angina. Presented for cardiac cath on 05/07/2016.  Subjective   Denies any chest discomfort or palpitations. To ambulate with cardiac rehab this AM.    Inpatient Medications    . amLODipine  5 mg Oral Daily  . aspirin EC  81 mg Oral Daily  . colesevelam  1,875 mg Oral BID WC  . ezetimibe  10 mg Oral Daily  . insulin detemir  10 Units Subcutaneous Q2200  . quinapril  40 mg Oral Daily  . rosuvastatin  20 mg Oral Daily  . sodium chloride flush  3 mL Intravenous Q12H  . ticagrelor  90 mg Oral BID    Vital Signs    Vitals:   05/07/16 1230 05/07/16 1300 05/07/16 2101 05/08/16 0500  BP: 137/78 (!) 142/80 137/90 (!) 151/88  Pulse:   63 73  Resp: 18 17 14 13   Temp:   97.9 F (36.6 C) 98.5 F (36.9 C)  TempSrc:   Oral Oral  SpO2:   97% 97%  Weight:    242 lb 8.1 oz (110 kg)  Height:        Intake/Output Summary (Last 24 hours) at 05/08/16 0834 Last data filed at 05/08/16 0402  Gross per 24 hour  Intake              720 ml  Output             3100 ml  Net            -2380 ml   Filed Weights   05/07/16 0631 05/08/16 0500  Weight: 242 lb (109.8 kg) 242 lb 8.1 oz (110 kg)    Physical Exam    General: Well developed, well nourished, male appearing in no acute distress. Head: Normocephalic, atraumatic.  Neck: Supple without bruits, JVD not elevated. Lungs:  Resp regular and unlabored, CTA without wheezing or rales. Heart: RRR, S1, S2, no S3, S4, or murmur; no rub. Abdomen: Soft, non-tender, non-distended with normoactive bowel sounds. No hepatomegaly. No rebound/guarding. No obvious abdominal  masses. Extremities: No clubbing, cyanosis, or edema. Distal pedal pulses are 2+ bilaterally. Neuro: Alert and oriented X 3. Moves all extremities spontaneously. Psych: Normal affect.  Labs    CBC  Recent Labs  05/08/16 0604  WBC 10.0  HGB 15.2  HCT 47.1  MCV 93.8  PLT 99991111   Basic Metabolic Panel  Recent Labs  05/08/16 0604  NA 135  K 3.8  CL 103  CO2 24  GLUCOSE 155*  BUN 11  CREATININE 0.95  CALCIUM 9.6    Telemetry    Sinus bradycardia, HR in mid-50's - 60's.   ECG    NSR, HR 78. Up-sloping ST in II, III, AVF.   Cardiac Studies and Radiology    Cardiac Catheterization: 05/07/2016    Mid RCA lesion, 99 %stenosed.  Prox RCA lesion, 90 %stenosed.  Ost 2nd Mrg to 2nd Mrg lesion, 80 %stenosed.  4th Mrg lesion, 50 %stenosed.  Ost 1st Mrg lesion, 50 %stenosed.  1st Mrg lesion, 50 %stenosed.  Ost LAD to Prox LAD  lesion, 10 %stenosed.  A STENT PROMUS PREM MR 3.0X12 drug eluting stent was successfully placed.  Ost Cx to Prox Cx lesion, 99 %stenosed.  Post intervention, there is a 0% residual stenosis.  Prox LAD lesion, 30 %stenosed.  The left ventricular systolic function is normal.  LV end diastolic pressure is normal.  The left ventricular ejection fraction is 50-55% by visual estimate.  There is no mitral valve regurgitation.   1. Triple vessel CAD 2. Patent stent proximal LAD with minimal restenosis 3. Severe stenosis ostial/proximal Circumflex 4. Successful PTCA/DES x 1 proximal Circumflex 5. Severe stenosis small caliber non-dominant RCA 6. Low normal LV systolic function  Recommendations: Will start Brilinta and ASA and Brilinta for now. He will be enrolled in the TWILIGHT study. Continue statin.   Assessment & Plan    1. Unstable Angina/ CAD - seen in the office by Dr. Angelena Form for symptoms concerning for unstable angina. Triple vessel CAD noted with patent stent to proximal LAD, severe 99% stenosis of Prox Cx, and severe 99%  stenosis of non-dominant RCA. - Started on ASA and Brilinta. Will be enrolled in the Twilight study. Continue Amlodipine, statin, and Zetia. No BB secondary to bradycardia.  2. HTN - BP stable overnight. Continue Amlodipine and Accupril.   3. HLD - continue Crestor and Zetia.  Will arrange for Cardiology follow-up.   Signed, Erma Heritage , PA-C 8:34 AM 05/08/2016 Pager: [phone]

## 2016-05-08 NOTE — Discharge Summary (Signed)
Discharge Summary    Patient ID: Philip Black,  MRN: [id number], DOB/AGE: 1956-06-10 60 y.o.  Admit date: 05/07/2016 Discharge date: 05/08/2016  Primary Care Provider: Cammy Copa Primary Cardiologist: Dr. Angelena Form  Discharge Diagnoses    Active Problems:   Coronary artery disease involving native coronary artery of native heart with unstable angina pectoris ([place])   Unstable angina [facility])   History of Present Illness     Philip Black is a 60 y.o. male with past medical history of CAD (DES to LAD in 2009), HTN, HLD, Type 2 DM, and OSA who was recently seen in the office for symptoms consistent with unstable angina.   Reported having episodes of chest pressure for the past month, present with exertion and resolving with rest. A cardiac catheterization was recommended. The risks and benefits were reviewed with the patient and he agreed to proceed. Presented to [facility] on 05/07/2016 for the procedure.  Hospital Course     Consultants: None   His catheterization showed triple vessel CAD with a patent stent to proximal LAD, severe 99% stenosis of Prox Cx, and severe 99% stenosis of a small caliber non-dominant RCA. A DES was placed to the Prox Cx with 0% residual stenosis. He tolerated the procedure well and no complications were noted. He was started on DAPT with ASA and Brilinta and enrolled in the [place].   The following morning, he denied any repeat episodes of chest discomfort or dyspnea with exertion. Cardiac cath site was stable with no evidence of a hematoma. HR was in the mid-50's on telemetry, therefore he will not be started on a BB at this time. Labs were reviewed with Hgb and creatinine remaining stable post-procedure. He ambulated over 500 ft with cardiac rehab without any anginal symptoms.  He was last examined by Dr. Radford Pax and deemed stable for discharge. Two-week Cardiology follow-up was previously arranged. He was discharged home in stable  condition. _____________  Discharge Vitals Blood pressure (!) 147/92, pulse 76, temperature 98.7 F (37.1 C), temperature source Oral, resp. rate 17, height 5\' 10"  (1.778 m), weight 242 lb 8.1 oz (110 kg), SpO2 98 %.  Filed Weights   05/07/16 0631 05/08/16 0500  Weight: 242 lb (109.8 kg) 242 lb 8.1 oz (110 kg)    Labs & Radiologic Studies     CBC  Recent Labs  05/08/16 0604  WBC 10.0  HGB 15.2  HCT 47.1  MCV 93.8  PLT 99991111   Basic Metabolic Panel  Recent Labs  05/08/16 0604  NA 135  K 3.8  CL 103  CO2 24  GLUCOSE 155*  BUN 11  CREATININE 0.95  CALCIUM 9.6   Liver Function Tests No results for input(s): AST, ALT, ALKPHOS, BILITOT, PROT, ALBUMIN in the last 72 hours. No results for input(s): LIPASE, AMYLASE in the last 72 hours. Cardiac Enzymes No results for input(s): CKTOTAL, CKMB, CKMBINDEX, TROPONINI in the last 72 hours. BNP Invalid input(s): POCBNP D-Dimer No results for input(s): DDIMER in the last 72 hours. Hemoglobin A1C No results for input(s): HGBA1C in the last 72 hours. Fasting Lipid Panel No results for input(s): CHOL, HDL, LDLCALC, TRIG, CHOLHDL, LDLDIRECT in the last 72 hours. Thyroid Function Tests No results for input(s): TSH, T4TOTAL, T3FREE, THYROIDAB in the last 72 hours.  Invalid input(s): FREET3  No results found.   Diagnostic Studies/Procedures    Cardiac Catheterization: 05/07/2016    Mid RCA lesion, 99 %stenosed.  Prox RCA lesion, 90 %stenosed.  Ost 2nd Mrg to 2nd Mrg lesion, 80 %stenosed.  4th Mrg lesion, 50 %stenosed.  Ost 1st Mrg lesion, 50 %stenosed.  1st Mrg lesion, 50 %stenosed.  Ost LAD to Prox LAD lesion, 10 %stenosed.  A STENT PROMUS PREM MR 3.0X12 drug eluting stent was successfully placed.  Ost Cx to Prox Cx lesion, 99 %stenosed.  Post intervention, there is a 0% residual stenosis.  Prox LAD lesion, 30 %stenosed.  The left ventricular systolic function is normal.  LV end diastolic pressure is  normal.  The left ventricular ejection fraction is 50-55% by visual estimate.  There is no mitral valve regurgitation.  1. Triple vessel CAD 2. Patent stent proximal LAD with minimal restenosis 3. Severe stenosis ostial/proximal Circumflex 4. Successful PTCA/DES x 1 proximal Circumflex 5. Severe stenosis small caliber non-dominant RCA 6. Low normal LV systolic function  Recommendations: Will start Brilinta and ASA and Brilinta for now. He will be enrolled in the TWILIGHT study. Continue statin.   Disposition   Pt is being discharged home today in good condition.  Follow-up Plans & Appointments    Follow-up Information    Lyda Jester, PA-C Follow up on 05/28/2016.   Specialties:  Cardiology, Radiology Why:  Cardiology Follow-Up on 05/28/2016 at 8:00AM. Contact information: [place] [address] [phone]          Discharge Instructions    Amb Referral to Cardiac Rehabilitation    Complete by:  As directed   Diagnosis:  Coronary Stents   Diet - low sodium heart healthy    Complete by:  As directed   Discharge instructions    Complete by:  As directed   PLEASE REMEMBER TO BRING ALL OF [place].  PLEASE ATTEND ALL SCHEDULED FOLLOW-UP APPOINTMENTS.   Activity: Increase activity slowly as tolerated. You may shower, but no soaking baths (or swimming) for 1 week. No driving for 24 hours. No lifting over 5 lbs for 1 week. No sexual activity for 1 week.   You May Return to Work: in 1 week (if applicable)  Wound Care: You may wash cath site gently with soap and water. Keep cath site clean and dry. If you notice pain, swelling, bleeding or pus at your cath site, please call [phone].   Increase activity slowly    Complete by:  As directed      Discharge Medications     Medication List    STOP taking these medications   clopidogrel 75 MG tablet Commonly known as:  PLAVIX     TAKE  these medications   amLODipine 5 MG tablet Commonly known as:  NORVASC Take 1 tablet (5 mg total) by mouth daily. Notes to patient:  Decreases blood pressure and chest pain    colesevelam 625 MG tablet Commonly known as:  WELCHOL Take 3 tablets (1,875 mg total) by mouth 2 (two) times daily with a meal. Notes to patient:  Cholesterol    CRESTOR 40 MG tablet Generic drug:  rosuvastatin TAKE 1/2 TABLET DAILY Notes to patient:  Cholesterol    ezetimibe 10 MG tablet Commonly known as:  ZETIA Take 10 mg by mouth daily. Notes to patient:  Cholesterol    Insulin Pen Needle 32G X 4 MM Misc 1 each by Does not apply route daily.   INVOKAMET 50-1000 MG Tabs Generic drug:  Canagliflozin-Metformin HCl Take 2 tablets by mouth daily. Notes to patient:  Diabetes  RESTART ON Friday, tommorrow  8/4 EVENING DOSE   LEVEMIR FLEXTOUCH 100 UNIT/ML Pen Generic drug:  Insulin Detemir INJECT 0.1 MLS (10 UNITS TOTAL) INTO THE SKIN AT BEDTIME. Notes to patient:  Diabetes    nitroGLYCERIN 0.4 MG SL tablet Commonly known as:  NITROSTAT Place 1 tablet (0.4 mg total) under the tongue every 5 (five) minutes as needed for chest pain.   quinapril 40 MG tablet Commonly known as:  ACCUPRIL TAKE 1 TABLET EVERY DAY FOR BLOOD PRESSURE Notes to patient:  Decreases blood pressure   sildenafil 100 MG tablet Commonly known as:  VIAGRA Take 1 tablet (100 mg total) by mouth daily as needed for erectile dysfunction. Notes to patient:  DO NOT TAKE VIAGRA AND NITROGLYCERIN TOGETHER WILL RESULT IN SEVERE DROP IN BLOOD PRESSURE   Vitamin D (Ergocalciferol) 50000 units Caps capsule Commonly known as:  DRISDOL Take 1 capsule by mouth 3 (three) times a week. Notes to patient:  Supplement       Is on BRILINTA 90mg  BID. Provided by [facility].    Allergies No Known Allergies  Outstanding Labs/Studies   None  Duration of Discharge Encounter   Greater than 30 minutes including physician  time.  Signed, Erma Heritage, PA-C 05/08/2016, 1:37 PM

## 2016-05-08 NOTE — Research (Signed)
Brillenta 90mg  twice daily and Asa 81mg  daily discussed with patient and his wife. Patient verbalized understanding of dosage. Patient verbalized he will seek medical attention for any bleeding.

## 2016-05-12 ENCOUNTER — Telehealth: Payer: Self-pay | Admitting: Cardiovascular Disease

## 2016-05-12 ENCOUNTER — Encounter: Payer: Self-pay | Admitting: Cardiovascular Disease

## 2016-05-12 NOTE — Telephone Encounter (Signed)
Pt notified and letter left at front desk for him to pick up.

## 2016-05-12 NOTE — Telephone Encounter (Signed)
Letter written. cdm 

## 2016-05-12 NOTE — Telephone Encounter (Signed)
New message      Pt states he has a stent in his arm.  He was supposed to return to work today.  He became nausea at work and returned home.  Pt needs a note to return to work next week.  Please call

## 2016-05-12 NOTE — Telephone Encounter (Signed)
Spoke with pt. He reports Dr. Angelena Form told him he could return to light duty work today. He had stent placed on 05/07/16.  Pt reports he started feeling nauseous on Saturday.  Thinks it may be related to Brilinta. Is trying to take this with food.  Nausea is off and on. Nausea was not symptom prior to stent placement. Symptom prior to stent placement was chest tightness with exertion.  He is not having any chest tightness. Is able to walk without any tightness.  Also complaining of some pain at cath site in wrist. Site is healing well.  He thinks he may have overdone this weekend. Pt returned to work today and felt nauseated and shaky.  His work requests he stay out of work this week.  Pt reports after returning home he is feeling better.  Is asking if Dr. Angelena Form would write letter keeping him out of work until 8/14.  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

## 2016-05-21 ENCOUNTER — Telehealth: Payer: Self-pay | Admitting: Cardiovascular Disease

## 2016-05-21 NOTE — Telephone Encounter (Signed)
New Message  DJ from [place] expressed she's calling to receive if the pt had surgery, type of procedure, and diagnosis.  Contacted Charmaine Hall from [facility] and she expressed due to hippa guidelines we cannot verbalized pt information without a form.  Informed DJ what Charmaine expressed, DJ verbalized a form was submitted to MD and needs call back for verification of form and to discuss initial reasons for calling.  DJ expressed Claim# [id number].  Please follow up with DJ. Thanks!

## 2016-05-21 NOTE — Telephone Encounter (Signed)
Will forward this information to Medical Records Representative, for further follow-up with DJ at [facility].

## 2016-05-21 NOTE — Telephone Encounter (Signed)
Called left VM with representative at [facility] to have Philip Black return my call-If she is requesting records she will need a  Signed ROI from the patient and our Copying service will complete this for her.

## 2016-05-28 ENCOUNTER — Encounter: Payer: Self-pay | Admitting: Cardiology

## 2016-05-28 ENCOUNTER — Ambulatory Visit (INDEPENDENT_AMBULATORY_CARE_PROVIDER_SITE_OTHER): Payer: Commercial Managed Care - HMO | Admitting: Cardiology

## 2016-05-28 DIAGNOSIS — I10 Essential (primary) hypertension: Secondary | ICD-10-CM | POA: Diagnosis not present

## 2016-05-28 DIAGNOSIS — I152 Hypertension secondary to endocrine disorders: Secondary | ICD-10-CM

## 2016-05-28 DIAGNOSIS — E1159 Type 2 diabetes mellitus with other circulatory complications: Secondary | ICD-10-CM | POA: Diagnosis not present

## 2016-05-28 MED ORDER — AMLODIPINE BESYLATE 5 MG PO TABS: 7.5000 mg | ORAL_TABLET | Freq: Every day | ORAL | 3 refills | Status: DC

## 2016-05-28 NOTE — Patient Instructions (Signed)
Medication Instructions: Your physician has recommended you make the following change in your medication:   1. INCREASE Amlodipine to 7.5 mg (1.5 tablets) by mouth once daily  Labwork: None Ordered  Procedures/Testing: None Ordered  Follow-Up: Your physician recommends that you schedule a follow-up appointment in 8 weeks with Dr. Angelena Form for CAD   Any Additional Special Instructions Will Be Listed Below (If Applicable).     If you need a refill on your cardiac medications before your next appointment, please call your pharmacy.

## 2016-05-28 NOTE — Progress Notes (Signed)
05/28/2016 Philip Black   Dec 27, 1955  [id number]  Primary Physician Cammy Copa, MD Primary Cardiologist: Dr. Angelena Form   Reason for Visit/CC: Post hospital follow-up for CAD  HPI:  Philip Black is a 60 y.o. male with past medical history of CAD (DES to LAD in 2009), HTN, HLD, Type 2 DM, and OSA who was recently seen in the office for symptoms consistent with unstable angina.   He reported having episodes of chest pressure for the past month, present with exertion and resolving with rest. A cardiac catheterization was recommended. He underwent left heart catheterization at [facility] on 05/07/2016. His catheterization showed triple vessel CAD with a patent stent to proximal LAD, severe 99% stenosis of Prox Cx, and severe 99% stenosis of a small caliber non-dominant RCA. Low normal LVEF of 50-55%. A DES was placed to the Prox Cx with 0% residual stenosis. He tolerated the procedure well and no complications were noted. He was started on DAPT with ASA and Brilinta and enrolled in the [place]. He was unable to tolerate initiation of a beta blocker due to resting bradycardia with a baseline heart rate in the mid 50s. He was continued on a statin and ACE inhibitor.  He presents to clinic today for post hospital follow-up. He reports that he has done well. He denies any recurrent CP. No significant dyspnea. Although he does have brief bouts of dyspnea, which sounds related to Brilinta.  He has been fully compliant with his meds, including DAPT. No abnormal bleeding.    Current Outpatient Prescriptions  Medication Sig Dispense Refill  . AMBULATORY NON FORMULARY MEDICATION Take 90 mg by mouth 2 (two) times daily. Medication Name: Brilinta 90 mg BID (TWILIGHT Research study provided)    . AMBULATORY NON FORMULARY MEDICATION Take 81 mg by mouth daily. Medication Name: ASA 81 mg Daily [facility] Research study provided)    . amLODipine (NORVASC) 5 MG tablet Take 1.5 tablets  (7.5 mg total) by mouth daily. 90 tablet 3  . colesevelam (WELCHOL) 625 MG tablet Take 3 tablets (1,875 mg total) by mouth 2 (two) times daily with a meal. 60 tablet 5  . ezetimibe (ZETIA) 10 MG tablet Take 10 mg by mouth daily.    . Insulin Pen Needle 32G X 4 MM MISC 1 each by Does not apply route daily. 100 each 6  . INVOKAMET [phone] MG TABS Take 2 tablets by mouth daily.     Marland Kitchen LEVEMIR FLEXTOUCH 100 UNIT/ML Pen INJECT 0.1 MLS (10 UNITS TOTAL) INTO THE SKIN AT BEDTIME. 3 mL 3  . nitroGLYCERIN (NITROSTAT) 0.4 MG SL tablet Place 0.4 mg under the tongue every 5 (five) minutes as needed for chest pain (X 3 DOSES MAX).    Marland Kitchen quinapril (ACCUPRIL) 40 MG tablet TAKE 1 TABLET EVERY DAY FOR BLOOD PRESSURE 30 tablet 0  . quinapril (ACCUPRIL) 40 MG tablet Take 40 mg by mouth at bedtime. BLOOD PRESSURE    . rosuvastatin (CRESTOR) 40 MG tablet Take 20 mg by mouth daily.    . sildenafil (VIAGRA) 100 MG tablet Take 1 tablet (100 mg total) by mouth daily as needed for erectile dysfunction. 10 tablet 5  . Vitamin D, Ergocalciferol, (DRISDOL) 50000 units CAPS capsule Take 1 capsule by mouth 3 (three) times a week.  0   No current facility-administered medications for this visit.     No Known Allergies  Social History   Social History  . Marital status: Married    Spouse  name: N/A  . Number of children: 3  . Years of education: N/A   Occupational History  . Trash collection Unemployed   Social History Main Topics  . Smoking status: Never Smoker  . Smokeless tobacco: Never Used  . Alcohol use No  . Drug use: No  . Sexual activity: Yes   Other Topics Concern  . Not on file   Social History Narrative  . No narrative on file     Review of Systems: General: negative for chills, fever, night sweats or weight changes.  Cardiovascular: negative for chest pain, dyspnea on exertion, edema, orthopnea, palpitations, paroxysmal nocturnal dyspnea or shortness of breath Dermatological: negative for  rash Respiratory: negative for cough or wheezing Urologic: negative for hematuria Abdominal: negative for nausea, vomiting, diarrhea, bright red blood per rectum, melena, or hematemesis Neurologic: negative for visual changes, syncope, or dizziness All other systems reviewed and are otherwise negative except as noted above.    Blood pressure 140/70, pulse 88, height 5\' 10"  (1.778 m), weight 238 lb 12.8 oz (108.3 kg).  General appearance: alert, cooperative and no distress Neck: no carotid bruit and no JVD Lungs: clear to auscultation bilaterally Heart: regular rate and rhythm, S1, S2 normal, no murmur, click, rub or gallop Extremities: no LEE Pulses: 2+ and symmetric Skin: warm and dry Neurologic: Grossly normal  EKG not performed  ASSESSMENT AND PLAN:   1. CAD: Recent catheterization showed triple vessel CAD with a patent stent to proximal LAD, severe 99% stenosis of Prox Cx, and severe 99% stenosis of a small caliber non-dominant RCA. A DES was placed to the Prox Cx with 0% residual stenosis. Low normal LVEF of 50-55%. He has been stable post discharge w/o any recurrent anginal symptomatology. Continue DAPT with ASA + Brilinta per Twilight Study protocol. Unable to tolerate BB given h/o bradycardia. Continue Crestor, quinapril and amlodipine. He has had mild bouts of dyspnea since starting Brilinta. Patient reassured that this is common and should improve over the next several weeks. He was advised to take with cup of coffee in the mornings.   2. HTN:  Mildly elevated at 140/70. He is a diabetic, has CAD, HLD and family h/o CAD and is AA. Goal BP given DM is <130/80. We will increase his amlodipine from 5 mg to 7.5 mg daily.  3. HLD: on Crestor. Lipids followed by PCP. We will attempt to obtain records so that we can assesses his baseline cholesterol levels.   4. OSA: non compliant with CPAP. Improved with weight loss. Patient states he lost ~80 lbs over the last several years.   5.  DM: on Insulin and Invokamet. Followed by PCP.    PLAN  F/u with Dr. Angelena Form in 6-8 weeks.   Gurman Ashland PA-C 05/28/2016 9:00 AM

## 2016-06-05 ENCOUNTER — Telehealth: Payer: Self-pay | Admitting: *Deleted

## 2016-06-05 NOTE — Telephone Encounter (Signed)
TWILIGHT Research study month 1 telephone follow up attempted. Left message for patient to call research office and schedule appointment to receive more free Brilinta.

## 2016-06-11 ENCOUNTER — Encounter: Payer: Self-pay | Admitting: *Deleted

## 2016-06-11 NOTE — Progress Notes (Signed)
TWILIGHT Research study month 1 telephone follow up visit completed. Patient states he has been compliant with medication. He has been experiencing some shortness of breath 1-2 times a day. He spoke with [place] PA at last visit about issue. He informs me that the [facility] gets better with caffeine. Instructed patient that we will monitor his symptoms and if he continues to have the [facility] to discuss with Dr. Angelena Form at 10/26 appointment. If he desires to continue in the study we will randomize him on 10/26 after his appointment with cardiology. Research appointment made for 10/26 @ 10:15. Questions encouraged and answered.

## 2016-07-04 ENCOUNTER — Ambulatory Visit: Payer: Commercial Managed Care - HMO | Admitting: Cardiovascular Disease

## 2016-07-11 ENCOUNTER — Telehealth: Payer: Self-pay | Admitting: Cardiovascular Disease

## 2016-07-11 NOTE — Telephone Encounter (Signed)
Can we please let him know that his cardiac meds are not likely making him "aggressive". He may need to see primary care to discuss his anxiety. Thanks, chris

## 2016-07-11 NOTE — Telephone Encounter (Signed)
Attempted to call patient, but memory was full and unable to leave message.  Will try again later.

## 2016-07-11 NOTE — Telephone Encounter (Signed)
Received walk-in form stating: "Needs letter for job stating that new med after procedure was causing high anxiety and aggressiveness. Please call when ready. Will pick up."  Called patient for clarification. Patient states a couple weeks ago, he started feeling anxious, antsy and jittery at work. He eventually "blew up" (he did not expound on what happened).  He also states he feels like he gets SOB a little quicker than he used to when he exerts himself. He states his union rep suggested a letter from Dr. Angelena Form for his job explaining that the medications he was placed on after his stent caused him to be aggressive. He is in the TWILIGHT study.  Informed the patient that it is unlikely those medications caused aggressive behavior, but Dr. Angelena Form would be consulted. He was grateful for assistance.

## 2016-07-11 NOTE — Telephone Encounter (Signed)
Walk IN Pt Form-Pt needs letter for job-please Call ASAP.

## 2016-07-16 NOTE — Telephone Encounter (Signed)
I spoke with pt and gave him information from Dr. Angelena Form. Pt reports he is following with primary care.  Pt reports he was under a lot of stress but he has resolved some issues and is feeling fine now.

## 2016-07-30 NOTE — Progress Notes (Signed)
Chief Complaint  Patient presents with  . Routine OV     History of Present Illness: 60 yo male with history of CAD, HTN, HLD, DM, OSA here today for cardiac follow up. In May 2009 he had an anterior MI treated with a drug-eluting stent to the LAD. He had residual 80% narrowing in the posterolateral branch and 90% narrowing of the marginal branch the circumflex artery which was managed medically. He has not tolerated beta blockers due to bradycardia. Unstable angina August 2017. Cardiac cath August 2017 with severe stenosis proximal Circumflex treated with a DES x 1. Severe stenosis small caliber non-dominant RCA treated medically. LV function normal by LV gram.   He is here today for follow up. No chest pain or dyspnea. He is feeling well.   Primary Care Physician: Cammy Copa, MD Endocrine: Buddy Duty    Past Medical History:  Diagnosis Date  . Arthritis    "shoulders, right hip" (05/07/2016)  . CAD S/P percutaneous coronary angioplasty    a. DES to LAD in 2009 b. cath: 05/2016 w/ patent stent to LAD, 99% stenosis Prox Cx (treated w/ DES), and 99% stenosis of small caliber non-dominant RCA (no intervention performed).  . High cholesterol   . Hypertension   . Metabolic syndrome   . OSA (obstructive sleep apnea)    "used mask; lost weight; stopped using mask" (05/07/2016)  . Type II diabetes mellitus ([place])     Past Surgical History:  Procedure Laterality Date  . CARDIAC CATHETERIZATION N/A 05/07/2016   Procedure: Left Heart Cath and Coronary Angiography;  Surgeon: Burnell Blanks, MD;  Location: [facility];  Service: Cardiovascular;  Laterality: N/A;  . CARDIAC CATHETERIZATION N/A 05/07/2016   Procedure: Coronary Stent Intervention;  Surgeon: Burnell Blanks, MD;  Location: [facility];  Service: Cardiovascular;  Laterality: N/A;  . CORONARY ANGIOPLASTY WITH STENT PLACEMENT  02/2008; 05/07/2016  . TONSILLECTOMY  ~ 1969    Current Outpatient Prescriptions   Medication Sig Dispense Refill  . AMBULATORY NON FORMULARY MEDICATION Take 90 mg by mouth 2 (two) times daily. Medication Name: Brilinta 90 mg BID (TWILIGHT Research study provided)    . amLODipine (NORVASC) 5 MG tablet Take 1.5 tablets (7.5 mg total) by mouth daily. 90 tablet 3  . colesevelam (WELCHOL) 625 MG tablet Take 3 tablets (1,875 mg total) by mouth 2 (two) times daily with a meal. 60 tablet 5  . ezetimibe (ZETIA) 10 MG tablet Take 10 mg by mouth daily.    . Insulin Pen Needle 32G X 4 MM MISC 1 each by Does not apply route daily. 100 each 6  . INVOKAMET [id number] MG TABS Take 2 tablets by mouth daily.     Marland Kitchen LEVEMIR FLEXTOUCH 100 UNIT/ML Pen INJECT 0.1 MLS (10 UNITS TOTAL) INTO THE SKIN AT BEDTIME. 3 mL 3  . nitroGLYCERIN (NITROSTAT) 0.4 MG SL tablet Place 0.4 mg under the tongue every 5 (five) minutes as needed for chest pain (X 3 DOSES MAX).    Marland Kitchen quinapril (ACCUPRIL) 40 MG tablet TAKE 1 TABLET EVERY DAY FOR BLOOD PRESSURE 30 tablet 0  . quinapril (ACCUPRIL) 40 MG tablet Take 40 mg by mouth at bedtime. BLOOD PRESSURE    . rosuvastatin (CRESTOR) 40 MG tablet Take 20 mg by mouth daily.    . sildenafil (VIAGRA) 100 MG tablet Take 1 tablet (100 mg total) by mouth daily as needed for erectile dysfunction. 10 tablet 5  . Vitamin D, Ergocalciferol, (DRISDOL) 50000  units CAPS capsule Take 1 capsule by mouth 3 (three) times a week.  0  . AMBULATORY NON FORMULARY MEDICATION Take 81 mg by mouth daily. Medication Name: Aspirin 81 mg daily or PLACEBO (TWILIGHT Research study PROVIDED)     No current facility-administered medications for this visit.     No Known Allergies  Social History   Social History  . Marital status: Married    Spouse name: N/A  . Number of children: 3  . Years of education: N/A   Occupational History  . Trash collection Unemployed   Social History Main Topics  . Smoking status: Never Smoker  . Smokeless tobacco: Never Used  . Alcohol use No  . Drug use: No  .  Sexual activity: Yes   Other Topics Concern  . Not on file   Social History Narrative  . No narrative on file    Family History  Problem Relation Age of Onset  . Diabetes Mother   . Heart attack Father     Review of Systems:  As stated in the HPI and otherwise negative.   BP 130/80   Pulse 84   Ht 5' 9.5" (1.765 m)   Wt 240 lb 6.4 oz (109 kg)   SpO2 94%   BMI 34.99 kg/m   Physical Examination: General: Well developed, well nourished, NAD  HEENT: OP clear, mucus membranes moist  SKIN: warm, dry. No rashes. Neuro: No focal deficits  Musculoskeletal: Muscle strength 5/5 all ext  Psychiatric: Mood and affect normal  Neck: No JVD, no carotid bruits, no thyromegaly, no lymphadenopathy.  Lungs:Clear bilaterally, no wheezes, rhonci, crackles Cardiovascular: Regular rate and rhythm. No murmurs, gallops or rubs. Abdomen:Soft. Bowel sounds present. Non-tender.  Extremities: No lower extremity edema. Pulses are 2 + in the bilateral DP/PT.  EKG:  EKG is not ordered today. The ekg ordered today demonstrates  Recent Labs: 05/08/2016: BUN 11; Creatinine, Ser 0.95; Hemoglobin 15.2; Platelets 212; Potassium 3.8; Sodium 135   Lipid Panel Followed in endocrine clinic.    Wt Readings from Last 3 Encounters:  07/31/16 240 lb 6.4 oz (109 kg)  05/28/16 238 lb 12.8 oz (108.3 kg)  05/08/16 242 lb 8.1 oz (110 kg)     Other studies Reviewed: Additional studies/ records that were reviewed today include: . Review of the above records demonstrates:    Assessment and Plan:   1. CAD without angina: No chest pain since most recent PCI/DES placement in July 2017. Continue ASA, Brilinta, statin, Ace-inh, beta blocker.  He is in the TWILIGHT study.   2. HTN: BP controlled. No changes.   3. HLD: Followed in endocrine. He is on a statin.    Current medicines are reviewed at length with the patient today.  The patient does not have concerns regarding medicines.  The following changes have  been made:  no change  Labs/ tests ordered today include:  No orders of the defined types were placed in this encounter.    Disposition:   FU with me after cath    Signed, Lauree Chandler, MD 07/31/2016 6:00 PM    [address], [place], [place]  [address] Phone: [phone]; Fax: [phone]

## 2016-07-31 ENCOUNTER — Encounter: Payer: Self-pay | Admitting: Cardiovascular Disease

## 2016-07-31 ENCOUNTER — Encounter: Payer: Self-pay | Admitting: *Deleted

## 2016-07-31 ENCOUNTER — Other Ambulatory Visit: Payer: Self-pay | Admitting: *Deleted

## 2016-07-31 ENCOUNTER — Ambulatory Visit (INDEPENDENT_AMBULATORY_CARE_PROVIDER_SITE_OTHER): Payer: Commercial Managed Care - HMO | Admitting: Cardiovascular Disease

## 2016-07-31 VITALS — BP 130/80 | HR 84 | Ht 69.5 in | Wt 240.4 lb

## 2016-07-31 DIAGNOSIS — I10 Essential (primary) hypertension: Secondary | ICD-10-CM | POA: Diagnosis not present

## 2016-07-31 DIAGNOSIS — I2510 Atherosclerotic heart disease of native coronary artery without angina pectoris: Secondary | ICD-10-CM | POA: Diagnosis not present

## 2016-07-31 DIAGNOSIS — Z006 Encounter for examination for normal comparison and control in clinical research program: Secondary | ICD-10-CM

## 2016-07-31 DIAGNOSIS — E782 Mixed hyperlipidemia: Secondary | ICD-10-CM

## 2016-07-31 MED ORDER — AMBULATORY NON FORMULARY MEDICATION: 81.0000 mg | Freq: Every day | Status: DC

## 2016-07-31 NOTE — Patient Instructions (Signed)

## 2016-07-31 NOTE — Progress Notes (Signed)
TWILIGHT Research study month 3 randomization visit completed. Patient denies any bleeding or other adverse events. He stats he has been compliant with study provided medication (may have missed 1 dose). Today he is randomized to ASA 81 mg daily or PLACEBO for the TWILIGHT research protocol. He has been dispensed bottle #'s [id number]; [id number]. Questions have been encouraged and answered. Next research visit will be a month 4 phone call.

## 2016-08-11 DIAGNOSIS — M1611 Unilateral primary osteoarthritis, right hip: Secondary | ICD-10-CM | POA: Diagnosis not present

## 2016-08-11 DIAGNOSIS — M7989 Other specified soft tissue disorders: Secondary | ICD-10-CM | POA: Diagnosis not present

## 2016-09-01 ENCOUNTER — Encounter: Payer: Self-pay | Admitting: *Deleted

## 2016-09-01 DIAGNOSIS — Z006 Encounter for examination for normal comparison and control in clinical research program: Secondary | ICD-10-CM

## 2016-09-01 NOTE — Progress Notes (Signed)
TWILIGHT Research study month 4 telephone call completed. Patient denies ant bleeding vents or other adverse events. He states he has been compliant with medication. Next research study visit is due no later than 03/03/17 and the research office will call to schedule. Questions encouraged and answered.

## 2016-09-10 DIAGNOSIS — Z794 Long term (current) use of insulin: Secondary | ICD-10-CM | POA: Diagnosis not present

## 2016-09-10 DIAGNOSIS — H35033 Hypertensive retinopathy, bilateral: Secondary | ICD-10-CM | POA: Diagnosis not present

## 2016-09-10 DIAGNOSIS — E1165 Type 2 diabetes mellitus with hyperglycemia: Secondary | ICD-10-CM | POA: Diagnosis not present

## 2016-09-10 DIAGNOSIS — Z5181 Encounter for therapeutic drug level monitoring: Secondary | ICD-10-CM | POA: Diagnosis not present

## 2016-09-10 DIAGNOSIS — E113292 Type 2 diabetes mellitus with mild nonproliferative diabetic retinopathy without macular edema, left eye: Secondary | ICD-10-CM | POA: Diagnosis not present

## 2016-10-30 DIAGNOSIS — Z794 Long term (current) use of insulin: Secondary | ICD-10-CM | POA: Diagnosis not present

## 2016-10-30 DIAGNOSIS — E1165 Type 2 diabetes mellitus with hyperglycemia: Secondary | ICD-10-CM | POA: Diagnosis not present

## 2016-10-30 DIAGNOSIS — E113292 Type 2 diabetes mellitus with mild nonproliferative diabetic retinopathy without macular edema, left eye: Secondary | ICD-10-CM | POA: Diagnosis not present

## 2016-10-30 DIAGNOSIS — H35033 Hypertensive retinopathy, bilateral: Secondary | ICD-10-CM | POA: Diagnosis not present

## 2016-10-30 DIAGNOSIS — Z5181 Encounter for therapeutic drug level monitoring: Secondary | ICD-10-CM | POA: Diagnosis not present

## 2016-11-17 DIAGNOSIS — H18413 Arcus senilis, bilateral: Secondary | ICD-10-CM | POA: Diagnosis not present

## 2016-11-17 DIAGNOSIS — Z83511 Family history of glaucoma: Secondary | ICD-10-CM | POA: Diagnosis not present

## 2016-11-17 DIAGNOSIS — H40013 Open angle with borderline findings, low risk, bilateral: Secondary | ICD-10-CM | POA: Diagnosis not present

## 2016-11-20 DIAGNOSIS — M79672 Pain in left foot: Secondary | ICD-10-CM | POA: Diagnosis not present

## 2016-11-20 DIAGNOSIS — M79671 Pain in right foot: Secondary | ICD-10-CM | POA: Diagnosis not present

## 2016-11-20 DIAGNOSIS — E1351 Other specified diabetes mellitus with diabetic peripheral angiopathy without gangrene: Secondary | ICD-10-CM | POA: Diagnosis not present

## 2016-11-20 DIAGNOSIS — G629 Polyneuropathy, unspecified: Secondary | ICD-10-CM | POA: Diagnosis not present

## 2016-11-20 DIAGNOSIS — G603 Idiopathic progressive neuropathy: Secondary | ICD-10-CM | POA: Diagnosis not present

## 2016-12-03 DIAGNOSIS — M1611 Unilateral primary osteoarthritis, right hip: Secondary | ICD-10-CM | POA: Diagnosis not present

## 2016-12-03 MED FILL — HUMALOG MIX 50-50 KWIKPEN: (50-50) 100 | 30 days supply | Qty: 30 | Fill #0

## 2016-12-03 MED FILL — EZETIMIBE 10 MG TABLET: 10 | 30 days supply | Qty: 30 | Fill #0

## 2016-12-11 DIAGNOSIS — M1611 Unilateral primary osteoarthritis, right hip: Secondary | ICD-10-CM | POA: Diagnosis not present

## 2016-12-18 DIAGNOSIS — G603 Idiopathic progressive neuropathy: Secondary | ICD-10-CM | POA: Diagnosis not present

## 2016-12-18 DIAGNOSIS — M79671 Pain in right foot: Secondary | ICD-10-CM | POA: Diagnosis not present

## 2016-12-18 DIAGNOSIS — M79672 Pain in left foot: Secondary | ICD-10-CM | POA: Diagnosis not present

## 2016-12-18 DIAGNOSIS — E1351 Other specified diabetes mellitus with diabetic peripheral angiopathy without gangrene: Secondary | ICD-10-CM | POA: Diagnosis not present

## 2016-12-19 MED FILL — VIT D2 1.25 MG (50,000 UNIT: 1.25 MG | 14 days supply | Qty: 6 | Fill #0

## 2016-12-19 MED FILL — ROSUVASTATIN CALCIUM 40 MG: 40 | 90 days supply | Qty: 45 | Fill #0

## 2016-12-19 MED FILL — QUINAPRIL 40 MG TABLET: 40 | 90 days supply | Qty: 90 | Fill #0

## 2016-12-23 DIAGNOSIS — Z794 Long term (current) use of insulin: Secondary | ICD-10-CM | POA: Diagnosis not present

## 2016-12-23 DIAGNOSIS — Z7984 Long term (current) use of oral hypoglycemic drugs: Secondary | ICD-10-CM | POA: Diagnosis not present

## 2016-12-23 DIAGNOSIS — N529 Male erectile dysfunction, unspecified: Secondary | ICD-10-CM | POA: Diagnosis not present

## 2016-12-23 DIAGNOSIS — E119 Type 2 diabetes mellitus without complications: Secondary | ICD-10-CM | POA: Diagnosis not present

## 2016-12-23 DIAGNOSIS — M25512 Pain in left shoulder: Secondary | ICD-10-CM | POA: Diagnosis not present

## 2016-12-23 DIAGNOSIS — I2510 Atherosclerotic heart disease of native coronary artery without angina pectoris: Secondary | ICD-10-CM | POA: Diagnosis not present

## 2016-12-23 DIAGNOSIS — E785 Hyperlipidemia, unspecified: Secondary | ICD-10-CM | POA: Diagnosis not present

## 2016-12-23 DIAGNOSIS — I10 Essential (primary) hypertension: Secondary | ICD-10-CM | POA: Diagnosis not present

## 2016-12-23 MED FILL — SILDENAFIL 100 MG TABLET: 100 | 30 days supply | Qty: 6 | Fill #0

## 2016-12-25 DIAGNOSIS — M1611 Unilateral primary osteoarthritis, right hip: Secondary | ICD-10-CM | POA: Diagnosis not present

## 2016-12-26 DIAGNOSIS — E1165 Type 2 diabetes mellitus with hyperglycemia: Secondary | ICD-10-CM | POA: Diagnosis not present

## 2016-12-26 DIAGNOSIS — Z794 Long term (current) use of insulin: Secondary | ICD-10-CM | POA: Diagnosis not present

## 2016-12-26 DIAGNOSIS — Z5181 Encounter for therapeutic drug level monitoring: Secondary | ICD-10-CM | POA: Diagnosis not present

## 2016-12-26 DIAGNOSIS — H35033 Hypertensive retinopathy, bilateral: Secondary | ICD-10-CM | POA: Diagnosis not present

## 2016-12-26 DIAGNOSIS — E113292 Type 2 diabetes mellitus with mild nonproliferative diabetic retinopathy without macular edema, left eye: Secondary | ICD-10-CM | POA: Diagnosis not present

## 2016-12-29 MED FILL — EZETIMIBE 10 MG TABLET: 10 | 30 days supply | Qty: 30 | Fill #0

## 2017-01-01 MED FILL — HUMALOG MIX 50-50 KWIKPEN: (50-50) 100 | 30 days supply | Qty: 30 | Fill #1

## 2017-01-26 MED FILL — AMLODIPINE BESYLATE 5 MG TA: 5 | 90 days supply | Qty: 135 | Fill #0

## 2017-01-26 MED FILL — WELCHOL 625 MG TABLET: 625 | 90 days supply | Qty: 180 | Fill #0

## 2017-01-26 MED FILL — VIT D2 1.25 MG (50,000 UNIT: 1.25 MG | 14 days supply | Qty: 6 | Fill #0

## 2017-01-26 MED FILL — EZETIMIBE 10 MG TABLET: 10 | 30 days supply | Qty: 30 | Fill #1

## 2017-01-29 DIAGNOSIS — B351 Tinea unguium: Secondary | ICD-10-CM | POA: Diagnosis not present

## 2017-01-29 DIAGNOSIS — G603 Idiopathic progressive neuropathy: Secondary | ICD-10-CM | POA: Diagnosis not present

## 2017-01-29 DIAGNOSIS — E1351 Other specified diabetes mellitus with diabetic peripheral angiopathy without gangrene: Secondary | ICD-10-CM | POA: Diagnosis not present

## 2017-02-03 MED FILL — UNIFINE PENTIPS 32GX5/32: 32G X 4 MM | 50 days supply | Qty: 100 | Fill #0

## 2017-02-03 MED FILL — UNIFINE PENTIPS 32GX5/32": 32G X 4 MM | 50 days supply | Qty: 100 | Fill #0

## 2017-02-04 MED FILL — HUMALOG MIX 50-50 KWIKPEN: (50-50) 100 | 30 days supply | Qty: 30 | Fill #2

## 2017-02-06 DIAGNOSIS — Z794 Long term (current) use of insulin: Secondary | ICD-10-CM | POA: Diagnosis not present

## 2017-02-06 DIAGNOSIS — E113292 Type 2 diabetes mellitus with mild nonproliferative diabetic retinopathy without macular edema, left eye: Secondary | ICD-10-CM | POA: Diagnosis not present

## 2017-02-06 DIAGNOSIS — E1165 Type 2 diabetes mellitus with hyperglycemia: Secondary | ICD-10-CM | POA: Diagnosis not present

## 2017-02-06 DIAGNOSIS — H35033 Hypertensive retinopathy, bilateral: Secondary | ICD-10-CM | POA: Diagnosis not present

## 2017-02-06 DIAGNOSIS — Z5181 Encounter for therapeutic drug level monitoring: Secondary | ICD-10-CM | POA: Diagnosis not present

## 2017-02-16 ENCOUNTER — Encounter: Payer: Self-pay | Admitting: *Deleted

## 2017-02-16 DIAGNOSIS — Z006 Encounter for examination for normal comparison and control in clinical research program: Secondary | ICD-10-CM

## 2017-02-16 NOTE — Progress Notes (Signed)
TWILIGHT Research study month 9 follow up visit completed. Patient denies any bleeding or other adverse events. He has been 97 % compliant with ASA/Placebo and 88.5 % compliant with Brilinta. I encouraged patient to set an alarm on his phone as a reminder or to download a medication reminder app. His next research required visit is due no later than 25/NOV/2018. At his next visit he will no longer receive ASA/Placebo or Brilinta. Further antiplatelet therapy will be at the discretion of his cardiologist. Questions were encouraged and answered.

## 2017-02-24 MED FILL — VIT D2 1.25 MG (50,000 UNIT: 1.25 MG | 14 days supply | Qty: 6 | Fill #1

## 2017-02-24 MED FILL — SILDENAFIL 100 MG TABLET: 100 | 30 days supply | Qty: 6 | Fill #0

## 2017-02-24 MED FILL — EZETIMIBE 10 MG TABLET: 10 | 30 days supply | Qty: 30 | Fill #0

## 2017-02-27 DIAGNOSIS — E785 Hyperlipidemia, unspecified: Secondary | ICD-10-CM | POA: Diagnosis not present

## 2017-02-27 DIAGNOSIS — M25512 Pain in left shoulder: Secondary | ICD-10-CM | POA: Diagnosis not present

## 2017-02-27 DIAGNOSIS — I2510 Atherosclerotic heart disease of native coronary artery without angina pectoris: Secondary | ICD-10-CM | POA: Diagnosis not present

## 2017-02-27 DIAGNOSIS — I10 Essential (primary) hypertension: Secondary | ICD-10-CM | POA: Diagnosis not present

## 2017-02-27 DIAGNOSIS — E119 Type 2 diabetes mellitus without complications: Secondary | ICD-10-CM | POA: Diagnosis not present

## 2017-03-03 MED FILL — ROSUVASTATIN CALCIUM 40 MG: 40 | 90 days supply | Qty: 45 | Fill #0

## 2017-03-06 DIAGNOSIS — M67912 Unspecified disorder of synovium and tendon, left shoulder: Secondary | ICD-10-CM | POA: Diagnosis not present

## 2017-03-19 DIAGNOSIS — M25512 Pain in left shoulder: Secondary | ICD-10-CM | POA: Diagnosis not present

## 2017-03-26 DIAGNOSIS — M1611 Unilateral primary osteoarthritis, right hip: Secondary | ICD-10-CM | POA: Diagnosis not present

## 2017-03-27 DIAGNOSIS — M67912 Unspecified disorder of synovium and tendon, left shoulder: Secondary | ICD-10-CM | POA: Diagnosis not present

## 2017-03-27 DIAGNOSIS — M24812 Other specific joint derangements of left shoulder, not elsewhere classified: Secondary | ICD-10-CM | POA: Diagnosis not present

## 2017-03-27 MED FILL — HUMALOG MIX 50-50 KWIKPEN: (50-50) 100 | 30 days supply | Qty: 30 | Fill #3

## 2017-04-02 DIAGNOSIS — M25512 Pain in left shoulder: Secondary | ICD-10-CM | POA: Diagnosis not present

## 2017-04-02 DIAGNOSIS — M7542 Impingement syndrome of left shoulder: Secondary | ICD-10-CM | POA: Diagnosis not present

## 2017-04-03 MED FILL — EZETIMIBE 10 MG TABLET: 10 | 30 days supply | Qty: 30 | Fill #1

## 2017-04-03 MED FILL — QUINAPRIL 40 MG TABLET: 40 | 90 days supply | Qty: 90 | Fill #1

## 2017-04-09 DIAGNOSIS — M25512 Pain in left shoulder: Secondary | ICD-10-CM | POA: Diagnosis not present

## 2017-04-09 DIAGNOSIS — M7542 Impingement syndrome of left shoulder: Secondary | ICD-10-CM | POA: Diagnosis not present

## 2017-04-09 MED FILL — UNIFINE PENTIPS 32GX5/32: 32G X 4 MM | 50 days supply | Qty: 100 | Fill #0

## 2017-04-09 MED FILL — UNIFINE PENTIPS 32GX5/32": 32G X 4 MM | 50 days supply | Qty: 100 | Fill #0

## 2017-04-14 DIAGNOSIS — M7542 Impingement syndrome of left shoulder: Secondary | ICD-10-CM | POA: Diagnosis not present

## 2017-04-14 DIAGNOSIS — M25512 Pain in left shoulder: Secondary | ICD-10-CM | POA: Diagnosis not present

## 2017-04-16 DIAGNOSIS — M25512 Pain in left shoulder: Secondary | ICD-10-CM | POA: Diagnosis not present

## 2017-04-16 DIAGNOSIS — H40023 Open angle with borderline findings, high risk, bilateral: Secondary | ICD-10-CM | POA: Diagnosis not present

## 2017-04-16 DIAGNOSIS — M7542 Impingement syndrome of left shoulder: Secondary | ICD-10-CM | POA: Diagnosis not present

## 2017-04-16 DIAGNOSIS — H2513 Age-related nuclear cataract, bilateral: Secondary | ICD-10-CM | POA: Diagnosis not present

## 2017-04-16 DIAGNOSIS — Z83511 Family history of glaucoma: Secondary | ICD-10-CM | POA: Diagnosis not present

## 2017-04-16 DIAGNOSIS — E113292 Type 2 diabetes mellitus with mild nonproliferative diabetic retinopathy without macular edema, left eye: Secondary | ICD-10-CM | POA: Diagnosis not present

## 2017-04-16 DIAGNOSIS — H40053 Ocular hypertension, bilateral: Secondary | ICD-10-CM | POA: Diagnosis not present

## 2017-04-21 DIAGNOSIS — M25512 Pain in left shoulder: Secondary | ICD-10-CM | POA: Diagnosis not present

## 2017-04-21 DIAGNOSIS — M7542 Impingement syndrome of left shoulder: Secondary | ICD-10-CM | POA: Diagnosis not present

## 2017-04-21 MED FILL — VIT D2 1.25 MG (50,000 UNIT: 1.25 MG | 14 days supply | Qty: 6 | Fill #0

## 2017-04-21 MED FILL — WELCHOL 625 MG TABLET: 625 | 90 days supply | Qty: 180 | Fill #1

## 2017-04-23 DIAGNOSIS — M25512 Pain in left shoulder: Secondary | ICD-10-CM | POA: Diagnosis not present

## 2017-04-23 DIAGNOSIS — M7542 Impingement syndrome of left shoulder: Secondary | ICD-10-CM | POA: Diagnosis not present

## 2017-04-23 MED FILL — SILDENAFIL 100 MG TABLET: 100 | 30 days supply | Qty: 6 | Fill #0

## 2017-04-28 DIAGNOSIS — M25512 Pain in left shoulder: Secondary | ICD-10-CM | POA: Diagnosis not present

## 2017-04-28 DIAGNOSIS — M7542 Impingement syndrome of left shoulder: Secondary | ICD-10-CM | POA: Diagnosis not present

## 2017-04-30 DIAGNOSIS — E1351 Other specified diabetes mellitus with diabetic peripheral angiopathy without gangrene: Secondary | ICD-10-CM | POA: Diagnosis not present

## 2017-04-30 DIAGNOSIS — B351 Tinea unguium: Secondary | ICD-10-CM | POA: Diagnosis not present

## 2017-04-30 DIAGNOSIS — G603 Idiopathic progressive neuropathy: Secondary | ICD-10-CM | POA: Diagnosis not present

## 2017-05-01 DIAGNOSIS — M75112 Incomplete rotator cuff tear or rupture of left shoulder, not specified as traumatic: Secondary | ICD-10-CM | POA: Diagnosis not present

## 2017-05-11 MED FILL — HUMALOG MIX 50-50 KWIKPEN: (50-50) 100 | 30 days supply | Qty: 30 | Fill #4

## 2017-05-11 MED FILL — EZETIMIBE 10 MG TABLET: 10 | 30 days supply | Qty: 30 | Fill #2

## 2017-05-21 DIAGNOSIS — Z5181 Encounter for therapeutic drug level monitoring: Secondary | ICD-10-CM | POA: Diagnosis not present

## 2017-05-21 DIAGNOSIS — E1165 Type 2 diabetes mellitus with hyperglycemia: Secondary | ICD-10-CM | POA: Diagnosis not present

## 2017-05-21 DIAGNOSIS — Z794 Long term (current) use of insulin: Secondary | ICD-10-CM | POA: Diagnosis not present

## 2017-05-21 DIAGNOSIS — E113292 Type 2 diabetes mellitus with mild nonproliferative diabetic retinopathy without macular edema, left eye: Secondary | ICD-10-CM | POA: Diagnosis not present

## 2017-05-21 DIAGNOSIS — H35033 Hypertensive retinopathy, bilateral: Secondary | ICD-10-CM | POA: Diagnosis not present

## 2017-06-11 ENCOUNTER — Other Ambulatory Visit: Payer: Self-pay | Admitting: Cardiology

## 2017-06-11 DIAGNOSIS — I10 Essential (primary) hypertension: Principal | ICD-10-CM

## 2017-06-11 DIAGNOSIS — I152 Hypertension secondary to endocrine disorders: Secondary | ICD-10-CM

## 2017-06-11 DIAGNOSIS — E1159 Type 2 diabetes mellitus with other circulatory complications: Secondary | ICD-10-CM

## 2017-06-11 MED FILL — AMLODIPINE BESYLATE 5 MG TA: 5 | 90 days supply | Qty: 135 | Fill #0

## 2017-06-11 MED FILL — UNIFINE PENTIPS 32GX5/32: 32G X 4 MM | 50 days supply | Qty: 100 | Fill #0

## 2017-06-11 MED FILL — HUMALOG MIX 50-50 KWIKPEN: (50-50) 100 | 30 days supply | Qty: 30 | Fill #5

## 2017-06-11 MED FILL — UNIFINE PENTIPS 32GX5/32": 32G X 4 MM | 50 days supply | Qty: 100 | Fill #0

## 2017-06-11 MED FILL — EZETIMIBE 10 MG TABLET: 10 | 30 days supply | Qty: 30 | Fill #0

## 2017-06-11 MED FILL — ROSUVASTATIN CALCIUM 40 MG: 40 | 90 days supply | Qty: 45 | Fill #0

## 2017-06-11 NOTE — Telephone Encounter (Signed)
REFILL 

## 2017-06-22 ENCOUNTER — Encounter: Payer: Self-pay | Admitting: Cardiovascular Disease

## 2017-06-22 ENCOUNTER — Ambulatory Visit (INDEPENDENT_AMBULATORY_CARE_PROVIDER_SITE_OTHER): Payer: 59 | Admitting: Cardiovascular Disease

## 2017-06-22 VITALS — BP 138/82 | HR 81 | Ht 70.0 in | Wt 259.4 lb

## 2017-06-22 DIAGNOSIS — H35033 Hypertensive retinopathy, bilateral: Secondary | ICD-10-CM | POA: Diagnosis not present

## 2017-06-22 DIAGNOSIS — I2510 Atherosclerotic heart disease of native coronary artery without angina pectoris: Secondary | ICD-10-CM | POA: Diagnosis not present

## 2017-06-22 DIAGNOSIS — I10 Essential (primary) hypertension: Secondary | ICD-10-CM | POA: Diagnosis not present

## 2017-06-22 DIAGNOSIS — E1165 Type 2 diabetes mellitus with hyperglycemia: Secondary | ICD-10-CM | POA: Diagnosis not present

## 2017-06-22 DIAGNOSIS — Z794 Long term (current) use of insulin: Secondary | ICD-10-CM | POA: Diagnosis not present

## 2017-06-22 DIAGNOSIS — E113292 Type 2 diabetes mellitus with mild nonproliferative diabetic retinopathy without macular edema, left eye: Secondary | ICD-10-CM | POA: Diagnosis not present

## 2017-06-22 DIAGNOSIS — Z5181 Encounter for therapeutic drug level monitoring: Secondary | ICD-10-CM | POA: Diagnosis not present

## 2017-06-22 DIAGNOSIS — E782 Mixed hyperlipidemia: Secondary | ICD-10-CM

## 2017-06-22 MED ORDER — METOPROLOL TARTRATE 25 MG PO TABS
25.0000 mg | ORAL_TABLET | Freq: Two times a day (BID) | ORAL | 11 refills | Status: DC
Start: 2017-06-22 — End: 2018-07-30

## 2017-06-22 NOTE — Progress Notes (Signed)
Chief Complaint  Patient presents with  . Follow-up    CAD   History of Present Illness: 61 yo male with history of CAD, HTN, HLD, DM, OSA here today for cardiac follow up. In May 2009 he had an anterior MI treated with a drug-eluting stent in the LAD. I saw him in the office in July 2017 and he had symptoms c/w unstable angina. Cardiac cath August 2017 with severe stenosis proximal Circumflex treated with a DES x 1. Severe stenosis small caliber non-dominant RCA treated medically. LV function normal by LV gram in August 2017. He has not tolerated beta blockers due to bradycardia.  He is here today for follow up of his CAD. The patient denies any chest pain, dyspnea, palpitations, lower extremity edema, orthopnea, PND, dizziness, near syncope or syncope.  .   Primary Care Physician: Aura Dials, MD Endocrine: Buddy Duty    Past Medical History:  Diagnosis Date  . Arthritis    "shoulders, right hip" (05/07/2016)  . CAD S/P percutaneous coronary angioplasty    a. DES to LAD in 2009 b. cath: 05/2016 w/ patent stent to LAD, 99% stenosis Prox Cx (treated w/ DES), and 99% stenosis of small caliber non-dominant RCA (no intervention performed).  . High cholesterol   . Hypertension   . Metabolic syndrome   . OSA (obstructive sleep apnea)    "used mask; lost weight; stopped using mask" (05/07/2016)  . Type II diabetes mellitus ([place])     Past Surgical History:  Procedure Laterality Date  . CARDIAC CATHETERIZATION N/A 05/07/2016   Procedure: Left Heart Cath and Coronary Angiography;  Surgeon: Burnell Blanks, MD;  Location: [facility];  Service: Cardiovascular;  Laterality: N/A;  . CARDIAC CATHETERIZATION N/A 05/07/2016   Procedure: Coronary Stent Intervention;  Surgeon: Burnell Blanks, MD;  Location: [facility];  Service: Cardiovascular;  Laterality: N/A;  . CORONARY ANGIOPLASTY WITH STENT PLACEMENT  02/2008; 05/07/2016  . TONSILLECTOMY  ~ 1969    Current Outpatient  Prescriptions  Medication Sig Dispense Refill  . AMBULATORY NON FORMULARY MEDICATION Take 90 mg by mouth 2 (two) times daily. Medication Name: Brilinta 90 mg BID (TWILIGHT Research study provided)    . AMBULATORY NON FORMULARY MEDICATION Take 81 mg by mouth daily. Medication Name: Aspirin 81 mg daily or PLACEBO (TWILIGHT Research study PROVIDED)    . amLODipine (NORVASC) 5 MG tablet Take 1.5 tablets (7.5 mg total) by mouth daily. KEEP OV. 135 tablet 0  . colesevelam (WELCHOL) 625 MG tablet Take 3 tablets (1,875 mg total) by mouth 2 (two) times daily with a meal. 60 tablet 5  . ezetimibe (ZETIA) 10 MG tablet Take 10 mg by mouth daily.    . Insulin Pen Needle 32G X 4 MM MISC 1 each by Does not apply route daily. 100 each 6  . INVOKAMET [id number] MG TABS Take 2 tablets by mouth daily.     Marland Kitchen LEVEMIR FLEXTOUCH 100 UNIT/ML Pen INJECT 0.1 MLS (10 UNITS TOTAL) INTO THE SKIN AT BEDTIME. 3 mL 3  . nitroGLYCERIN (NITROSTAT) 0.4 MG SL tablet Place 0.4 mg under the tongue every 5 (five) minutes as needed for chest pain (X 3 DOSES MAX).    Marland Kitchen quinapril (ACCUPRIL) 40 MG tablet TAKE 1 TABLET EVERY DAY FOR BLOOD PRESSURE 30 tablet 0  . quinapril (ACCUPRIL) 40 MG tablet Take 40 mg by mouth at bedtime. BLOOD PRESSURE    . rosuvastatin (CRESTOR) 40 MG tablet Take 20 mg by mouth  daily.    . sildenafil (VIAGRA) 100 MG tablet Take 1 tablet (100 mg total) by mouth daily as needed for erectile dysfunction. 10 tablet 5  . Vitamin D, Ergocalciferol, (DRISDOL) 50000 units CAPS capsule Take 1 capsule by mouth 3 (three) times a week.  0  . metoprolol tartrate (LOPRESSOR) 25 MG tablet Take 1 tablet (25 mg total) by mouth 2 (two) times daily. 60 tablet 11   No current facility-administered medications for this visit.     No Known Allergies  Social History   Social History  . Marital status: Married    Spouse name: N/A  . Number of children: 3  . Years of education: N/A   Occupational History  . Trash collection  Unemployed   Social History Main Topics  . Smoking status: Never Smoker  . Smokeless tobacco: Never Used  . Alcohol use No  . Drug use: No  . Sexual activity: Yes   Other Topics Concern  . Not on file   Social History Narrative  . No narrative on file    Family History  Problem Relation Age of Onset  . Diabetes Mother   . Heart attack Father     Review of Systems:  As stated in the HPI and otherwise negative.   BP 138/82   Pulse 81   Ht 5\' 10"  (1.778 m)   Wt 259 lb 6.4 oz (117.7 kg)   SpO2 97%   BMI 37.22 kg/m   Physical Examination:  General: Well developed, well nourished, NAD  HEENT: OP clear, mucus membranes moist  SKIN: warm, dry. No rashes. Neuro: No focal deficits  Musculoskeletal: Muscle strength 5/5 all ext  Psychiatric: Mood and affect normal  Neck: No JVD, no carotid bruits, no thyromegaly, no lymphadenopathy.  Lungs:Clear bilaterally, no wheezes, rhonci, crackles Cardiovascular: Regular rate and rhythm. No murmurs, gallops or rubs. Abdomen:Soft. Bowel sounds present. Non-tender.  Extremities: No lower extremity edema. Pulses are 2 + in the bilateral DP/PT.   EKG:  EKG is not ordered today. The ekg ordered today demonstrates NSR, rate 81 bpm. LVH, Non specific ST abn with ST elevation lead V2, V3, unchanged from EKG on 05/07/17  Recent Labs: No results found for requested labs within last 8760 hours.   Lipid Panel Followed in endocrine clinic.    Wt Readings from Last 3 Encounters:  06/22/17 259 lb 6.4 oz (117.7 kg)  07/31/16 240 lb 6.4 oz (109 kg)  05/28/16 238 lb 12.8 oz (108.3 kg)     Other studies Reviewed: Additional studies/ records that were reviewed today include: . Review of the above records demonstrates:    Assessment and Plan:   1. CAD without angina: He is doing well. No chest pain suggestive of angina. Will continue study protocol (TWILIGHT) for now. He will be at the end of his study protocol in November 2018. At that time  we will change Brilinta to Plavix and will start ASA 81 mg daily. Will continue statin, Ace-inh. Will start Lopressor 25 mg po BID. Reports in chart for years of resting bradycardia but every HR I can see is above 65.   2. HTN: BP is well controlled.    3. HLD: This is followed in the endocrine clinic. He is on a statin.   Current medicines are reviewed at length with the patient today.  The patient does not have concerns regarding medicines.  The following changes have been made:  no change  Labs/ tests ordered today include:  Orders Placed This Encounter  Procedures  . EKG 12-Lead     Disposition:   FU with me one year   Signed, Lauree Chandler, MD 06/22/2017 10:26 AM    [facility] [address], [place], [place]  [address] Phone: [phone]; Fax: [phone]

## 2017-06-22 NOTE — Patient Instructions (Signed)
Medication Instructions:  Your physician has recommended you make the following change in your medication:  Start metoprolol tartrate 25 mg by mouth twice daily.    Labwork: none  Testing/Procedures: none  Follow-Up: Your physician recommends that you schedule a follow-up appointment in: 12 months.  Please call our office in about 9 months to schedule this appointment.     Any Other Special Instructions Will Be Listed Below (If Applicable).     If you need a refill on your cardiac medications before your next appointment, please call your pharmacy.

## 2017-07-07 MED FILL — QUINAPRIL 40 MG TABLET: 40 | 90 days supply | Qty: 90 | Fill #0

## 2017-07-07 MED FILL — EZETIMIBE 10 MG TABLET: 10 | 30 days supply | Qty: 30 | Fill #1

## 2017-07-08 MED FILL — METOPROLOL TARTRATE 25 MG T: 25 | 30 days supply | Qty: 60 | Fill #0

## 2017-07-24 MED FILL — SILDENAFIL CITRATE 100 MG T: 100 | 50 days supply | Qty: 10 | Fill #0

## 2017-07-24 MED FILL — WELCHOL 625 MG TABLET: 625 | 30 days supply | Qty: 60 | Fill #2

## 2017-07-28 MED FILL — HUMALOG MIX 50-50 KWIKPEN: (50-50) 100 | 30 days supply | Qty: 30 | Fill #6

## 2017-08-17 MED FILL — EZETIMIBE 10 MG TABS: 10 | 30 days supply | Qty: 30 | Fill #2

## 2017-08-17 MED FILL — WELCHOL 625 MG TABLET: 625 | 90 days supply | Qty: 180 | Fill #0

## 2017-08-19 ENCOUNTER — Encounter: Payer: 59 | Admitting: *Deleted

## 2017-08-19 DIAGNOSIS — Z006 Encounter for examination for normal comparison and control in clinical research program: Secondary | ICD-10-CM

## 2017-08-19 MED ORDER — ASPIRIN EC 81 MG PO TBEC: 81.0000 mg | DELAYED_RELEASE_TABLET | Freq: Every day | ORAL | 3 refills | Status: AC

## 2017-08-19 MED ORDER — CLOPIDOGREL BISULFATE 75 MG PO TABS
75.0000 mg | ORAL_TABLET | Freq: Every day | ORAL | 3 refills | Status: DC
Start: 2017-08-19 — End: 2018-08-30

## 2017-08-19 MED FILL — CLOPIDOGREL 75 MG TABLET: 75 | 90 days supply | Qty: 90 | Fill #0

## 2017-08-19 NOTE — Progress Notes (Signed)
TWILIGHT 15 month follow up completed. Patient has been 98 % compliant with ASA/Placebo and 92 % complaint with Brilinta. He denies bleeding events or other adverse events. He has been prescribed Plavix 75 mg daily and ASA 81 mg daily. I will send this to his pharmacy as instructed by Dr. Angelena Form. ( He has a few Brilinta he will take until script is filled & he will start ASA tomorrow) He has 1 last remaining telephone follow up due no later than 7/Feb/2019. I thanked him for his participation in the study. He thanked me for the free medication.

## 2017-08-31 DIAGNOSIS — Z794 Long term (current) use of insulin: Secondary | ICD-10-CM | POA: Diagnosis not present

## 2017-08-31 DIAGNOSIS — E113292 Type 2 diabetes mellitus with mild nonproliferative diabetic retinopathy without macular edema, left eye: Secondary | ICD-10-CM | POA: Diagnosis not present

## 2017-08-31 DIAGNOSIS — E1165 Type 2 diabetes mellitus with hyperglycemia: Secondary | ICD-10-CM | POA: Diagnosis not present

## 2017-08-31 DIAGNOSIS — Z5181 Encounter for therapeutic drug level monitoring: Secondary | ICD-10-CM | POA: Diagnosis not present

## 2017-08-31 DIAGNOSIS — H35033 Hypertensive retinopathy, bilateral: Secondary | ICD-10-CM | POA: Diagnosis not present

## 2017-09-01 MED FILL — UNIFINE PENTIPS 32GX5/32: 32G X 4 MM | 50 days supply | Qty: 100 | Fill #1

## 2017-09-01 MED FILL — UNIFINE PENTIPS 32GX5/32": 32G X 4 MM | 50 days supply | Qty: 100 | Fill #1

## 2017-09-09 MED FILL — ROSUVASTATIN CALCIUM 40 MG: 40 | 90 days supply | Qty: 45 | Fill #0

## 2017-09-09 MED FILL — HUMALOG MIX 50-50 KWIKPEN: (50-50) 100 | 30 days supply | Qty: 30 | Fill #7

## 2017-09-10 MED FILL — EZETIMIBE 10 MG TABS: 10 | 30 days supply | Qty: 30 | Fill #0

## 2017-10-09 MED FILL — QUINAPRIL 40 MG TABLET: 40 | 90 days supply | Qty: 90 | Fill #0

## 2017-10-09 MED FILL — SILDENAFIL CITRATE 100 MG T: 100 | 30 days supply | Qty: 6 | Fill #0

## 2017-10-13 DIAGNOSIS — Z83511 Family history of glaucoma: Secondary | ICD-10-CM | POA: Diagnosis not present

## 2017-10-13 DIAGNOSIS — H18413 Arcus senilis, bilateral: Secondary | ICD-10-CM | POA: Diagnosis not present

## 2017-10-13 DIAGNOSIS — H2513 Age-related nuclear cataract, bilateral: Secondary | ICD-10-CM | POA: Diagnosis not present

## 2017-10-13 DIAGNOSIS — H11153 Pinguecula, bilateral: Secondary | ICD-10-CM | POA: Diagnosis not present

## 2017-10-13 DIAGNOSIS — H40053 Ocular hypertension, bilateral: Secondary | ICD-10-CM | POA: Diagnosis not present

## 2017-10-13 DIAGNOSIS — H40023 Open angle with borderline findings, high risk, bilateral: Secondary | ICD-10-CM | POA: Diagnosis not present

## 2017-10-13 DIAGNOSIS — H16143 Punctate keratitis, bilateral: Secondary | ICD-10-CM | POA: Diagnosis not present

## 2017-10-16 DIAGNOSIS — H35033 Hypertensive retinopathy, bilateral: Secondary | ICD-10-CM | POA: Diagnosis not present

## 2017-10-16 DIAGNOSIS — E113292 Type 2 diabetes mellitus with mild nonproliferative diabetic retinopathy without macular edema, left eye: Secondary | ICD-10-CM | POA: Diagnosis not present

## 2017-10-16 DIAGNOSIS — E1165 Type 2 diabetes mellitus with hyperglycemia: Secondary | ICD-10-CM | POA: Diagnosis not present

## 2017-10-16 DIAGNOSIS — Z5181 Encounter for therapeutic drug level monitoring: Secondary | ICD-10-CM | POA: Diagnosis not present

## 2017-10-16 DIAGNOSIS — Z794 Long term (current) use of insulin: Secondary | ICD-10-CM | POA: Diagnosis not present

## 2017-10-20 MED FILL — EZETIMIBE 10 MG TABS: 10 | 30 days supply | Qty: 30 | Fill #0

## 2017-10-20 MED FILL — HUMALOG MIX 50-50 KWIKPEN: (50-50) 100 | 30 days supply | Qty: 30 | Fill #8

## 2017-11-02 ENCOUNTER — Other Ambulatory Visit: Payer: Self-pay | Admitting: Cardiology

## 2017-11-02 DIAGNOSIS — I152 Hypertension secondary to endocrine disorders: Secondary | ICD-10-CM

## 2017-11-02 DIAGNOSIS — E1159 Type 2 diabetes mellitus with other circulatory complications: Secondary | ICD-10-CM

## 2017-11-02 DIAGNOSIS — I10 Essential (primary) hypertension: Principal | ICD-10-CM

## 2017-11-03 ENCOUNTER — Telehealth: Payer: Self-pay | Admitting: *Deleted

## 2017-11-03 DIAGNOSIS — G603 Idiopathic progressive neuropathy: Secondary | ICD-10-CM | POA: Diagnosis not present

## 2017-11-03 DIAGNOSIS — B351 Tinea unguium: Secondary | ICD-10-CM | POA: Diagnosis not present

## 2017-11-03 DIAGNOSIS — E1351 Other specified diabetes mellitus with diabetic peripheral angiopathy without gangrene: Secondary | ICD-10-CM | POA: Diagnosis not present

## 2017-11-03 DIAGNOSIS — M79671 Pain in right foot: Secondary | ICD-10-CM | POA: Diagnosis not present

## 2017-11-03 NOTE — Telephone Encounter (Signed)
TWILIGHT Research study month 18 call attempted. Left message for patient to call office.

## 2017-11-09 ENCOUNTER — Telehealth: Payer: Self-pay | Admitting: *Deleted

## 2017-11-09 NOTE — Telephone Encounter (Signed)
Left message for patient to call research office for last TWILIGHT research study follow call.

## 2017-11-12 ENCOUNTER — Encounter: Payer: Self-pay | Admitting: *Deleted

## 2017-11-12 DIAGNOSIS — Z006 Encounter for examination for normal comparison and control in clinical research program: Secondary | ICD-10-CM

## 2017-11-12 MED FILL — AMLODIPINE BESYLATE 5 MG TA: 5 | 90 days supply | Qty: 135 | Fill #0

## 2017-11-12 NOTE — Progress Notes (Signed)
TWILIGHT research study 18 month follow up completed. Patient still taking Plavix and ASA without any adverse events. I thanked him for his participation in the study.

## 2017-11-17 MED FILL — UNIFINE PENTIPS 32GX5/32: 32G X 4 MM | 50 days supply | Qty: 100 | Fill #0

## 2017-11-17 MED FILL — UNIFINE PENTIPS 32GX5/32": 32G X 4 MM | 50 days supply | Qty: 100 | Fill #0

## 2017-11-17 MED FILL — CLOPIDOGREL 75 MG TABLET: 75 | 90 days supply | Qty: 90 | Fill #1

## 2017-11-17 MED FILL — EZETIMIBE 10 MG TABS: 10 | 30 days supply | Qty: 30 | Fill #0

## 2017-11-20 DIAGNOSIS — E113292 Type 2 diabetes mellitus with mild nonproliferative diabetic retinopathy without macular edema, left eye: Secondary | ICD-10-CM | POA: Diagnosis not present

## 2017-11-20 DIAGNOSIS — E1165 Type 2 diabetes mellitus with hyperglycemia: Secondary | ICD-10-CM | POA: Diagnosis not present

## 2017-11-20 DIAGNOSIS — Z23 Encounter for immunization: Secondary | ICD-10-CM | POA: Diagnosis not present

## 2017-11-20 DIAGNOSIS — Z794 Long term (current) use of insulin: Secondary | ICD-10-CM | POA: Diagnosis not present

## 2017-11-20 DIAGNOSIS — H35033 Hypertensive retinopathy, bilateral: Secondary | ICD-10-CM | POA: Diagnosis not present

## 2017-11-20 DIAGNOSIS — Z5181 Encounter for therapeutic drug level monitoring: Secondary | ICD-10-CM | POA: Diagnosis not present

## 2017-11-24 MED FILL — WELCHOL 625 MG TABLET: 625 | 90 days supply | Qty: 180 | Fill #1

## 2017-11-24 MED FILL — VIT D2 1.25 MG (50,000 UNIT: 1.25 MG | 14 days supply | Qty: 6 | Fill #1

## 2017-11-26 MED FILL — METOPROLOL TARTRATE 25 MG T: 25 | 30 days supply | Qty: 60 | Fill #1

## 2017-11-26 MED FILL — HUMALOG MIX 50-50 KWIKPEN: (50-50) 100 | 30 days supply | Qty: 30 | Fill #0

## 2017-12-02 MED FILL — SILDENAFIL CITRATE 100 MG T: 100 | 30 days supply | Qty: 6 | Fill #1

## 2017-12-03 DIAGNOSIS — R110 Nausea: Secondary | ICD-10-CM | POA: Diagnosis not present

## 2017-12-03 DIAGNOSIS — E119 Type 2 diabetes mellitus without complications: Secondary | ICD-10-CM | POA: Diagnosis not present

## 2017-12-03 DIAGNOSIS — I10 Essential (primary) hypertension: Secondary | ICD-10-CM | POA: Diagnosis not present

## 2017-12-03 DIAGNOSIS — R42 Dizziness and giddiness: Secondary | ICD-10-CM | POA: Diagnosis not present

## 2017-12-03 MED FILL — PROMETHAZINE 25 MG TABLET: 25 | 2 days supply | Qty: 15 | Fill #0

## 2017-12-03 MED FILL — MECLIZINE 25 MG TABLET: 25 | 3 days supply | Qty: 15 | Fill #0

## 2017-12-09 DIAGNOSIS — R42 Dizziness and giddiness: Secondary | ICD-10-CM | POA: Diagnosis not present

## 2017-12-09 DIAGNOSIS — R1011 Right upper quadrant pain: Secondary | ICD-10-CM | POA: Diagnosis not present

## 2017-12-09 MED FILL — MECLIZINE 25 MG TABLET: 25 | 5 days supply | Qty: 8 | Fill #0

## 2017-12-14 ENCOUNTER — Other Ambulatory Visit: Payer: Self-pay | Admitting: Family Medicine

## 2017-12-14 DIAGNOSIS — H9190 Unspecified hearing loss, unspecified ear: Secondary | ICD-10-CM

## 2017-12-14 DIAGNOSIS — R1011 Right upper quadrant pain: Secondary | ICD-10-CM

## 2017-12-14 DIAGNOSIS — R42 Dizziness and giddiness: Secondary | ICD-10-CM

## 2017-12-17 MED FILL — EZETIMIBE 10 MG TABS: 10 | 30 days supply | Qty: 30 | Fill #1

## 2017-12-17 MED FILL — ROSUVASTATIN CALCIUM 40 MG: 40 | 90 days supply | Qty: 45 | Fill #0

## 2017-12-23 ENCOUNTER — Ambulatory Visit
Admission: RE | Admit: 2017-12-23 | Discharge: 2017-12-23 | Disposition: A | Discharge status: Home / Self Care | Payer: 59 | Source: Ambulatory Visit | Attending: Family Medicine | Admitting: Family Medicine

## 2017-12-23 DIAGNOSIS — H9190 Unspecified hearing loss, unspecified ear: Secondary | ICD-10-CM

## 2017-12-23 DIAGNOSIS — R42 Dizziness and giddiness: Secondary | ICD-10-CM | POA: Diagnosis not present

## 2017-12-23 DIAGNOSIS — K760 Fatty (change of) liver, not elsewhere classified: Secondary | ICD-10-CM | POA: Diagnosis not present

## 2017-12-23 DIAGNOSIS — R1011 Right upper quadrant pain: Secondary | ICD-10-CM

## 2017-12-23 MED ORDER — GADOBENATE DIMEGLUMINE 529 MG/ML IV SOLN
20.0000 mL | Freq: Once | INTRAVENOUS | Status: AC | PRN
  Administered 2017-12-23: 20 mL via INTRAVENOUS

## 2017-12-25 MED FILL — HUMALOG MIX 50-50 KWIKPEN: (50-50) 100 | 30 days supply | Qty: 30 | Fill #1

## 2017-12-25 MED FILL — PANTOPRAZOLE SOD DR 40 MG T: 40 | 30 days supply | Qty: 30 | Fill #0

## 2018-01-11 MED FILL — QUINAPRIL 40 MG TABLET: 40 | 90 days supply | Qty: 90 | Fill #0

## 2018-01-14 MED FILL — UNIFINE PENTIPS 32GX5/32": 32G X 4 MM | 50 days supply | Qty: 100 | Fill #1

## 2018-01-14 MED FILL — UNIFINE PENTIPS 32GX5/32: 32G X 4 MM | 50 days supply | Qty: 100 | Fill #1

## 2018-01-18 DIAGNOSIS — H35033 Hypertensive retinopathy, bilateral: Secondary | ICD-10-CM | POA: Diagnosis not present

## 2018-01-18 DIAGNOSIS — E113292 Type 2 diabetes mellitus with mild nonproliferative diabetic retinopathy without macular edema, left eye: Secondary | ICD-10-CM | POA: Diagnosis not present

## 2018-01-18 DIAGNOSIS — Z794 Long term (current) use of insulin: Secondary | ICD-10-CM | POA: Diagnosis not present

## 2018-01-18 DIAGNOSIS — E1165 Type 2 diabetes mellitus with hyperglycemia: Secondary | ICD-10-CM | POA: Diagnosis not present

## 2018-01-18 DIAGNOSIS — Z5181 Encounter for therapeutic drug level monitoring: Secondary | ICD-10-CM | POA: Diagnosis not present

## 2018-01-18 MED FILL — METOPROLOL TARTRATE 25 MG T: 25 | 30 days supply | Qty: 60 | Fill #2

## 2018-01-18 MED FILL — EZETIMIBE 10 MG TABS: 10 | 30 days supply | Qty: 30 | Fill #2

## 2018-02-01 MED FILL — HUMALOG MIX 50-50 KWIKPEN: (50-50) 100 | 30 days supply | Qty: 30 | Fill #2

## 2018-02-03 DIAGNOSIS — E119 Type 2 diabetes mellitus without complications: Secondary | ICD-10-CM | POA: Diagnosis not present

## 2018-02-03 DIAGNOSIS — Z7984 Long term (current) use of oral hypoglycemic drugs: Secondary | ICD-10-CM | POA: Diagnosis not present

## 2018-02-03 DIAGNOSIS — I10 Essential (primary) hypertension: Secondary | ICD-10-CM | POA: Diagnosis not present

## 2018-02-16 DIAGNOSIS — H40053 Ocular hypertension, bilateral: Secondary | ICD-10-CM | POA: Diagnosis not present

## 2018-02-16 DIAGNOSIS — H40023 Open angle with borderline findings, high risk, bilateral: Secondary | ICD-10-CM | POA: Diagnosis not present

## 2018-02-18 MED FILL — WELCHOL 625 MG TABLET: 625 | 30 days supply | Qty: 60 | Fill #2

## 2018-02-18 MED FILL — EZETIMIBE 10 MG TABS: 10 | 30 days supply | Qty: 30 | Fill #3

## 2018-02-18 MED FILL — CLOPIDOGREL 75 MG TABLET: 75 | 90 days supply | Qty: 90 | Fill #2

## 2018-03-04 MED FILL — SILDENAFIL CITRATE 100 MG T: 100 | 30 days supply | Qty: 6 | Fill #2

## 2018-03-04 MED FILL — HUMALOG MIX 50-50 KWIKPEN: (50-50) 100 | 30 days supply | Qty: 30 | Fill #3

## 2018-03-12 ENCOUNTER — Telehealth: Payer: Self-pay | Admitting: Cardiovascular Disease

## 2018-03-12 DIAGNOSIS — Z1211 Encounter for screening for malignant neoplasm of colon: Secondary | ICD-10-CM | POA: Diagnosis not present

## 2018-03-12 NOTE — Telephone Encounter (Signed)
° °  [facility] Pre-operative Risk Assessment    Request for surgical clearance:  1. What type of surgery is being performed? Colonoscopy   2. When is this surgery scheduled? 05/17/18  3. What type of clearance is required (medical clearance vs. Pharmacy clearance to hold med vs. Both)? Both   4. Are there any medications that need to be held prior to surgery and how long? Brilinta, 5 days before and any other medications that need to be held  5. Practice name and name of physician performing surgery? [facility], Dr. Therisa Doyne  6. What is your office phone number [phone]   7.   What is your office fax number [phone]  8.   Anesthesia type (None, local, MAC, general) ? Not listed   Philip Black 03/12/2018, 11:38 AM  _________________________________________________________________   (provider comments below)

## 2018-03-12 NOTE — Telephone Encounter (Signed)
Spoke with pt, Follow up scheduled 05-03-18 with NP for clearance. This note will be faxed to [facility]

## 2018-03-12 NOTE — Telephone Encounter (Signed)
   Primary Cardiologist:Dr McAlhaney  Chart reviewed as part of pre-operative protocol coverage. Because of Philip Black's past medical history and time since last visit, he/she will require a follow-up visit in order to better assess preoperative cardiovascular risk.  Pre-op covering staff: - Please schedule appointment with Dr Julianne Handler or an APP at [facility] for pre op clearance and call patient to inform them. - Please contact requesting surgeon's office via preferred method (i.e, phone, fax) to inform them of need for appointment prior to surgery.  Kerin Ransom, PA-C  03/12/2018, 11:55 AM

## 2018-03-23 MED FILL — AMLODIPINE BESYLATE 5 MG TA: 5 | 90 days supply | Qty: 135 | Fill #1

## 2018-03-23 MED FILL — COLESEVELAM HCL 625 MG TABS: 625 | 90 days supply | Qty: 180 | Fill #0

## 2018-03-23 MED FILL — EZETIMIBE 10 MG TABS: 10 | 30 days supply | Qty: 30 | Fill #4

## 2018-03-23 MED FILL — ROSUVASTATIN CALCIUM 40 MG: 40 | 90 days supply | Qty: 45 | Fill #0

## 2018-03-29 MED FILL — METOPROLOL TARTRATE 25 MG T: 25 | 30 days supply | Qty: 60 | Fill #3

## 2018-03-29 MED FILL — SILDENAFIL CITRATE 100 MG T: 100 | 30 days supply | Qty: 6 | Fill #3

## 2018-04-07 MED FILL — HUMALOG MIX 50-50 KWIKPEN: (50-50) 100 | 30 days supply | Qty: 30 | Fill #4

## 2018-04-07 MED FILL — UNIFINE PENTIPS 32GX5/32: 32G X 4 MM | 90 days supply | Qty: 200 | Fill #0

## 2018-04-07 MED FILL — UNIFINE PENTIPS 32GX5/32": 32G X 4 MM | 90 days supply | Qty: 200 | Fill #0

## 2018-04-14 MED FILL — QUINAPRIL 40 MG TABLET: 40 | 90 days supply | Qty: 90 | Fill #0

## 2018-04-20 DIAGNOSIS — E1351 Other specified diabetes mellitus with diabetic peripheral angiopathy without gangrene: Secondary | ICD-10-CM | POA: Diagnosis not present

## 2018-04-20 DIAGNOSIS — G603 Idiopathic progressive neuropathy: Secondary | ICD-10-CM | POA: Diagnosis not present

## 2018-04-20 DIAGNOSIS — B351 Tinea unguium: Secondary | ICD-10-CM | POA: Diagnosis not present

## 2018-04-20 DIAGNOSIS — M79671 Pain in right foot: Secondary | ICD-10-CM | POA: Diagnosis not present

## 2018-04-29 DIAGNOSIS — M545 Low back pain: Secondary | ICD-10-CM | POA: Diagnosis not present

## 2018-04-29 DIAGNOSIS — E119 Type 2 diabetes mellitus without complications: Secondary | ICD-10-CM | POA: Diagnosis not present

## 2018-04-29 MED FILL — CYCLOBENZAPRINE HCL 5 MG TA: 5 | 30 days supply | Qty: 30 | Fill #0

## 2018-04-30 NOTE — Progress Notes (Signed)
Cardiology Office Note   Date:  05/03/2018   ID:  Domnick, Chervenak 1956-05-10, MRN [id number]  PCP:  Aura Dials, MD  Cardiologist:  Dr. Angelena Form    Chief Complaint  Patient presents with  . Coronary Artery Disease  . Hypertension      History of Present Illness: Philip Black is a 62 y.o. male who presents for CAD.  Last seen by Dr. Angelena Form 06/2017.   Pt with a history of CAD, HTN, HLD, DM, OSA .   In May 2009 he had an anterior MI treated with a drug-eluting stent in the LAD.   In the office in July 2017 and he had symptoms c/w unstable angina. Cardiac cath August 2017 with severe stenosis proximal Circumflex treated with a DES x 1. Severe stenosis small caliber non-dominant RCA treated medically. LV function normal by LV gram in August 2017. He has not tolerated beta blockers due to bradycardia  Today he has no complaints of chest pain or SOB, he has had low back pain and due to back pain is not able to exercise.  No palpitations and no syncope.  His BP is elevated today but he has not taken his meds.  He is trying to eat heathy and had been exercising 30 min 3 X week.  No blood is his stool or urine.  He did see urgent care for his back pain.     Past Medical History:  Diagnosis Date  . Arthritis    "shoulders, right hip" (05/07/2016)  . CAD S/P percutaneous coronary angioplasty    a. DES to LAD in 2009 b. cath: 05/2016 w/ patent stent to LAD, 99% stenosis Prox Cx (treated w/ DES), and 99% stenosis of small caliber non-dominant RCA (no intervention performed).  . High cholesterol   . Hypertension   . Metabolic syndrome   . OSA (obstructive sleep apnea)    "used mask; lost weight; stopped using mask" (05/07/2016)  . Type II diabetes mellitus ([place])     Past Surgical History:  Procedure Laterality Date  . CARDIAC CATHETERIZATION N/A 05/07/2016   Procedure: Left Heart Cath and Coronary Angiography;  Surgeon: Burnell Blanks, MD;  Location: [facility];   Service: Cardiovascular;  Laterality: N/A;  . CARDIAC CATHETERIZATION N/A 05/07/2016   Procedure: Coronary Stent Intervention;  Surgeon: Burnell Blanks, MD;  Location: [facility];  Service: Cardiovascular;  Laterality: N/A;  . CORONARY ANGIOPLASTY WITH STENT PLACEMENT  02/2008; 05/07/2016  . TONSILLECTOMY  ~ 1969     Current Outpatient Medications  Medication Sig Dispense Refill  . amLODipine (NORVASC) 5 MG tablet Take 5 mg by mouth daily.    Marland Kitchen aspirin EC 81 MG tablet Take 1 tablet (81 mg total) daily by mouth. 90 tablet 3  . clopidogrel (PLAVIX) 75 MG tablet Take 1 tablet (75 mg total) daily by mouth. 90 tablet 3  . colesevelam (WELCHOL) 625 MG tablet Take 1,250 mg by mouth daily.    Marland Kitchen ezetimibe (ZETIA) 10 MG tablet Take 10 mg by mouth daily.    . Insulin Pen Needle 32G X 4 MM MISC 1 each by Does not apply route daily. 100 each 6  . nitroGLYCERIN (NITROSTAT) 0.4 MG SL tablet Place 1 tablet (0.4 mg total) under the tongue every 5 (five) minutes as needed for chest pain (X 3 DOSES MAX). 25 tablet 6  . quinapril (ACCUPRIL) 40 MG tablet TAKE 1 TABLET EVERY DAY FOR BLOOD PRESSURE 30 tablet  0  . sildenafil (VIAGRA) 100 MG tablet Take 1 tablet (100 mg total) by mouth daily as needed for erectile dysfunction. 10 tablet 5  . Vitamin D, Ergocalciferol, (DRISDOL) 50000 units CAPS capsule Take 1 capsule by mouth 3 (three) times a week.  0  . LEVEMIR FLEXTOUCH 100 UNIT/ML Pen INJECT 0.1 MLS (10 UNITS TOTAL) INTO THE SKIN AT BEDTIME. 3 mL 3  . metoprolol tartrate (LOPRESSOR) 25 MG tablet Take 1 tablet (25 mg total) by mouth 2 (two) times daily. 60 tablet 11  . quinapril (ACCUPRIL) 40 MG tablet Take 40 mg by mouth at bedtime. BLOOD PRESSURE    . rosuvastatin (CRESTOR) 40 MG tablet Take 1 tablet (40 mg total) by mouth daily. 90 tablet 3   No current facility-administered medications for this visit.     Allergies:   Patient has no known allergies.    Social History:  The patient  reports  that he has never smoked. He has never used smokeless tobacco. He reports that he does not drink alcohol or use drugs.   Family History:  The patient's family history includes Diabetes in his mother; Heart attack in his father.    ROS:  General:no colds or fevers, no weight changes Skin:no rashes or ulcers HEENT:no blurred vision, no congestion CV:see HPI PUL:see HPI GI:no diarrhea constipation or melena, no indigestion GU:no hematuria, no dysuria MS:no joint pain, no claudication + back pain Neuro:no syncope, no lightheadedness Endo:+ diabetes, no thyroid disease  Wt Readings from Last 3 Encounters:  05/03/18 260 lb (117.9 kg)  06/22/17 259 lb 6.4 oz (117.7 kg)  07/31/16 240 lb 6.4 oz (109 kg)     PHYSICAL EXAM: VS:  BP (!) 165/98   Pulse 80   Ht 5\' 10"  (1.778 m)   Wt 260 lb (117.9 kg)   BMI 37.31 kg/m  , BMI Body mass index is 37.31 kg/m. General:Pleasant affect, NAD Skin:Warm and dry, brisk capillary refill HEENT:normocephalic, sclera clear, mucus membranes moist Neck:supple, no JVD, no bruits  Heart:S1S2 RRR without murmur, gallup, rub or click Lungs:clear without rales, rhonchi, or wheezes LTJ:QZES, non tender, + BS, do not palpate liver spleen or masses Ext:no lower ext edema, 2+ pedal pulses, 2+ radial pulses Neuro:alert and oriented, X 3 MAE, follows commands, + facial symmetry    EKG:  EKG is ordered today. The ekg ordered today demonstrates SR at 89 and ST abnormality but no acute changes from previous.   Recent Labs: No results found for requested labs within last 8760 hours.    Lipid Panel    Component Value Date/Time   CHOL 137 10/24/2013 1152   TRIG 60 10/24/2013 1152   HDL 62 10/24/2013 1152   CHOLHDL 2.2 10/24/2013 1152   VLDL 12 10/24/2013 1152   LDLCALC 63 10/24/2013 1152   LDLDIRECT 64 08/08/2009    T chol 01/2018 182, HDL 65, LDL 97 and TG 98     Other studies Reviewed: Additional studies/ records that were reviewed today include:  .  Cardiac cath 05/2016   Mid RCA lesion, 99 %stenosed.  Prox RCA lesion, 90 %stenosed.  Ost 2nd Mrg to 2nd Mrg lesion, 80 %stenosed.  4th Mrg lesion, 50 %stenosed.  Ost 1st Mrg lesion, 50 %stenosed.  1st Mrg lesion, 50 %stenosed.  Ost LAD to Prox LAD lesion, 10 %stenosed.  A STENT PROMUS PREM MR 3.0X12 drug eluting stent was successfully placed.  Ost Cx to Prox Cx lesion, 99 %stenosed.  Post intervention, there is a  0% residual stenosis.  Prox LAD lesion, 30 %stenosed.  The left ventricular systolic function is normal.  LV end diastolic pressure is normal.  The left ventricular ejection fraction is 50-55% by visual estimate.  There is no mitral valve regurgitation.   1. Triple vessel CAD 2. Patent stent proximal LAD with minimal restenosis 3. Severe stenosis ostial/proximal Circumflex 4. Successful PTCA/DES x 1 proximal Circumflex 5. Severe stenosis small caliber non-dominant RCA 6. Low normal LV systolic function  Recommendations: Will start Brilinta and ASA and Brilinta for now. He will be enrolled in the TWILIGHT study. Continue statin.   ASSESSMENT AND PLAN:  1.  CAD without angina and hx PCI 05/2016 and patent stent to pLAD, PCI to [place] and stenosis sm. caliber non- dominant RCA.  Continue plavix, BB, ACE ASA and statin.  Follow up in 6 months with Dr. Angelena Form unless problems prior to that time.   2.  HLD with LDL of 97 will increase crestor to 40 mg daily, continue zetia and recheck hepatic and lipids in 3 months.  Goal LDL <70  3.  HTN elevated but pt has not yet had meds.   4.  Back pain should follow with his PCP, has been seen by urgent care but may need further tests  5.  DM per PCP   Current medicines are reviewed with the patient today.  The patient Has no concerns regarding medicines.  The following changes have been made:  See above Labs/ tests ordered today include:see above  Disposition:   FU:  see above  Signed, Cecilie Kicks, NP   05/03/2018 1:03 PM    [facility] [place], [place], [address] [place], [place] Phone: [phone]; Fax: [phone]

## 2018-05-03 ENCOUNTER — Ambulatory Visit: Payer: 59 | Admitting: Cardiology

## 2018-05-03 ENCOUNTER — Encounter: Payer: Self-pay | Admitting: Cardiology

## 2018-05-03 VITALS — BP 165/98 | HR 80 | Ht 70.0 in | Wt 260.0 lb

## 2018-05-03 DIAGNOSIS — M549 Dorsalgia, unspecified: Secondary | ICD-10-CM

## 2018-05-03 DIAGNOSIS — G8929 Other chronic pain: Secondary | ICD-10-CM | POA: Diagnosis not present

## 2018-05-03 DIAGNOSIS — I2510 Atherosclerotic heart disease of native coronary artery without angina pectoris: Secondary | ICD-10-CM

## 2018-05-03 DIAGNOSIS — E782 Mixed hyperlipidemia: Secondary | ICD-10-CM | POA: Diagnosis not present

## 2018-05-03 DIAGNOSIS — I10 Essential (primary) hypertension: Secondary | ICD-10-CM | POA: Diagnosis not present

## 2018-05-03 MED ORDER — NITROGLYCERIN 0.4 MG SL SUBL: 0.4000 mg | SUBLINGUAL_TABLET | SUBLINGUAL | 6 refills | Status: DC | PRN

## 2018-05-03 MED ORDER — ROSUVASTATIN CALCIUM 40 MG PO TABS: 40.0000 mg | ORAL_TABLET | Freq: Every day | ORAL | 3 refills | Status: DC

## 2018-05-03 MED FILL — PEG-3350 SOLUTION: 420 | 2 days supply | Qty: 4000 | Fill #0

## 2018-05-03 MED FILL — EZETIMIBE 10 MG TABLET: 10 | 30 days supply | Qty: 30 | Fill #5

## 2018-05-03 MED FILL — ROSUVASTATIN CALCIUM 40 MG: 40 | 90 days supply | Qty: 90 | Fill #0

## 2018-05-03 MED FILL — NITROGLYCERIN 0.4 MG TAB SL: 0.4 | 8 days supply | Qty: 25 | Fill #0

## 2018-05-03 NOTE — Patient Instructions (Signed)
Medication Instructions:  1. INCREASE CRESTOR TO 40 MG DAILY; NEW RX HAS BEEN SENT IN TODAY  Labwork: FASTING LIPID AND LIVER PANEL TO BE DONE IN 3 MONTHS   Testing/Procedures: NONE ORDERED TODAY  Follow-Up: 1 YEAR WITH DR. Angelena Form  Any Other Special Instructions Will Be Listed Below (If Applicable).     If you need a refill on your cardiac medications before your next appointment, please call your pharmacy.

## 2018-05-04 DIAGNOSIS — H35033 Hypertensive retinopathy, bilateral: Secondary | ICD-10-CM | POA: Diagnosis not present

## 2018-05-04 DIAGNOSIS — E1165 Type 2 diabetes mellitus with hyperglycemia: Secondary | ICD-10-CM | POA: Diagnosis not present

## 2018-05-04 DIAGNOSIS — E113292 Type 2 diabetes mellitus with mild nonproliferative diabetic retinopathy without macular edema, left eye: Secondary | ICD-10-CM | POA: Diagnosis not present

## 2018-05-04 DIAGNOSIS — R319 Hematuria, unspecified: Secondary | ICD-10-CM | POA: Diagnosis not present

## 2018-05-04 DIAGNOSIS — Z125 Encounter for screening for malignant neoplasm of prostate: Secondary | ICD-10-CM | POA: Diagnosis not present

## 2018-05-04 DIAGNOSIS — Z794 Long term (current) use of insulin: Secondary | ICD-10-CM | POA: Diagnosis not present

## 2018-05-04 DIAGNOSIS — Z5181 Encounter for therapeutic drug level monitoring: Secondary | ICD-10-CM | POA: Diagnosis not present

## 2018-05-12 MED FILL — HUMALOG MIX 50-50 KWIKPEN: (50-50) 100 | 30 days supply | Qty: 30 | Fill #5

## 2018-05-17 DIAGNOSIS — K635 Polyp of colon: Secondary | ICD-10-CM | POA: Diagnosis not present

## 2018-05-17 DIAGNOSIS — D126 Benign neoplasm of colon, unspecified: Secondary | ICD-10-CM | POA: Diagnosis not present

## 2018-05-17 DIAGNOSIS — Z1211 Encounter for screening for malignant neoplasm of colon: Secondary | ICD-10-CM | POA: Diagnosis not present

## 2018-05-25 DIAGNOSIS — E113292 Type 2 diabetes mellitus with mild nonproliferative diabetic retinopathy without macular edema, left eye: Secondary | ICD-10-CM | POA: Diagnosis not present

## 2018-05-25 DIAGNOSIS — E119 Type 2 diabetes mellitus without complications: Secondary | ICD-10-CM | POA: Diagnosis not present

## 2018-05-27 MED FILL — CLOPIDOGREL 75 MG TABLET: 75 | 90 days supply | Qty: 90 | Fill #3

## 2018-05-27 MED FILL — METOPROLOL TARTRATE 25 MG T: 25 | 30 days supply | Qty: 60 | Fill #4

## 2018-06-15 MED FILL — EZETIMIBE 10 MG TABLET: 10 | 30 days supply | Qty: 30 | Fill #0

## 2018-06-15 MED FILL — SILDENAFIL CITRATE 100 MG T: 100 | 30 days supply | Qty: 6 | Fill #4

## 2018-06-15 MED FILL — HUMALOG MIX 50-50 KWIKPEN: (50-50) 100 | 30 days supply | Qty: 30 | Fill #6

## 2018-06-22 DIAGNOSIS — M25551 Pain in right hip: Secondary | ICD-10-CM | POA: Diagnosis not present

## 2018-06-29 MED FILL — COLESEVELAM HCL 625 MG TABS: 625 | 90 days supply | Qty: 180 | Fill #0

## 2018-07-07 DIAGNOSIS — M25551 Pain in right hip: Secondary | ICD-10-CM | POA: Diagnosis not present

## 2018-07-13 MED FILL — EZETIMIBE 10 MG TABLET: 10 | 90 days supply | Qty: 90 | Fill #1

## 2018-07-13 MED FILL — QUINAPRIL 40 MG TABLET: 40 | 90 days supply | Qty: 90 | Fill #1

## 2018-07-20 MED FILL — HUMALOG MIX 50-50 KWIKPEN: (50-50) 100 | 30 days supply | Qty: 30 | Fill #7

## 2018-07-30 ENCOUNTER — Other Ambulatory Visit: Payer: Self-pay | Admitting: Cardiovascular Disease

## 2018-07-30 MED FILL — METOPROLOL TARTRATE 25 MG T: 25 | 30 days supply | Qty: 60 | Fill #0

## 2018-08-03 ENCOUNTER — Other Ambulatory Visit: Payer: 59

## 2018-08-03 DIAGNOSIS — I2510 Atherosclerotic heart disease of native coronary artery without angina pectoris: Secondary | ICD-10-CM | POA: Diagnosis not present

## 2018-08-03 DIAGNOSIS — H35033 Hypertensive retinopathy, bilateral: Secondary | ICD-10-CM | POA: Diagnosis not present

## 2018-08-03 DIAGNOSIS — E113292 Type 2 diabetes mellitus with mild nonproliferative diabetic retinopathy without macular edema, left eye: Secondary | ICD-10-CM | POA: Diagnosis not present

## 2018-08-03 DIAGNOSIS — E782 Mixed hyperlipidemia: Secondary | ICD-10-CM | POA: Diagnosis not present

## 2018-08-03 DIAGNOSIS — Z794 Long term (current) use of insulin: Secondary | ICD-10-CM | POA: Diagnosis not present

## 2018-08-03 DIAGNOSIS — Z5181 Encounter for therapeutic drug level monitoring: Secondary | ICD-10-CM | POA: Diagnosis not present

## 2018-08-03 DIAGNOSIS — E1165 Type 2 diabetes mellitus with hyperglycemia: Secondary | ICD-10-CM | POA: Diagnosis not present

## 2018-08-03 LAB — LIPID PANEL
CHOLESTEROL TOTAL: 152 mg/dL (ref 100–199)
Chol/HDL Ratio: 2.3 ratio (ref 0.0–5.0)
HDL: 66 mg/dL (ref >39)
LDL Calculated: 69 mg/dL (ref 0–99)
Triglycerides: 83 mg/dL (ref 0–149)
VLDL CHOLESTEROL CAL: 17 mg/dL (ref 5–40)

## 2018-08-03 LAB — HEPATIC FUNCTION PANEL
ALT: 14 IU/L (ref 0–44)
AST: 30 IU/L (ref 0–40)
Albumin: 4.4 g/dL (ref 3.6–4.8)
Alkaline Phosphatase: 51 IU/L (ref 39–117)
BILIRUBIN TOTAL: 0.6 mg/dL (ref 0.0–1.2)
Bilirubin, Direct: 0.16 mg/dL (ref 0.00–0.40)
Total Protein: 7.2 g/dL (ref 6.0–8.5)

## 2018-08-04 ENCOUNTER — Telehealth: Payer: Self-pay | Admitting: *Deleted

## 2018-08-04 NOTE — Telephone Encounter (Signed)
Called pt re: lab results. Left a message for pt to call back. 

## 2018-08-04 NOTE — Telephone Encounter (Signed)
-----   Message from Isaiah Serge, NP sent at 08/04/2018  9:11 AM EDT ----- Excellent lipids continue crestor.

## 2018-08-12 MED FILL — JARDIANCE 10 MG TABLET: 10 | 90 days supply | Qty: 90 | Fill #0

## 2018-08-13 DIAGNOSIS — M79604 Pain in right leg: Secondary | ICD-10-CM | POA: Diagnosis not present

## 2018-08-13 DIAGNOSIS — I10 Essential (primary) hypertension: Secondary | ICD-10-CM | POA: Diagnosis not present

## 2018-08-13 DIAGNOSIS — E119 Type 2 diabetes mellitus without complications: Secondary | ICD-10-CM | POA: Diagnosis not present

## 2018-08-13 DIAGNOSIS — M79605 Pain in left leg: Secondary | ICD-10-CM | POA: Diagnosis not present

## 2018-08-16 MED FILL — AMLODIPINE BESYLATE 5 MG TA: 5 | 90 days supply | Qty: 135 | Fill #2

## 2018-08-16 MED FILL — UNIFINE PENTIPS 32GX5/32": 32G X 4 MM | 90 days supply | Qty: 200 | Fill #1

## 2018-08-16 MED FILL — UNIFINE PENTIPS 32GX5/32: 32G X 4 MM | 90 days supply | Qty: 200 | Fill #1

## 2018-08-16 MED FILL — HUMALOG MIX 50-50 KWIKPEN: (50-50) 100 | 30 days supply | Qty: 30 | Fill #8

## 2018-08-16 MED FILL — SILDENAFIL CITRATE 100 MG T: 100 | 30 days supply | Qty: 6 | Fill #0

## 2018-08-30 ENCOUNTER — Other Ambulatory Visit: Payer: Self-pay | Admitting: Cardiovascular Disease

## 2018-08-30 MED FILL — ROSUVASTATIN CALCIUM 40 MG: 40 | 90 days supply | Qty: 90 | Fill #1

## 2018-08-30 MED FILL — CLOPIDOGREL 75 MG TABLET: 75 | 90 days supply | Qty: 90 | Fill #0

## 2018-09-16 MED FILL — HUMALOG MIX 50-50 KWIKPEN: (50-50) 100 | 18 days supply | Qty: 18 | Fill #9

## 2018-09-24 MED FILL — COLESEVELAM HCL 625 MG TABS: 625 | 90 days supply | Qty: 180 | Fill #0

## 2018-10-04 MED FILL — SILDENAFIL CITRATE 100 MG T: 100 | 30 days supply | Qty: 6 | Fill #1

## 2018-10-04 MED FILL — METOPROLOL TARTRATE 25 MG T: 25 | 30 days supply | Qty: 60 | Fill #1

## 2018-10-11 DIAGNOSIS — Z794 Long term (current) use of insulin: Secondary | ICD-10-CM | POA: Diagnosis not present

## 2018-10-11 DIAGNOSIS — Z5181 Encounter for therapeutic drug level monitoring: Secondary | ICD-10-CM | POA: Diagnosis not present

## 2018-10-11 DIAGNOSIS — H35033 Hypertensive retinopathy, bilateral: Secondary | ICD-10-CM | POA: Diagnosis not present

## 2018-10-11 DIAGNOSIS — E1165 Type 2 diabetes mellitus with hyperglycemia: Secondary | ICD-10-CM | POA: Diagnosis not present

## 2018-10-11 MED FILL — COLESEVELAM HCL 625 MG TABS: 625 | 90 days supply | Qty: 180 | Fill #0

## 2018-10-13 MED FILL — NOVOLOG MIX 70-30 FLEXPEN S: (70-30) 100 | 90 days supply | Qty: 135 | Fill #0

## 2018-10-21 MED FILL — QUINAPRIL 40 MG TABLET: 40 | 90 days supply | Qty: 90 | Fill #0

## 2018-10-21 MED FILL — EZETIMIBE 10 MG TABS: 10 | 30 days supply | Qty: 30 | Fill #2

## 2018-10-27 ENCOUNTER — Other Ambulatory Visit: Payer: Self-pay | Admitting: Cardiology

## 2018-11-03 DIAGNOSIS — E1351 Other specified diabetes mellitus with diabetic peripheral angiopathy without gangrene: Secondary | ICD-10-CM | POA: Diagnosis not present

## 2018-11-03 DIAGNOSIS — B351 Tinea unguium: Secondary | ICD-10-CM | POA: Diagnosis not present

## 2018-11-03 DIAGNOSIS — M79671 Pain in right foot: Secondary | ICD-10-CM | POA: Diagnosis not present

## 2018-11-03 DIAGNOSIS — G603 Idiopathic progressive neuropathy: Secondary | ICD-10-CM | POA: Diagnosis not present

## 2018-11-03 MED FILL — AMLODIPINE BESYLATE 5 MG TA: 5 | 90 days supply | Qty: 90 | Fill #0

## 2018-11-10 MED FILL — JARDIANCE 10 MG TABLET: 10 | 90 days supply | Qty: 90 | Fill #1

## 2018-11-12 DIAGNOSIS — E785 Hyperlipidemia, unspecified: Secondary | ICD-10-CM | POA: Diagnosis not present

## 2018-11-12 DIAGNOSIS — E119 Type 2 diabetes mellitus without complications: Secondary | ICD-10-CM | POA: Diagnosis not present

## 2018-11-12 DIAGNOSIS — Z0000 Encounter for general adult medical examination without abnormal findings: Secondary | ICD-10-CM | POA: Diagnosis not present

## 2018-11-12 DIAGNOSIS — Z125 Encounter for screening for malignant neoplasm of prostate: Secondary | ICD-10-CM | POA: Diagnosis not present

## 2018-11-12 DIAGNOSIS — I10 Essential (primary) hypertension: Secondary | ICD-10-CM | POA: Diagnosis not present

## 2018-11-19 MED FILL — EZETIMIBE 10 MG TABS: 10 | 90 days supply | Qty: 90 | Fill #0

## 2018-11-22 DIAGNOSIS — E1140 Type 2 diabetes mellitus with diabetic neuropathy, unspecified: Secondary | ICD-10-CM | POA: Diagnosis not present

## 2018-11-22 DIAGNOSIS — M21961 Unspecified acquired deformity of right lower leg: Secondary | ICD-10-CM | POA: Diagnosis not present

## 2018-11-22 DIAGNOSIS — M21962 Unspecified acquired deformity of left lower leg: Secondary | ICD-10-CM | POA: Diagnosis not present

## 2018-11-25 DIAGNOSIS — H40013 Open angle with borderline findings, low risk, bilateral: Secondary | ICD-10-CM | POA: Diagnosis not present

## 2018-12-03 MED FILL — CLOPIDOGREL 75 MG TABLET: 75 | 90 days supply | Qty: 90 | Fill #1

## 2018-12-03 MED FILL — ROSUVASTATIN CALCIUM 40 MG: 40 | 90 days supply | Qty: 90 | Fill #2

## 2018-12-06 MED FILL — METOPROLOL TARTRATE 25 MG T: 25 | 30 days supply | Qty: 60 | Fill #2

## 2018-12-06 MED FILL — SILDENAFIL CITRATE 100 MG T: 100 | 10 days supply | Qty: 10 | Fill #2

## 2018-12-07 DIAGNOSIS — H35033 Hypertensive retinopathy, bilateral: Secondary | ICD-10-CM | POA: Diagnosis not present

## 2018-12-07 DIAGNOSIS — E1165 Type 2 diabetes mellitus with hyperglycemia: Secondary | ICD-10-CM | POA: Diagnosis not present

## 2018-12-07 DIAGNOSIS — Z794 Long term (current) use of insulin: Secondary | ICD-10-CM | POA: Diagnosis not present

## 2018-12-07 DIAGNOSIS — Z5181 Encounter for therapeutic drug level monitoring: Secondary | ICD-10-CM | POA: Diagnosis not present

## 2018-12-07 MED FILL — FREESTYLE LIBRE 14 DAY SENS: 28 days supply | Qty: 2 | Fill #0

## 2018-12-07 MED FILL — FREESTYLE LIBRE 14 DAY READ: 30 days supply | Qty: 1 | Fill #0

## 2018-12-09 ENCOUNTER — Encounter (INDEPENDENT_AMBULATORY_CARE_PROVIDER_SITE_OTHER): Payer: Self-pay

## 2018-12-13 ENCOUNTER — Encounter (INDEPENDENT_AMBULATORY_CARE_PROVIDER_SITE_OTHER): Payer: Self-pay | Admitting: Family Medicine

## 2018-12-13 ENCOUNTER — Ambulatory Visit (INDEPENDENT_AMBULATORY_CARE_PROVIDER_SITE_OTHER): Payer: Federal, State, Local not specified - PPO | Admitting: Family Medicine

## 2018-12-13 VITALS — BP 130/77 | HR 60 | Temp 97.7°F | Ht 69.0 in | Wt 258.0 lb

## 2018-12-13 DIAGNOSIS — R5383 Other fatigue: Secondary | ICD-10-CM | POA: Diagnosis not present

## 2018-12-13 DIAGNOSIS — Z0289 Encounter for other administrative examinations: Secondary | ICD-10-CM

## 2018-12-13 DIAGNOSIS — E1165 Type 2 diabetes mellitus with hyperglycemia: Secondary | ICD-10-CM

## 2018-12-13 DIAGNOSIS — G4733 Obstructive sleep apnea (adult) (pediatric): Secondary | ICD-10-CM | POA: Diagnosis not present

## 2018-12-13 DIAGNOSIS — Z794 Long term (current) use of insulin: Secondary | ICD-10-CM

## 2018-12-13 DIAGNOSIS — Z9189 Other specified personal risk factors, not elsewhere classified: Secondary | ICD-10-CM | POA: Diagnosis not present

## 2018-12-13 DIAGNOSIS — Z1331 Encounter for screening for depression: Secondary | ICD-10-CM | POA: Diagnosis not present

## 2018-12-13 DIAGNOSIS — Z6838 Body mass index (BMI) 38.0-38.9, adult: Secondary | ICD-10-CM

## 2018-12-13 DIAGNOSIS — I10 Essential (primary) hypertension: Secondary | ICD-10-CM | POA: Diagnosis not present

## 2018-12-13 DIAGNOSIS — R0602 Shortness of breath: Secondary | ICD-10-CM | POA: Diagnosis not present

## 2018-12-13 NOTE — Progress Notes (Signed)
.  Office: [phone]  /  Fax: [phone]   HPI:   Chief Complaint: OBESITY  Philip Black (MR# [id number]) is a 63 y.o. male who presents on 12/13/2018 for obesity evaluation and treatment. Current BMI is Body mass index is 38.1 kg/m.Philip Black has struggled with obesity for years and has been unsuccessful in either losing weight or maintaining long term weight loss. Philip Black attended our information session and states he is currently in the action stage of change and ready to dedicate time achieving and maintaining a healthier weight.   Philip Black heard about our clinic from his wife and through [employer]. He often will skip most meals for the entirety of the day.  Philip Black states his desired weight loss is 58 lbs he has been heavy most of  his life he started gaining weight all his life his heaviest weight ever was 350 lbs he has significant food cravings issues  he snacks frequently in the evenings he skips meals frequently he is frequently drinking liquids with calories he frequently makes poor food choices he frequently eats larger portions than normal    Fatigue Philip Black feels his energy is lower than it should be. This has worsened with weight gain and has not worsened recently. Philip Black admits to daytime somnolence and  admits to waking up still tired. Patient has a history of obstructive sleep apnea without the use of CPAP. Patent has a history of symptoms of daytime fatigue. Patient generally gets 5 hours of sleep per night, and states they generally have generally restful sleep. Snoring is present. Apneic episodes are not present. Epworth Sleepiness Score is 17.  Dyspnea on exertion Philip Black notes increasing shortness of breath with exercising and seems to be worsening over time with weight gain. He notes getting out of breath sooner with activity than he used to. This has not gotten worse recently. EKG-ST abnormality, and no change since July 2019. Philip Black denies orthopnea.  Diabetes  II with Hyperglycemia Philip Black has a diagnosis of diabetes type II. Philip Black just started continuous glucose monitoring Friday (3 days ago). He is on 70/30 60 units in the morning and 50 units in the evening. His last eye exam was last week. Last A1c was 10. He denies hypoglycemia. He has been working on intensive lifestyle modifications including diet, exercise, and weight loss to help control his blood glucose levels.  Hypertension Philip Black is a 64 y.o. male with hypertension. Philip Black's blood pressure is controlled. He denies chest pain, chest pressure, or headaches. He is working on weight loss to help control his blood pressure with the goal of decreasing his risk of heart attack and stroke.  Obstructive Sleep Apnea Philip Black was diagnosed years ago with obstructive sleep apnea. He stopped wearing his CPAP secondary to concern over cleanliness of machine.  At risk for cardiovascular disease Philip Black is at a higher than average risk for cardiovascular disease due to obesity, diabetes II, hypertension, and OSA. He currently denies any chest pain.  Depression Screen Philip Black Food and Mood (modified PHQ-9) score was  Depression screen PHQ 2/9 12/13/2018  Decreased Interest 1  Down, Depressed, Hopeless 1  PHQ - 2 Score 2  Altered sleeping 3  Tired, decreased energy 3  Change in appetite 2  Feeling bad or failure about yourself  0  Trouble concentrating 0  Moving slowly or fidgety/restless 0  Suicidal thoughts 0  PHQ-9 Score 10  Difficult doing work/chores Not difficult at all    ASSESSMENT AND PLAN:  Other  fatigue - Plan: EKG 12-Lead, CBC With Differential, VITAMIN D 25 Hydroxy (Vit-D Deficiency, Fractures), Vitamin B12, Folate, T3, T4, free, TSH  Shortness of breath on exertion  Essential hypertension  OSA (obstructive sleep apnea)  Depression screening  At risk for heart disease  Type 2 diabetes mellitus with hyperglycemia, with long-term current use of insulin (HCC) - Plan:  Hemoglobin A1c, Comprehensive metabolic panel, Microalbumin / creatinine urine ratio, C-peptide  Class 2 severe obesity with serious comorbidity and body mass index (BMI) of 38.0 to 38.9 in adult, unspecified obesity type (HCC)  PLAN:  Fatigue [name] was informed that his fatigue may be related to obesity, depression or many other causes. Labs will be ordered, and in the meanwhile Philip Black has agreed to work on diet, exercise and weight loss to help with fatigue. Proper sleep hygiene was discussed including the need for 7-8 hours of quality sleep each night. A sleep study was not ordered based on symptoms and Epworth score.  Dyspnea on exertion Philip Black shortness of breath appears to be obesity related and exercise induced. He has agreed to work on weight loss and gradually increase exercise to treat his exercise induced shortness of breath. If [name] follows our instructions and loses weight without improvement of his shortness of breath, we will plan to refer to pulmonology. We will monitor this condition regularly. Philip Black agrees to this plan.  Diabetes II with Hyperglycemia [name] has been given extensive diabetes education by myself today including ideal fasting and post-prandial blood glucose readings, individual ideal Hgb A1c goals and hypoglycemia prevention. We discussed the importance of good blood sugar control to decrease the likelihood of diabetic complications such as nephropathy, neuropathy, limb loss, blindness, coronary artery disease, and death. We discussed the importance of intensive lifestyle modification including diet, exercise and weight loss as the first line treatment for diabetes. [name] agrees to continue his diabetes medications and we will check Hgb A1c, C-peptide, and Urine MAB today. [name] agrees to follow up with our clinic in 2 weeks.  Hypertension We discussed sodium restriction, working on healthy weight loss, and a regular exercise program as the means to achieve  improved blood pressure control. Philip Black agreed with this plan and agreed to follow up as directed. We will continue to monitor his blood pressure as well as his progress with the above lifestyle modifications. Sheriff agrees to continue his medications as prescribed and will watch for signs of hypotension as he continues his lifestyle modifications. EKG was done, and we will check labs today. Sayvion agrees to follow up with our clinic in 2 weeks.  Obstructive Sleep Apnea Philip Black is to follow up with his sleep doctor, and he agrees to follow up with our clinic in 2 weeks.  Cardiovascular risk counseling Philip Black was given extended (15 minutes) coronary artery disease prevention counseling today. He is 63 y.o. male and has risk factors for heart disease including obesity, diabetes II, hypertension, and OSA. We discussed intensive lifestyle modifications today with an emphasis on specific weight loss instructions and strategies. Pt was also informed of the importance of increasing exercise and decreasing saturated fats to help prevent heart disease.  Depression Screen Philip Black had a moderately positive depression screening. Depression is commonly associated with obesity and often results in emotional eating behaviors. We will monitor this closely and work on CBT to help improve the non-hunger eating patterns. Referral to Psychology may be required if no improvement is seen as he continues in our clinic.  Obesity Philip Black is currently in the  action stage of change and his goal is to continue with weight loss efforts He has agreed to follow the Category 2 plan + 100 calories Philip Black has been instructed to work up to a goal of 150 minutes of combined cardio and strengthening exercise per week for weight loss and overall health benefits. We discussed the following Behavioral Modification Strategies today: increasing lean protein intake, increasing vegetables, work on meal planning and easy cooking plans, better  snacking choices, and planning for success  Philip Black has agreed to follow up with our clinic in 2 weeks. He was informed of the importance of frequent follow up visits to maximize his success with intensive lifestyle modifications for his multiple health conditions. He was informed we would discuss his lab results at his next visit unless there is a critical issue that needs to be addressed sooner. Philip Black agreed to keep his next visit at the agreed upon time to discuss these results.  ALLERGIES: No Known Allergies  MEDICATIONS: Current Outpatient Medications on File Prior to Visit  Medication Sig Dispense Refill  . amLODipine (NORVASC) 5 MG tablet Take 1 tablet (5 mg total) by mouth daily. 90 tablet 0  . aspirin EC 81 MG tablet Take 1 tablet (81 mg total) daily by mouth. 90 tablet 3  . clopidogrel (PLAVIX) 75 MG tablet TAKE 1 TABLET BY MOUTH ONCE DAILY 90 tablet 3  . colesevelam (WELCHOL) 625 MG tablet Take 1,250 mg by mouth daily.    . empagliflozin (JARDIANCE) 10 MG TABS tablet Take 10 mg by mouth daily.    Marland Kitchen ezetimibe (ZETIA) 10 MG tablet Take 10 mg by mouth daily.    . insulin aspart protamine- aspart (NOVOLOG MIX 70/30) (70-30) 100 UNIT/ML injection Inject 50-60 Units into the skin. 60 units in am - 50 units in pm    . Insulin Pen Needle 32G X 4 MM MISC 1 each by Does not apply route daily. 100 each 6  . metoprolol tartrate (LOPRESSOR) 25 MG tablet TAKE 1 TABLET BY MOUTH TWICE DAILY 60 tablet 8  . Multiple Vitamins-Minerals (MENS MULTIPLUS PO) Take by mouth.    . nitroGLYCERIN (NITROSTAT) 0.4 MG SL tablet Place 1 tablet (0.4 mg total) under the tongue every 5 (five) minutes as needed for chest pain (X 3 DOSES MAX). 25 tablet 6  . quinapril (ACCUPRIL) 40 MG tablet Take 40 mg by mouth at bedtime. BLOOD PRESSURE    . sildenafil (VIAGRA) 100 MG tablet Take 1 tablet (100 mg total) by mouth daily as needed for erectile dysfunction. 10 tablet 5  . Vitamin D, Ergocalciferol, (DRISDOL) 50000 units  CAPS capsule Take 1 capsule by mouth 3 (three) times a week.  0  . rosuvastatin (CRESTOR) 40 MG tablet Take 1 tablet (40 mg total) by mouth daily. 90 tablet 3   No current facility-administered medications on file prior to visit.     PAST MEDICAL HISTORY: Past Medical History:  Diagnosis Date  . Arthritis    "shoulders, right hip" (05/07/2016)  . Back pain   . CAD S/P percutaneous coronary angioplasty    a. DES to LAD in 2009 b. cath: 05/2016 w/ patent stent to LAD, 99% stenosis Prox Cx (treated w/ DES), and 99% stenosis of small caliber non-dominant RCA (no intervention performed).  . Carpal tunnel syndrome   . High cholesterol   . Hip pain   . History of heart attack   . Hypertension   . Metabolic syndrome   . OSA (obstructive sleep apnea)    "  used mask; lost weight; stopped using mask" (05/07/2016)  . Tinnitus   . Type II diabetes mellitus ([place])     PAST SURGICAL HISTORY: Past Surgical History:  Procedure Laterality Date  . CARDIAC CATHETERIZATION N/A 05/07/2016   Procedure: Left Heart Cath and Coronary Angiography;  Surgeon: Burnell Blanks, MD;  Location: [facility];  Service: Cardiovascular;  Laterality: N/A;  . CARDIAC CATHETERIZATION N/A 05/07/2016   Procedure: Coronary Stent Intervention;  Surgeon: Burnell Blanks, MD;  Location: [facility];  Service: Cardiovascular;  Laterality: N/A;  . CORONARY ANGIOPLASTY WITH STENT PLACEMENT  02/2008; 05/07/2016  . TONSILLECTOMY  ~ 1969  . WRIST SURGERY      SOCIAL HISTORY: Social History   Tobacco Use  . Smoking status: Never Smoker  . Smokeless tobacco: Never Used  Substance Use Topics  . Alcohol use: No  . Drug use: No    FAMILY HISTORY: Family History  Problem Relation Age of Onset  . Diabetes Mother   . Heart attack Father   . Hyperlipidemia Father   . Hypertension Father   . Heart disease Father     ROS: Review of Systems  Constitutional: Positive for malaise/fatigue. Negative for  weight loss.  HENT: Positive for tinnitus.   Respiratory: Positive for shortness of breath (with exertion).   Cardiovascular: Negative for chest pain and orthopnea.       Negative chest pressure  Musculoskeletal: Positive for back pain.  Neurological: Negative for headaches.  Endo/Heme/Allergies:       Negative hypoglycemia    PHYSICAL EXAM: Blood pressure 130/77, pulse 60, temperature 97.7 F (36.5 C), temperature source Oral, height 5\' 9"  (1.753 m), weight 258 lb (117 kg), SpO2 95 %. Body mass index is 38.1 kg/m. Physical Exam Vitals signs reviewed.  Constitutional:      Appearance: Normal appearance. He is obese.  HENT:     Head: Normocephalic and atraumatic.     Nose: Nose normal.  Eyes:     General: No scleral icterus.    Extraocular Movements: Extraocular movements intact.  Neck:     Musculoskeletal: Normal range of motion and neck supple.     Comments: No thyromegaly present Cardiovascular:     Rate and Rhythm: Normal rate and regular rhythm.     Pulses: Normal pulses.     Heart sounds: Normal heart sounds.  Pulmonary:     Effort: Pulmonary effort is normal. No respiratory distress.     Breath sounds: Normal breath sounds.  Abdominal:     Palpations: Abdomen is soft.     Tenderness: There is no abdominal tenderness.     Comments: + Obesity  Musculoskeletal: Normal range of motion.     Right lower leg: No edema.     Left lower leg: No edema.  Skin:    General: Skin is warm and dry.  Neurological:     Mental Status: He is alert and oriented to person, place, and time.     Coordination: Coordination normal.  Psychiatric:        Mood and Affect: Mood normal.        Behavior: Behavior normal.     RECENT LABS AND TESTS: BMET    Component Value Date/Time   NA 135 05/08/2016 0604   K 3.8 05/08/2016 0604   CL 103 05/08/2016 0604   CO2 24 05/08/2016 0604   GLUCOSE 155 (H) 05/08/2016 0604   BUN 11 05/08/2016 0604   CREATININE 0.95 05/08/2016 0604  CREATININE 1.15 04/30/2016 0938   CALCIUM 9.6 05/08/2016 0604   GFRNONAA >60 05/08/2016 0604   GFRAA >60 05/08/2016 0604   Lab Results  Component Value Date   HGBA1C 11.2% 08/09/2014   No results found for: INSULIN CBC    Component Value Date/Time   WBC 10.0 05/08/2016 0604   RBC 5.02 05/08/2016 0604   HGB 15.2 05/08/2016 0604   HCT 47.1 05/08/2016 0604   PLT 212 05/08/2016 0604   MCV 93.8 05/08/2016 0604   MCH 30.3 05/08/2016 0604   MCHC 32.3 05/08/2016 0604   RDW 13.1 05/08/2016 0604   LYMPHSABS 3,016 04/30/2016 0938   MONOABS 580 04/30/2016 0938   EOSABS 232 04/30/2016 0938   BASOSABS 0 04/30/2016 0938   Iron/TIBC/Ferritin/ %Sat No results found for: IRON, TIBC, FERRITIN, IRONPCTSAT Lipid Panel     Component Value Date/Time   CHOL 152 08/03/2018 0756   TRIG 83 08/03/2018 0756   HDL 66 08/03/2018 0756   CHOLHDL 2.3 08/03/2018 0756   CHOLHDL 2.2 10/24/2013 1152   VLDL 12 10/24/2013 1152   LDLCALC 69 08/03/2018 0756   LDLDIRECT 64 08/08/2009   Hepatic Function Panel     Component Value Date/Time   PROT 7.2 08/03/2018 0756   ALBUMIN 4.4 08/03/2018 0756   AST 30 08/03/2018 0756   ALT 14 08/03/2018 0756   ALKPHOS 51 08/03/2018 0756   BILITOT 0.6 08/03/2018 0756   BILIDIR 0.16 08/03/2018 0756   No results found for: TSH Vitamin D No recent labs  ECG  shows NSR with a rate of 70 BPM INDIRECT CALORIMETER done today shows a VO2 of 242 and a REE of 1687. His calculated basal metabolic rate is 1601 thus his basal metabolic rate is worse than expected.       OBESITY BEHAVIORAL INTERVENTION VISIT  Today's visit was # 1   Starting weight: 258 lbs Starting date: 12/13/2018 Today's weight : 258 lbs  Today's date: 12/13/2018 Total lbs lost to date: 0    12/13/2018  Height 5\' 9"  (1.753 m)  Weight 258 lb (117 kg)  BMI (Calculated) 38.08  BLOOD PRESSURE - SYSTOLIC 093  BLOOD PRESSURE - DIASTOLIC 77  Waist Measurement  46 inches   Body Fat % 40.7 %  Total  Body Water (lbs) 115 lbs  RMR 1687     ASK: We discussed the diagnosis of obesity with Philip Black today and Philip Black agreed to give [place] permission to discuss obesity behavioral modification therapy today.  ASSESS: Lonza has the diagnosis of obesity and his BMI today is 38.08 Ulas is in the action stage of change   ADVISE: Chalmer was educated on the multiple health risks of obesity as well as the benefit of weight loss to improve his health. He was advised of the need for long term treatment and the importance of lifestyle modifications to improve his current health and to decrease his risk of future health problems.  AGREE: Multiple dietary modification options and treatment options were discussed and  Srihan agreed to follow the recommendations documented in the above note.  ARRANGE: Levy was educated on the importance of frequent visits to treat obesity as outlined per CMS and USPSTF guidelines and agreed to schedule his next follow up appointment today.   I, Trixie Dredge, am acting as transcriptionist for Ilene Qua, MD    I have reviewed the above documentation for accuracy and completeness, and I agree with the above. - Ilene Qua, MD

## 2018-12-14 LAB — COMPREHENSIVE METABOLIC PANEL
ALBUMIN: 4.9 g/dL — AB (ref 3.8–4.8)
ALT: 24 IU/L (ref 0–44)
AST: 35 IU/L (ref 0–40)
Albumin/Globulin Ratio: 1.8 (ref 1.2–2.2)
Alkaline Phosphatase: 65 IU/L (ref 39–117)
BUN/Creatinine Ratio: 13 (ref 10–24)
BUN: 14 mg/dL (ref 8–27)
Bilirubin Total: 0.5 mg/dL (ref 0.0–1.2)
CO2: 16 mmol/L — AB (ref 20–29)
CREATININE: 1.07 mg/dL (ref 0.76–1.27)
Calcium: 10 mg/dL (ref 8.6–10.2)
Chloride: 101 mmol/L (ref 96–106)
GFR calc Af Amer: 86 mL/min/{1.73_m2} (ref >59)
GFR calc non Af Amer: 75 mL/min/{1.73_m2} (ref >59)
Globulin, Total: 2.8 g/dL (ref 1.5–4.5)
Glucose: 176 mg/dL — ABNORMAL HIGH (ref 65–99)
Potassium: 4 mmol/L (ref 3.5–5.2)
Sodium: 140 mmol/L (ref 134–144)
Total Protein: 7.7 g/dL (ref 6.0–8.5)

## 2018-12-14 LAB — CBC WITH DIFFERENTIAL
Basophils Absolute: 0 10*3/uL (ref 0.0–0.2)
Basos: 0 %
EOS (ABSOLUTE): 0.2 10*3/uL (ref 0.0–0.4)
Eos: 2 %
HEMOGLOBIN: 15.2 g/dL (ref 13.0–17.7)
Hematocrit: 49.2 % (ref 37.5–51.0)
Immature Grans (Abs): 0 10*3/uL (ref 0.0–0.1)
Immature Granulocytes: 0 %
Lymphocytes Absolute: 3.1 10*3/uL (ref 0.7–3.1)
Lymphs: 25 %
MCH: 29.8 pg (ref 26.6–33.0)
MCHC: 30.9 g/dL — AB (ref 31.5–35.7)
MCV: 97 fL (ref 79–97)
Monocytes Absolute: 0.6 10*3/uL (ref 0.1–0.9)
Monocytes: 5 %
Neutrophils Absolute: 8.4 10*3/uL — ABNORMAL HIGH (ref 1.4–7.0)
Neutrophils: 68 %
RBC: 5.1 x10E6/uL (ref 4.14–5.80)
RDW: 13.8 % (ref 11.6–15.4)
WBC: 12.4 10*3/uL — ABNORMAL HIGH (ref 3.4–10.8)

## 2018-12-14 LAB — FOLATE: FOLATE: 18.6 ng/mL (ref >3.0)

## 2018-12-14 LAB — MICROALBUMIN / CREATININE URINE RATIO
Creatinine, Urine: 75.9 mg/dL
Microalb/Creat Ratio: 70 mg/g creat — ABNORMAL HIGH (ref 0–29)
Microalbumin, Urine: 53.3 ug/mL

## 2018-12-14 LAB — VITAMIN D 25 HYDROXY (VIT D DEFICIENCY, FRACTURES): Vit D, 25-Hydroxy: 16.4 ng/mL — ABNORMAL LOW (ref 30.0–100.0)

## 2018-12-14 LAB — T4, FREE: Free T4: 0.86 ng/dL (ref 0.82–1.77)

## 2018-12-14 LAB — TSH: TSH: 2.99 u[IU]/mL (ref 0.450–4.500)

## 2018-12-14 LAB — C-PEPTIDE: C-Peptide: 1 ng/mL — ABNORMAL LOW (ref 1.1–4.4)

## 2018-12-14 LAB — T3: T3 TOTAL: 103 ng/dL (ref 71–180)

## 2018-12-14 LAB — HEMOGLOBIN A1C
Est. average glucose Bld gHb Est-mCnc: 189 mg/dL
Hgb A1c MFr Bld: 8.2 % — ABNORMAL HIGH (ref 4.8–5.6)

## 2018-12-14 LAB — VITAMIN B12: Vitamin B-12: 452 pg/mL (ref 232–1245)

## 2018-12-16 ENCOUNTER — Encounter (INDEPENDENT_AMBULATORY_CARE_PROVIDER_SITE_OTHER): Payer: Self-pay

## 2018-12-27 ENCOUNTER — Ambulatory Visit (INDEPENDENT_AMBULATORY_CARE_PROVIDER_SITE_OTHER): Payer: Federal, State, Local not specified - PPO | Admitting: Family Medicine

## 2018-12-27 ENCOUNTER — Other Ambulatory Visit: Payer: Self-pay

## 2018-12-27 VITALS — BP 176/94 | HR 75 | Temp 98.4°F | Ht 69.0 in | Wt 260.0 lb

## 2018-12-27 DIAGNOSIS — E559 Vitamin D deficiency, unspecified: Secondary | ICD-10-CM

## 2018-12-27 DIAGNOSIS — I10 Essential (primary) hypertension: Secondary | ICD-10-CM | POA: Diagnosis not present

## 2018-12-27 DIAGNOSIS — Z9189 Other specified personal risk factors, not elsewhere classified: Secondary | ICD-10-CM | POA: Diagnosis not present

## 2018-12-27 DIAGNOSIS — Z794 Long term (current) use of insulin: Secondary | ICD-10-CM

## 2018-12-27 DIAGNOSIS — Z6838 Body mass index (BMI) 38.0-38.9, adult: Secondary | ICD-10-CM

## 2018-12-27 DIAGNOSIS — E1165 Type 2 diabetes mellitus with hyperglycemia: Secondary | ICD-10-CM

## 2018-12-27 MED ORDER — VITAMIN D (ERGOCALCIFEROL) 1.25 MG (50000 UNIT) PO CAPS: 50000.0000 [IU] | ORAL_CAPSULE | ORAL | 0 refills | Status: DC

## 2018-12-28 ENCOUNTER — Encounter (INDEPENDENT_AMBULATORY_CARE_PROVIDER_SITE_OTHER): Payer: Self-pay

## 2018-12-28 MED FILL — VIT D2 1.25 MG (50,000 UNIT: 1.25 MG | 28 days supply | Qty: 4 | Fill #0

## 2018-12-28 NOTE — Progress Notes (Signed)
Office: [phone]  /  Fax: [phone]   HPI:   Chief Complaint: OBESITY Philip Black is here to discuss his progress with his obesity treatment plan. He is on the Category 2 plan + 100 calories and is following his eating plan approximately 0 % of the time. He states he is exercising 0 minutes 0 times per week. Philip Black couldn't follow the plan entirety of the 2 weeks secondary to increased demands at work, so he has had to snack most of the day. He tried to start each day following the plan, but then with increase with stress and demands at work, he hasn't had time to follow the plan.  His weight is 260 lb (117.9 kg) today and has gained 2 pounds since his last visit. He has lost 0 lbs since starting treatment with [place].  Vitamin D Deficiency Philip Black has a diagnosis of vitamin D deficiency. He is currently taking prescription Vit D. He notes fatigue and denies nausea, vomiting or muscle weakness.  At risk for osteopenia and osteoporosis Philip Black is at higher risk of osteopenia and osteoporosis due to vitamin D deficiency.   Diabetes II with Hyperglycemia Philip Black has a diagnosis of diabetes type II. Philip Black states fasting BGs rang in 130's and Hgb A1c is better controlled now. He notes carbohydrate cravings and snacking. He is seeing Dr. Buddy Duty for Endocrinology. His C-peptide is of 1.0. He has been working on intensive lifestyle modifications including diet, exercise, and weight loss to help control his blood glucose levels.   Hypertension [name] is a 63 y.o. male with hypertension. North's blood pressure is uncontrolled today. He is very stressed at work and has increased salt intake. He denies chest pain, chest pressure, or headaches. He is working on weight loss to help control his blood pressure with the goal of decreasing his risk of heart attack and stroke.   ASSESSMENT AND PLAN:  Type 2 diabetes mellitus with hyperglycemia, with long-term current use of insulin (HCC)  Essential  hypertension  Vitamin D deficiency - Plan: Vitamin D, Ergocalciferol, (DRISDOL) 1.25 MG (50000 UT) CAPS capsule  At risk for osteoporosis  Class 2 severe obesity with serious comorbidity and body mass index (BMI) of 38.0 to 38.9 in adult, unspecified obesity type ([name])  PLAN:  Vitamin D Deficiency Philip Black was informed that low vitamin D levels contributes to fatigue and are associated with obesity, breast, and colon cancer. Philip Black agrees to change prescription Vit D to 50,000 IU every week #4 with no refills. He will follow up for routine testing of vitamin D, at least 2-3 times per year. He was informed of the risk of over-replacement of vitamin D and agrees to not increase his dose unless he discusses this with [place] first. Philip Black agrees to follow up with our clinic in 2 weeks.  At risk for osteopenia and osteoporosis Philip Black was given extended (30 minutes) osteoporosis prevention counseling today. Philip Black is at risk for osteopenia and osteoporsis due to his vitamin D deficiency. He was encouraged to take his vitamin D and follow his higher calcium diet and increase strengthening exercise to help strengthen his bones and decrease his risk of osteopenia and osteoporosis.  Diabetes II with Hyperglycemia Philip Black has been given extensive diabetes education by myself today including ideal fasting and post-prandial blood glucose readings, individual ideal Hgb A1c goals and hypoglycemia prevention. We discussed the importance of good blood sugar control to decrease the likelihood of diabetic complications such as nephropathy, neuropathy, limb loss, blindness, coronary artery  disease, and death. We discussed the importance of intensive lifestyle modification including diet, exercise and weight loss as the first line treatment for diabetes. Philip Black agrees to continue his diabetes medications and he is to see Dr. Buddy Duty in May. Philip Black agrees to follow up with our clinic in 2 weeks.  Hypertension We discussed  sodium restriction, working on healthy weight loss, and a regular exercise program as the means to achieve improved blood pressure control. Philip Black agreed with this plan and agreed to follow up as directed. We will continue to monitor his blood pressure as well as his progress with the above lifestyle modifications. Philip Black agrees to continue his current medications and will watch for signs of hypotension as he continues his lifestyle modifications. He is to check his blood pressure 2 to 3 times for 2 weeks to discuss at next appointment. Philip Black agrees to follow up with our clinic in 2 weeks.  Obesity Philip Black is currently in the action stage of change. As such, his goal is to continue with weight loss efforts He has agreed to follow the Category 2 plan + 100 calories [name] has been instructed to work up to a goal of 150 minutes of combined cardio and strengthening exercise per week for weight loss and overall health benefits. We discussed the following Behavioral Modification Strategies today: increasing lean protein intake, increasing vegetables and work on meal planning and easy cooking plans, and planning for success   Philip Black has agreed to follow up with our clinic in 2 weeks. He was informed of the importance of frequent follow up visits to maximize his success with intensive lifestyle modifications for his multiple health conditions.  ALLERGIES: No Known Allergies  MEDICATIONS: Current Outpatient Medications on File Prior to Visit  Medication Sig Dispense Refill  . amLODipine (NORVASC) 5 MG tablet Take 1 tablet (5 mg total) by mouth daily. 90 tablet 0  . aspirin EC 81 MG tablet Take 1 tablet (81 mg total) daily by mouth. 90 tablet 3  . clopidogrel (PLAVIX) 75 MG tablet TAKE 1 TABLET BY MOUTH ONCE DAILY 90 tablet 3  . colesevelam (WELCHOL) 625 MG tablet Take 1,250 mg by mouth daily.    . empagliflozin (JARDIANCE) 10 MG TABS tablet Take 10 mg by mouth daily.    Marland Kitchen ezetimibe (ZETIA) 10 MG  tablet Take 10 mg by mouth daily.    . insulin aspart protamine- aspart (NOVOLOG MIX 70/30) (70-30) 100 UNIT/ML injection Inject 50-60 Units into the skin. 60 units in am - 50 units in pm    . Insulin Pen Needle 32G X 4 MM MISC 1 each by Does not apply route daily. 100 each 6  . metoprolol tartrate (LOPRESSOR) 25 MG tablet TAKE 1 TABLET BY MOUTH TWICE DAILY 60 tablet 8  . Multiple Vitamins-Minerals (MENS MULTIPLUS PO) Take by mouth.    . nitroGLYCERIN (NITROSTAT) 0.4 MG SL tablet Place 1 tablet (0.4 mg total) under the tongue every 5 (five) minutes as needed for chest pain (X 3 DOSES MAX). 25 tablet 6  . quinapril (ACCUPRIL) 40 MG tablet Take 40 mg by mouth at bedtime. BLOOD PRESSURE    . rosuvastatin (CRESTOR) 40 MG tablet Take 1 tablet (40 mg total) by mouth daily. 90 tablet 3  . sildenafil (VIAGRA) 100 MG tablet Take 1 tablet (100 mg total) by mouth daily as needed for erectile dysfunction. 10 tablet 5   No current facility-administered medications on file prior to visit.     PAST MEDICAL HISTORY: Past  Medical History:  Diagnosis Date  . Arthritis    "shoulders, right hip" (05/07/2016)  . Back pain   . CAD S/P percutaneous coronary angioplasty    a. DES to LAD in 2009 b. cath: 05/2016 w/ patent stent to LAD, 99% stenosis Prox Cx (treated w/ DES), and 99% stenosis of small caliber non-dominant RCA (no intervention performed).  . Carpal tunnel syndrome   . High cholesterol   . Hip pain   . History of heart attack   . Hypertension   . Metabolic syndrome   . OSA (obstructive sleep apnea)    "used mask; lost weight; stopped using mask" (05/07/2016)  . Tinnitus   . Type II diabetes mellitus ([place])     PAST SURGICAL HISTORY: Past Surgical History:  Procedure Laterality Date  . CARDIAC CATHETERIZATION N/A 05/07/2016   Procedure: Left Heart Cath and Coronary Angiography;  Surgeon: Burnell Blanks, MD;  Location: [facility];  Service: Cardiovascular;  Laterality: N/A;  .  CARDIAC CATHETERIZATION N/A 05/07/2016   Procedure: Coronary Stent Intervention;  Surgeon: Burnell Blanks, MD;  Location: [facility];  Service: Cardiovascular;  Laterality: N/A;  . CORONARY ANGIOPLASTY WITH STENT PLACEMENT  02/2008; 05/07/2016  . TONSILLECTOMY  ~ 1969  . WRIST SURGERY      SOCIAL HISTORY: Social History   Tobacco Use  . Smoking status: Never Smoker  . Smokeless tobacco: Never Used  Substance Use Topics  . Alcohol use: No  . Drug use: No    FAMILY HISTORY: Family History  Problem Relation Age of Onset  . Diabetes Mother   . Heart attack Father   . Hyperlipidemia Father   . Hypertension Father   . Heart disease Father     ROS: Review of Systems  Constitutional: Positive for malaise/fatigue. Negative for weight loss.  Cardiovascular: Negative for chest pain.       Negative chest pressure  Gastrointestinal: Negative for nausea and vomiting.  Musculoskeletal:       Negative muscle weakness  Neurological: Negative for headaches.  Endo/Heme/Allergies:       Negative hypoglycemia    PHYSICAL EXAM: Blood pressure (!) 176/94, pulse 75, temperature 98.4 F (36.9 C), temperature source Oral, height 5\' 9"  (1.753 m), weight 260 lb (117.9 kg), SpO2 97 %. Body mass index is 38.4 kg/m. Physical Exam Vitals signs reviewed.  Constitutional:      Appearance: Normal appearance. He is obese.  Cardiovascular:     Rate and Rhythm: Normal rate.     Pulses: Normal pulses.  Pulmonary:     Effort: Pulmonary effort is normal.     Breath sounds: Normal breath sounds.  Musculoskeletal: Normal range of motion.  Skin:    General: Skin is warm and dry.  Neurological:     Mental Status: He is alert and oriented to person, place, and time.  Psychiatric:        Mood and Affect: Mood normal.        Behavior: Behavior normal.     RECENT LABS AND TESTS: BMET    Component Value Date/Time   NA 140 12/13/2018 1327   K 4.0 12/13/2018 1327   CL 101 12/13/2018  1327   CO2 16 (L) 12/13/2018 1327   GLUCOSE 176 (H) 12/13/2018 1327   GLUCOSE 155 (H) 05/08/2016 0604   BUN 14 12/13/2018 1327   CREATININE 1.07 12/13/2018 1327   CREATININE 1.15 04/30/2016 0938   CALCIUM 10.0 12/13/2018 1327   GFRNONAA 75 12/13/2018  [place] 12/13/2018 1327   Lab Results  Component Value Date   HGBA1C 8.2 (H) 12/13/2018   HGBA1C 11.2% 08/09/2014   HGBA1C 10.4 04/19/2014   HGBA1C 11.7 10/24/2013   HGBA1C 12.2 04/27/2013   No results found for: INSULIN CBC    Component Value Date/Time   WBC 12.4 (H) 12/13/2018 1327   WBC 10.0 05/08/2016 0604   RBC 5.10 12/13/2018 1327   RBC 5.02 05/08/2016 0604   HGB 15.2 12/13/2018 1327   HCT 49.2 12/13/2018 1327   PLT 212 05/08/2016 0604   MCV 97 12/13/2018 1327   MCH 29.8 12/13/2018 1327   MCH 30.3 05/08/2016 0604   MCHC 30.9 (L) 12/13/2018 1327   MCHC 32.3 05/08/2016 0604   RDW 13.8 12/13/2018 1327   LYMPHSABS 3.1 12/13/2018 1327   MONOABS 580 04/30/2016 0938   EOSABS 0.2 12/13/2018 1327   BASOSABS 0.0 12/13/2018 1327   Iron/TIBC/Ferritin/ %Sat No results found for: IRON, TIBC, FERRITIN, IRONPCTSAT Lipid Panel     Component Value Date/Time   CHOL 152 08/03/2018 0756   TRIG 83 08/03/2018 0756   HDL 66 08/03/2018 0756   CHOLHDL 2.3 08/03/2018 0756   CHOLHDL 2.2 10/24/2013 1152   VLDL 12 10/24/2013 1152   LDLCALC 69 08/03/2018 0756   LDLDIRECT 64 08/08/2009   Hepatic Function Panel     Component Value Date/Time   PROT 7.7 12/13/2018 1327   ALBUMIN 4.9 (H) 12/13/2018 1327   AST 35 12/13/2018 1327   ALT 24 12/13/2018 1327   ALKPHOS 65 12/13/2018 1327   BILITOT 0.5 12/13/2018 1327   BILIDIR 0.16 08/03/2018 0756      Component Value Date/Time   TSH 2.990 12/13/2018 1327      OBESITY BEHAVIORAL INTERVENTION VISIT  Today's visit was # 2   Starting weight: 258 lbs Starting date: 12/13/2018 Today's weight : 260 lbs  Today's date: 12/27/2018 Total lbs lost to date: 0    12/27/2018  Height  5\' 9"  (1.753 m)  Weight 260 lb (117.9 kg)  BMI (Calculated) 38.38  BLOOD PRESSURE - SYSTOLIC 425  BLOOD PRESSURE - DIASTOLIC 94   Body Fat % 95.6 %  Total Body Water (lbs) 111.4 lbs     ASK: We discussed the diagnosis of obesity with Ines Bloomer today and Philip Black agreed to give [place] permission to discuss obesity behavioral modification therapy today.  ASSESS: Jedrek has the diagnosis of obesity and his BMI today is 38.38 Celester is in the action stage of change   ADVISE: Tyrel was educated on the multiple health risks of obesity as well as the benefit of weight loss to improve his health. He was advised of the need for long term treatment and the importance of lifestyle modifications to improve his current health and to decrease his risk of future health problems.  AGREE: Multiple dietary modification options and treatment options were discussed and  Rohith agreed to follow the recommendations documented in the above note.  ARRANGE: Reice was educated on the importance of frequent visits to treat obesity as outlined per CMS and USPSTF guidelines and agreed to schedule his next follow up appointment today.  I, Trixie Dredge, am acting as transcriptionist for Ilene Qua, MD  I have reviewed the above documentation for accuracy and completeness, and I agree with the above. - Ilene Qua, MD

## 2019-01-05 MED FILL — COLESEVELAM HCL 625 MG TABS: 625 | 90 days supply | Qty: 180 | Fill #0

## 2019-01-05 MED FILL — UNIFINE PENTIPS 32GX5/32": 32G X 4 MM | 90 days supply | Qty: 200 | Fill #2

## 2019-01-05 MED FILL — FREESTYLE LIBRE 14 DAY SENS: 28 days supply | Qty: 2 | Fill #1

## 2019-01-05 MED FILL — UNIFINE PENTIPS 32GX5/32: 32G X 4 MM | 90 days supply | Qty: 200 | Fill #2

## 2019-01-11 ENCOUNTER — Encounter (INDEPENDENT_AMBULATORY_CARE_PROVIDER_SITE_OTHER): Payer: Self-pay | Admitting: Family Medicine

## 2019-01-11 ENCOUNTER — Other Ambulatory Visit: Payer: Self-pay

## 2019-01-11 ENCOUNTER — Ambulatory Visit (INDEPENDENT_AMBULATORY_CARE_PROVIDER_SITE_OTHER): Payer: Federal, State, Local not specified - PPO | Admitting: Family Medicine

## 2019-01-11 DIAGNOSIS — I10 Essential (primary) hypertension: Secondary | ICD-10-CM

## 2019-01-11 DIAGNOSIS — Z794 Long term (current) use of insulin: Secondary | ICD-10-CM

## 2019-01-11 DIAGNOSIS — E1165 Type 2 diabetes mellitus with hyperglycemia: Secondary | ICD-10-CM | POA: Diagnosis not present

## 2019-01-11 DIAGNOSIS — Z6838 Body mass index (BMI) 38.0-38.9, adult: Secondary | ICD-10-CM

## 2019-01-11 NOTE — Progress Notes (Signed)
Office: [phone]  /  Fax: [phone] TeleHealth Visit:  Philip Black has verbally consented to this TeleHealth visit today. The patient is located at home, the provider is located at the [employer] [facility] office. The participants in this visit include the listed provider and patient and provider's assistant. The visit was conducted today via face time.  HPI:   Chief Complaint: OBESITY Philip Black is here to discuss his progress with his obesity treatment plan. He is on the Category 2 plan + 100 calories and is following his eating plan approximately 70 % of the time. He states he is exercising 0 minutes 0 times per week. Philip Black has found the past 2 weeks to be slightly easier and is still working at [employer]. He feels used to the meal plan at this time. He is practicing good social distancing and handwashing.  We were unable to weigh the patient today for this TeleHealth visit. He feels as if he has lost 5 lbs since his last visit. He has lost 0 lbs since starting treatment with [place].  Diabetes II with Hyperglycemia Philip Black has a diagnosis of diabetes type II. Philip Black states fasting blood sugars range between 90 and 100's, prior to bedtime blood sugars range about 130's. He had a few hypoglycemic episodes (secondary to not eating). Last A1c was 8.2. He has been working on intensive lifestyle modifications including diet, exercise, and weight loss to help control his blood glucose levels.  Hypertension Philip Black is a 63 y.o. male with hypertension. Philip Black's blood pressure are well controlled per home readings (117/73). He denies chest pain, chest pressure, or headaches. He is working on weight loss to help control his blood pressure with the goal of decreasing his risk of heart attack and stroke.   ASSESSMENT AND PLAN:  Type 2 diabetes mellitus with hyperglycemia, with long-term current use of insulin (HCC)  Essential hypertension  Class 2 severe obesity with serious  comorbidity and body mass index (BMI) of 38.0 to 38.9 in adult, unspecified obesity type ([place])  PLAN:  Diabetes II with Hyperglycemia Philip Black has been given extensive diabetes education by myself today including ideal fasting and post-prandial blood glucose readings, individual ideal Hgb A1c goals and hypoglycemia prevention. We discussed the importance of good blood sugar control to decrease the likelihood of diabetic complications such as nephropathy, neuropathy, limb loss, blindness, coronary artery disease, and death. We discussed the importance of intensive lifestyle modification including diet, exercise and weight loss as the first line treatment for diabetes. Philip Black agrees to continue his current diabetes medications at current doses, but will MyChart if fasting BGs gets to 80's. Philip Black agrees to follow up with our clinic in 2 weeks.   Hypertension We discussed sodium restriction, working on healthy weight loss, and a regular exercise program as the means to achieve improved blood pressure control. Philip Black agreed with this plan and agreed to follow up as directed. We will continue to monitor his blood pressure as well as his progress with the above lifestyle modifications. Philip Black agrees to continue his current medications and will watch for signs of hypotension as he continues his lifestyle modifications. Philip Black agrees to follow up with our clinic in 2 weeks.  Obesity Philip Black is currently in the action stage of change. As such, his goal is to continue with weight loss efforts He has agreed to follow the Category 2 plan Philip Black has been instructed to work up to a goal of 150 minutes of combined cardio and  strengthening exercise per week for weight loss and overall health benefits. We discussed the following Behavioral Modification Strategies today: increasing lean protein intake, increasing vegetables and work on meal planning and easy cooking plans, better snacking choices, and planning for success    Philip Black has agreed to follow up with our clinic in 2 weeks. He was informed of the importance of frequent follow up visits to maximize his success with intensive lifestyle modifications for his multiple health conditions.  ALLERGIES: No Known Allergies  MEDICATIONS: Current Outpatient Medications on File Prior to Visit  Medication Sig Dispense Refill  . amLODipine (NORVASC) 5 MG tablet Take 1 tablet (5 mg total) by mouth daily. 90 tablet 0  . aspirin EC 81 MG tablet Take 1 tablet (81 mg total) daily by mouth. 90 tablet 3  . clopidogrel (PLAVIX) 75 MG tablet TAKE 1 TABLET BY MOUTH ONCE DAILY 90 tablet 3  . colesevelam (WELCHOL) 625 MG tablet Take 1,250 mg by mouth daily.    . empagliflozin (JARDIANCE) 10 MG TABS tablet Take 10 mg by mouth daily.    Marland Kitchen ezetimibe (ZETIA) 10 MG tablet Take 10 mg by mouth daily.    . insulin aspart protamine- aspart (NOVOLOG MIX 70/30) (70-30) 100 UNIT/ML injection Inject 50-60 Units into the skin. 60 units in am - 50 units in pm    . Insulin Pen Needle 32G X 4 MM MISC 1 each by Does not apply route daily. 100 each 6  . metoprolol tartrate (LOPRESSOR) 25 MG tablet TAKE 1 TABLET BY MOUTH TWICE DAILY 60 tablet 8  . Multiple Vitamins-Minerals (MENS MULTIPLUS PO) Take by mouth.    . nitroGLYCERIN (NITROSTAT) 0.4 MG SL tablet Place 1 tablet (0.4 mg total) under the tongue every 5 (five) minutes as needed for chest pain (X 3 DOSES MAX). 25 tablet 6  . quinapril (ACCUPRIL) 40 MG tablet Take 40 mg by mouth at bedtime. BLOOD PRESSURE    . rosuvastatin (CRESTOR) 40 MG tablet Take 1 tablet (40 mg total) by mouth daily. 90 tablet 3  . sildenafil (VIAGRA) 100 MG tablet Take 1 tablet (100 mg total) by mouth daily as needed for erectile dysfunction. 10 tablet 5  . Vitamin D, Ergocalciferol, (DRISDOL) 1.25 MG (50000 UT) CAPS capsule Take 1 capsule (50,000 Units total) by mouth every 7 (seven) days. 4 capsule 0   No current facility-administered medications on file prior to  visit.     PAST MEDICAL HISTORY: Past Medical History:  Diagnosis Date  . Arthritis    "shoulders, right hip" (05/07/2016)  . Back pain   . CAD S/P percutaneous coronary angioplasty    a. DES to LAD in 2009 b. cath: 05/2016 w/ patent stent to LAD, 99% stenosis Prox Cx (treated w/ DES), and 99% stenosis of small caliber non-dominant RCA (no intervention performed).  . Carpal tunnel syndrome   . High cholesterol   . Hip pain   . History of heart attack   . Hypertension   . Metabolic syndrome   . OSA (obstructive sleep apnea)    "used mask; lost weight; stopped using mask" (05/07/2016)  . Tinnitus   . Type II diabetes mellitus ([place])     PAST SURGICAL HISTORY: Past Surgical History:  Procedure Laterality Date  . CARDIAC CATHETERIZATION N/A 05/07/2016   Procedure: Left Heart Cath and Coronary Angiography;  Surgeon: Burnell Blanks, MD;  Location: [facility];  Service: Cardiovascular;  Laterality: N/A;  . CARDIAC CATHETERIZATION N/A 05/07/2016  Procedure: Coronary Stent Intervention;  Surgeon: Burnell Blanks, MD;  Location: [facility];  Service: Cardiovascular;  Laterality: N/A;  . CORONARY ANGIOPLASTY WITH STENT PLACEMENT  02/2008; 05/07/2016  . TONSILLECTOMY  ~ 1969  . WRIST SURGERY      SOCIAL HISTORY: Social History   Tobacco Use  . Smoking status: Never Smoker  . Smokeless tobacco: Never Used  Substance Use Topics  . Alcohol use: No  . Drug use: No    FAMILY HISTORY: Family History  Problem Relation Age of Onset  . Diabetes Mother   . Heart attack Father   . Hyperlipidemia Father   . Hypertension Father   . Heart disease Father     ROS: Review of Systems  Constitutional: Positive for weight loss.  Cardiovascular: Negative for chest pain.       Negative chest pressure  Neurological: Negative for headaches.  Endo/Heme/Allergies:       Negative hypoglycemia    PHYSICAL EXAM: Pt in no acute distress  RECENT LABS AND TESTS: BMET     Component Value Date/Time   NA 140 12/13/2018 1327   K 4.0 12/13/2018 1327   CL 101 12/13/2018 1327   CO2 16 (L) 12/13/2018 1327   GLUCOSE 176 (H) 12/13/2018 1327   GLUCOSE 155 (H) 05/08/2016 0604   BUN 14 12/13/2018 1327   CREATININE 1.07 12/13/2018 1327   CREATININE 1.15 04/30/2016 0938   CALCIUM 10.0 12/13/2018 1327   GFRNONAA 75 12/13/2018 1327   GFRAA 86 12/13/2018 1327   Lab Results  Component Value Date   HGBA1C 8.2 (H) 12/13/2018   HGBA1C 11.2% 08/09/2014   HGBA1C 10.4 04/19/2014   HGBA1C 11.7 10/24/2013   HGBA1C 12.2 04/27/2013   No results found for: INSULIN CBC    Component Value Date/Time   WBC 12.4 (H) 12/13/2018 1327   WBC 10.0 05/08/2016 0604   RBC 5.10 12/13/2018 1327   RBC 5.02 05/08/2016 0604   HGB 15.2 12/13/2018 1327   HCT 49.2 12/13/2018 1327   PLT 212 05/08/2016 0604   MCV 97 12/13/2018 1327   MCH 29.8 12/13/2018 1327   MCH 30.3 05/08/2016 0604   MCHC 30.9 (L) 12/13/2018 1327   MCHC 32.3 05/08/2016 0604   RDW 13.8 12/13/2018 1327   LYMPHSABS 3.1 12/13/2018 1327   MONOABS 580 04/30/2016 0938   EOSABS 0.2 12/13/2018 1327   BASOSABS 0.0 12/13/2018 1327   Iron/TIBC/Ferritin/ %Sat No results found for: IRON, TIBC, FERRITIN, IRONPCTSAT Lipid Panel     Component Value Date/Time   CHOL 152 08/03/2018 0756   TRIG 83 08/03/2018 0756   HDL 66 08/03/2018 0756   CHOLHDL 2.3 08/03/2018 0756   CHOLHDL 2.2 10/24/2013 1152   VLDL 12 10/24/2013 1152   LDLCALC 69 08/03/2018 0756   LDLDIRECT 64 08/08/2009   Hepatic Function Panel     Component Value Date/Time   PROT 7.7 12/13/2018 1327   ALBUMIN 4.9 (H) 12/13/2018 1327   AST 35 12/13/2018 1327   ALT 24 12/13/2018 1327   ALKPHOS 65 12/13/2018 1327   BILITOT 0.5 12/13/2018 1327   BILIDIR 0.16 08/03/2018 0756      Component Value Date/Time   TSH 2.990 12/13/2018 1327      I, Trixie Dredge, am acting as transcriptionist for Ilene Qua, MD  I have reviewed the above documentation for  accuracy and completeness, and I agree with the above. - Ilene Qua, MD

## 2019-01-21 ENCOUNTER — Other Ambulatory Visit: Payer: Self-pay | Admitting: Cardiology

## 2019-01-21 ENCOUNTER — Other Ambulatory Visit (INDEPENDENT_AMBULATORY_CARE_PROVIDER_SITE_OTHER): Payer: Self-pay | Admitting: Family Medicine

## 2019-01-21 DIAGNOSIS — E559 Vitamin D deficiency, unspecified: Secondary | ICD-10-CM

## 2019-01-21 MED FILL — QUINAPRIL 40 MG TABLET: 40 | 90 days supply | Qty: 90 | Fill #0

## 2019-01-21 MED FILL — AMLODIPINE BESYLATE 5 MG TA: 5 | 90 days supply | Qty: 90 | Fill #0

## 2019-01-25 ENCOUNTER — Ambulatory Visit (INDEPENDENT_AMBULATORY_CARE_PROVIDER_SITE_OTHER): Payer: Federal, State, Local not specified - PPO | Admitting: Family Medicine

## 2019-02-01 MED FILL — METOPROLOL TARTRATE 25 MG T: 25 | 30 days supply | Qty: 60 | Fill #3

## 2019-02-01 MED FILL — FREESTYLE LIBRE 14 DAY SENS: 28 days supply | Qty: 2 | Fill #2

## 2019-02-01 MED FILL — SILDENAFIL CITRATE 100 MG T: 100 | 10 days supply | Qty: 10 | Fill #3

## 2019-02-01 MED FILL — JARDIANCE 10 MG TABLET: 10 | 90 days supply | Qty: 90 | Fill #2

## 2019-02-03 ENCOUNTER — Encounter (INDEPENDENT_AMBULATORY_CARE_PROVIDER_SITE_OTHER): Payer: Self-pay | Admitting: Family Medicine

## 2019-02-03 ENCOUNTER — Other Ambulatory Visit: Payer: Self-pay

## 2019-02-03 ENCOUNTER — Ambulatory Visit (INDEPENDENT_AMBULATORY_CARE_PROVIDER_SITE_OTHER): Payer: Federal, State, Local not specified - PPO | Admitting: Family Medicine

## 2019-02-03 DIAGNOSIS — E119 Type 2 diabetes mellitus without complications: Secondary | ICD-10-CM

## 2019-02-03 DIAGNOSIS — Z6838 Body mass index (BMI) 38.0-38.9, adult: Secondary | ICD-10-CM

## 2019-02-03 DIAGNOSIS — E559 Vitamin D deficiency, unspecified: Secondary | ICD-10-CM

## 2019-02-06 NOTE — Progress Notes (Signed)
Office: [phone]  /  Fax: [phone] TeleHealth Visit:  Philip Black has verbally consented to this TeleHealth visit today. The patient is located at home, the provider is located at the [facility] office. The participants in this visit include the listed provider and patient. The visit was conducted today via face time.  HPI:   Chief Complaint: OBESITY Keithen is here to discuss his progress with his obesity treatment plan. He is on the Category 2 plan and is following his eating plan approximately 20 % of the time. He states he is on the treadmill for 20 minutes 3 times per week. Nester weighed in at 258 lbs. He states work has not been busy the past few weeks. His obstacle at night is hunger. He is only adhering to a little of the food throughout the day.  We were unable to weigh the patient today for this TeleHealth visit. He feels as if he has maintained his weight since his last visit. He has lost 0 lbs since starting treatment with [place].  Diabetes II with Hyperglycemia Auren has a diagnosis of diabetes type II. Bascom is still having hypoglycemic episodes secondary to not eating. His BGs normally range between 120 and 179. Last A1c was 8.2. He has been working on intensive lifestyle modifications including diet, exercise, and weight loss to help control his blood glucose levels.  Vitamin D Deficiency Yvon has a diagnosis of vitamin D deficiency. He is currently taking prescription Vit D. He notes fatigue and denies nausea, vomiting or muscle weakness.  ASSESSMENT AND PLAN:  Vitamin D deficiency  Type 2 diabetes mellitus without complication, without long-term current use of insulin (HCC)  Class 2 severe obesity with serious comorbidity and body mass index (BMI) of 38.0 to 38.9 in adult, unspecified obesity type ([place])  PLAN:  Diabetes II with Hyperglycemia Bristol has been given extensive diabetes education by myself today including ideal fasting and  post-prandial blood glucose readings, individual ideal Hgb A1c goals and hypoglycemia prevention. We discussed the importance of good blood sugar control to decrease the likelihood of diabetic complications such as nephropathy, neuropathy, limb loss, blindness, coronary artery disease, and death. We discussed the importance of intensive lifestyle modification including diet, exercise and weight loss as the first line treatment for diabetes. Chai agrees to continue his current diabetes medications, no changes. Auburn agrees to follow up with our clinic in 2 weeks.  Vitamin D Deficiency Challen was informed that low vitamin D levels contributes to fatigue and are associated with obesity, breast, and colon cancer. Jadan agrees to continue taking prescription Vit D @50 ,000 IU every week, no refill needed. He will follow up for routine testing of vitamin D, at least 2-3 times per year. He was informed of the risk of over-replacement of vitamin D and agrees to not increase his dose unless he discusses this with [place] first. Christpher agrees to follow up with our clinic in 2 weeks.  Obesity Cordarius is currently in the action stage of change. As such, his goal is to continue with weight loss efforts He has agreed to follow the Category 2 plan + 100 calories Tru has been instructed to work up to a goal of 150 minutes of combined cardio and strengthening exercise per week or continue the treadmill for 20 minutes 3 days per week for weight loss and overall health benefits. We discussed the following Behavioral Modification Strategies today: increasing lean protein intake and work on meal planning and easy cooking  plans, and no skipping meals   Bell has agreed to follow up with our clinic in 2 weeks. He was informed of the importance of frequent follow up visits to maximize his success with intensive lifestyle modifications for his multiple health conditions.  ALLERGIES: No Known  Allergies  MEDICATIONS: Current Outpatient Medications on File Prior to Visit  Medication Sig Dispense Refill   amLODipine (NORVASC) 5 MG tablet TAKE 1 TABLET BY MOUTH ONCE DAILY 90 tablet 0   aspirin EC 81 MG tablet Take 1 tablet (81 mg total) daily by mouth. 90 tablet 3   clopidogrel (PLAVIX) 75 MG tablet TAKE 1 TABLET BY MOUTH ONCE DAILY 90 tablet 3   colesevelam (WELCHOL) 625 MG tablet Take 1,250 mg by mouth daily.     empagliflozin (JARDIANCE) 10 MG TABS tablet Take 10 mg by mouth daily.     ezetimibe (ZETIA) 10 MG tablet Take 10 mg by mouth daily.     insulin aspart protamine- aspart (NOVOLOG MIX 70/30) (70-30) 100 UNIT/ML injection Inject 50-60 Units into the skin. 60 units in am - 50 units in pm     Insulin Pen Needle 32G X 4 MM MISC 1 each by Does not apply route daily. 100 each 6   metoprolol tartrate (LOPRESSOR) 25 MG tablet TAKE 1 TABLET BY MOUTH TWICE DAILY 60 tablet 8   Multiple Vitamins-Minerals (MENS MULTIPLUS PO) Take by mouth.     nitroGLYCERIN (NITROSTAT) 0.4 MG SL tablet Place 1 tablet (0.4 mg total) under the tongue every 5 (five) minutes as needed for chest pain (X 3 DOSES MAX). 25 tablet 6   quinapril (ACCUPRIL) 40 MG tablet Take 40 mg by mouth at bedtime. BLOOD PRESSURE     rosuvastatin (CRESTOR) 40 MG tablet Take 1 tablet (40 mg total) by mouth daily. 90 tablet 3   sildenafil (VIAGRA) 100 MG tablet Take 1 tablet (100 mg total) by mouth daily as needed for erectile dysfunction. 10 tablet 5   Vitamin D, Ergocalciferol, (DRISDOL) 1.25 MG (50000 UT) CAPS capsule Take 1 capsule (50,000 Units total) by mouth every 7 (seven) days. 4 capsule 0   No current facility-administered medications on file prior to visit.     PAST MEDICAL HISTORY: Past Medical History:  Diagnosis Date   Arthritis    "shoulders, right hip" (05/07/2016)   Back pain    CAD S/P percutaneous coronary angioplasty    a. DES to LAD in 2009 b. cath: 05/2016 w/ patent stent to LAD, 99%  stenosis Prox Cx (treated w/ DES), and 99% stenosis of small caliber non-dominant RCA (no intervention performed).   Carpal tunnel syndrome    High cholesterol    Hip pain    History of heart attack    Hypertension    Metabolic syndrome    OSA (obstructive sleep apnea)    "used mask; lost weight; stopped using mask" (05/07/2016)   Tinnitus    Type II diabetes mellitus ([place])     PAST SURGICAL HISTORY: Past Surgical History:  Procedure Laterality Date   CARDIAC CATHETERIZATION N/A 05/07/2016   Procedure: Left Heart Cath and Coronary Angiography;  Surgeon: Burnell Blanks, MD;  Location: [facility];  Service: Cardiovascular;  Laterality: N/A;   CARDIAC CATHETERIZATION N/A 05/07/2016   Procedure: Coronary Stent Intervention;  Surgeon: Burnell Blanks, MD;  Location: [facility];  Service: Cardiovascular;  Laterality: N/A;   CORONARY ANGIOPLASTY WITH STENT PLACEMENT  02/2008; 05/07/2016   TONSILLECTOMY  ~ 2458  WRIST SURGERY      SOCIAL HISTORY: Social History   Tobacco Use   Smoking status: Never Smoker   Smokeless tobacco: Never Used  Substance Use Topics   Alcohol use: No   Drug use: No    FAMILY HISTORY: Family History  Problem Relation Age of Onset   Diabetes Mother    Heart attack Father    Hyperlipidemia Father    Hypertension Father    Heart disease Father     ROS: Review of Systems  Constitutional: Positive for malaise/fatigue. Negative for weight loss.  Gastrointestinal: Negative for nausea and vomiting.  Musculoskeletal:       Negative muscle weakness  Endo/Heme/Allergies:       Positive hypoglycemia    PHYSICAL EXAM: Pt in no acute distress  RECENT LABS AND TESTS: BMET    Component Value Date/Time   NA 140 12/13/2018 1327   K 4.0 12/13/2018 1327   CL 101 12/13/2018 1327   CO2 16 (L) 12/13/2018 1327   GLUCOSE 176 (H) 12/13/2018 1327   GLUCOSE 155 (H) 05/08/2016 0604   BUN 14 12/13/2018 1327    CREATININE 1.07 12/13/2018 1327   CREATININE 1.15 04/30/2016 0938   CALCIUM 10.0 12/13/2018 1327   GFRNONAA 75 12/13/2018 1327   GFRAA 86 12/13/2018 1327   Lab Results  Component Value Date   HGBA1C 8.2 (H) 12/13/2018   HGBA1C 11.2% 08/09/2014   HGBA1C 10.4 04/19/2014   HGBA1C 11.7 10/24/2013   HGBA1C 12.2 04/27/2013   No results found for: INSULIN CBC    Component Value Date/Time   WBC 12.4 (H) 12/13/2018 1327   WBC 10.0 05/08/2016 0604   RBC 5.10 12/13/2018 1327   RBC 5.02 05/08/2016 0604   HGB 15.2 12/13/2018 1327   HCT 49.2 12/13/2018 1327   PLT 212 05/08/2016 0604   MCV 97 12/13/2018 1327   MCH 29.8 12/13/2018 1327   MCH 30.3 05/08/2016 0604   MCHC 30.9 (L) 12/13/2018 1327   MCHC 32.3 05/08/2016 0604   RDW 13.8 12/13/2018 1327   LYMPHSABS 3.1 12/13/2018 1327   MONOABS 580 04/30/2016 0938   EOSABS 0.2 12/13/2018 1327   BASOSABS 0.0 12/13/2018 1327   Iron/TIBC/Ferritin/ %Sat No results found for: IRON, TIBC, FERRITIN, IRONPCTSAT Lipid Panel     Component Value Date/Time   CHOL 152 08/03/2018 0756   TRIG 83 08/03/2018 0756   HDL 66 08/03/2018 0756   CHOLHDL 2.3 08/03/2018 0756   CHOLHDL 2.2 10/24/2013 1152   VLDL 12 10/24/2013 1152   LDLCALC 69 08/03/2018 0756   LDLDIRECT 64 08/08/2009   Hepatic Function Panel     Component Value Date/Time   PROT 7.7 12/13/2018 1327   ALBUMIN 4.9 (H) 12/13/2018 1327   AST 35 12/13/2018 1327   ALT 24 12/13/2018 1327   ALKPHOS 65 12/13/2018 1327   BILITOT 0.5 12/13/2018 1327   BILIDIR 0.16 08/03/2018 0756      Component Value Date/Time   TSH 2.990 12/13/2018 1327      I, Trixie Dredge, am acting as transcriptionist for Ilene Qua, MD  I have reviewed the above documentation for accuracy and completeness, and I agree with the above. - Ilene Qua, MD

## 2019-02-11 DIAGNOSIS — Z794 Long term (current) use of insulin: Secondary | ICD-10-CM | POA: Diagnosis not present

## 2019-02-11 DIAGNOSIS — Z5181 Encounter for therapeutic drug level monitoring: Secondary | ICD-10-CM | POA: Diagnosis not present

## 2019-02-11 DIAGNOSIS — E1165 Type 2 diabetes mellitus with hyperglycemia: Secondary | ICD-10-CM | POA: Diagnosis not present

## 2019-02-11 DIAGNOSIS — H35033 Hypertensive retinopathy, bilateral: Secondary | ICD-10-CM | POA: Diagnosis not present

## 2019-02-15 MED FILL — EZETIMIBE 10 MG TABS: 10 | 90 days supply | Qty: 90 | Fill #1

## 2019-02-17 ENCOUNTER — Other Ambulatory Visit: Payer: Self-pay

## 2019-02-17 ENCOUNTER — Ambulatory Visit (INDEPENDENT_AMBULATORY_CARE_PROVIDER_SITE_OTHER): Payer: Federal, State, Local not specified - PPO | Admitting: Family Medicine

## 2019-02-17 ENCOUNTER — Encounter (INDEPENDENT_AMBULATORY_CARE_PROVIDER_SITE_OTHER): Payer: Self-pay | Admitting: Family Medicine

## 2019-02-17 DIAGNOSIS — E1165 Type 2 diabetes mellitus with hyperglycemia: Secondary | ICD-10-CM

## 2019-02-17 DIAGNOSIS — I10 Essential (primary) hypertension: Secondary | ICD-10-CM

## 2019-02-17 DIAGNOSIS — Z794 Long term (current) use of insulin: Secondary | ICD-10-CM

## 2019-02-17 DIAGNOSIS — Z6838 Body mass index (BMI) 38.0-38.9, adult: Secondary | ICD-10-CM

## 2019-02-17 MED FILL — NOVOLOG MIX 70-30 FLEXPEN S: (70-30) 100 | 90 days supply | Qty: 135 | Fill #1

## 2019-02-21 NOTE — Progress Notes (Signed)
Office: [phone]  /  Fax: [phone] TeleHealth Visit:  Philip Black has verbally consented to this TeleHealth visit today. The patient is located at home, the provider is located at the [employer] [facility] office. The participants in this visit include the listed provider and patient. The visit was conducted today via face time.  HPI:   Chief Complaint: OBESITY Philip Black is here to discuss his progress with his obesity treatment plan. He is on the Category 2 plan + 100 calories and is following his eating plan approximately 10 % of the time. He states he is exercising 0 minutes 0 times per week. Philip Black's weight at home was 260 lbs yesterday. He is focusing on trying to eat more vegetables. He voices he is trying to follow the plan, but work often prohibits him from eating everything on the plan.  We were unable to weigh the patient today for this TeleHealth visit. He feels as if he has gained 1 lb since his last visit. He has lost 0 lbs since starting treatment with [place].  Diabetes II with Hyperglycemia Philip Black has a diagnosis of diabetes type II. Philip Black states his BGs were 177 this morning. He notes a few hypoglycemic episodes. He is taking 88 units in the morning and 50 units in the evening. Last A1c was 8.2. He has been working on intensive lifestyle modifications including diet, exercise, and weight loss to help control his blood glucose levels.  Hypertension Philip Black is a 63 y.o. male with hypertension. Zafir's blood pressure is controlled at home at 134/90. He denies chest pain, chest pressure, or headaches. He is working on weight loss to help control his blood pressure with the goal of decreasing his risk of heart attack and stroke.   ASSESSMENT AND PLAN:  Type 2 diabetes mellitus with hyperglycemia, with long-term current use of insulin (HCC)  Essential hypertension  Class 2 severe obesity with serious comorbidity and body mass index (BMI) of 38.0 to 38.9 in adult,  unspecified obesity type ([place])  PLAN:  Diabetes II with Hyperglycemia Philip Black has been given extensive diabetes education by myself today including ideal fasting and post-prandial blood glucose readings, individual ideal Hgb A1c goals and hypoglycemia prevention. We discussed the importance of good blood sugar control to decrease the likelihood of diabetic complications such as nephropathy, neuropathy, limb loss, blindness, coronary artery disease, and death. We discussed the importance of intensive lifestyle modification including diet, exercise and weight loss as the first line treatment for diabetes. Einar agrees to decrease his morning dose to 80 units in the morning. Philip Black agrees to follow up with our clinic in 2 weeks.  Hypertension We discussed sodium restriction, working on healthy weight loss, and a regular exercise program as the means to achieve improved blood pressure control. Philip Black agreed with this plan and agreed to follow up as directed. We will continue to monitor his blood pressure as well as his progress with the above lifestyle modifications. Philip Black agrees to continue his current medications at current dose, and will watch for signs of hypotension as he continues his lifestyle modifications. Philip Black agrees to follow up with our clinic in 2 weeks.  Obesity Philip Black is currently in the action stage of change. As such, his goal is to continue with weight loss efforts He has agreed to follow the Category 2 plan Philip Black has been instructed to work up to a goal of 150 minutes of combined cardio and strengthening exercise per week for weight loss and overall  health benefits. We discussed the following Behavioral Modification Strategies today: increasing lean protein intake, increasing vegetables and work on meal planning and easy cooking plans, and planning for success   Philip Black has agreed to follow up with our clinic in 2 weeks. He was informed of the importance of frequent follow up  visits to maximize his success with intensive lifestyle modifications for his multiple health conditions.  ALLERGIES: No Known Allergies  MEDICATIONS: Current Outpatient Medications on File Prior to Visit  Medication Sig Dispense Refill   amLODipine (NORVASC) 5 MG tablet TAKE 1 TABLET BY MOUTH ONCE DAILY 90 tablet 0   aspirin EC 81 MG tablet Take 1 tablet (81 mg total) daily by mouth. 90 tablet 3   clopidogrel (PLAVIX) 75 MG tablet TAKE 1 TABLET BY MOUTH ONCE DAILY 90 tablet 3   colesevelam (WELCHOL) 625 MG tablet Take 1,250 mg by mouth daily.     empagliflozin (JARDIANCE) 10 MG TABS tablet Take 10 mg by mouth daily.     ezetimibe (ZETIA) 10 MG tablet Take 10 mg by mouth daily.     insulin aspart protamine- aspart (NOVOLOG MIX 70/30) (70-30) 100 UNIT/ML injection Inject 50-60 Units into the skin. 60 units in am - 50 units in pm     Insulin Pen Needle 32G X 4 MM MISC 1 each by Does not apply route daily. 100 each 6   metoprolol tartrate (LOPRESSOR) 25 MG tablet TAKE 1 TABLET BY MOUTH TWICE DAILY 60 tablet 8   Multiple Vitamins-Minerals (MENS MULTIPLUS PO) Take by mouth.     nitroGLYCERIN (NITROSTAT) 0.4 MG SL tablet Place 1 tablet (0.4 mg total) under the tongue every 5 (five) minutes as needed for chest pain (X 3 DOSES MAX). 25 tablet 6   quinapril (ACCUPRIL) 40 MG tablet Take 40 mg by mouth at bedtime. BLOOD PRESSURE     rosuvastatin (CRESTOR) 40 MG tablet Take 1 tablet (40 mg total) by mouth daily. 90 tablet 3   sildenafil (VIAGRA) 100 MG tablet Take 1 tablet (100 mg total) by mouth daily as needed for erectile dysfunction. 10 tablet 5   Vitamin D, Ergocalciferol, (DRISDOL) 1.25 MG (50000 UT) CAPS capsule Take 1 capsule (50,000 Units total) by mouth every 7 (seven) days. 4 capsule 0   No current facility-administered medications on file prior to visit.     PAST MEDICAL HISTORY: Past Medical History:  Diagnosis Date   Arthritis    "shoulders, right hip" (05/07/2016)    Back pain    CAD S/P percutaneous coronary angioplasty    a. DES to LAD in 2009 b. cath: 05/2016 w/ patent stent to LAD, 99% stenosis Prox Cx (treated w/ DES), and 99% stenosis of small caliber non-dominant RCA (no intervention performed).   Carpal tunnel syndrome    High cholesterol    Hip pain    History of heart attack    Hypertension    Metabolic syndrome    OSA (obstructive sleep apnea)    "used mask; lost weight; stopped using mask" (05/07/2016)   Tinnitus    Type II diabetes mellitus ([place])     PAST SURGICAL HISTORY: Past Surgical History:  Procedure Laterality Date   CARDIAC CATHETERIZATION N/A 05/07/2016   Procedure: Left Heart Cath and Coronary Angiography;  Surgeon: Burnell Blanks, MD;  Location: [facility] [facility];  Service: Cardiovascular;  Laterality: N/A;   CARDIAC CATHETERIZATION N/A 05/07/2016   Procedure: Coronary Stent Intervention;  Surgeon: Burnell Blanks, MD;  Location: [facility];  Service: Cardiovascular;  Laterality: N/A;   CORONARY ANGIOPLASTY WITH STENT PLACEMENT  02/2008; 05/07/2016   TONSILLECTOMY  ~ 1969   WRIST SURGERY      SOCIAL HISTORY: Social History   Tobacco Use   Smoking status: Never Smoker   Smokeless tobacco: Never Used  Substance Use Topics   Alcohol use: No   Drug use: No    FAMILY HISTORY: Family History  Problem Relation Age of Onset   Diabetes Mother    Heart attack Father    Hyperlipidemia Father    Hypertension Father    Heart disease Father     ROS: Review of Systems  Constitutional: Negative for weight loss.  Cardiovascular: Negative for chest pain.       Negative chest pressure  Neurological: Negative for headaches.  Endo/Heme/Allergies:       Negative hypoglycemia    PHYSICAL EXAM: Pt in no acute distress  RECENT LABS AND TESTS: BMET    Component Value Date/Time   NA 140 12/13/2018 1327   K 4.0 12/13/2018 1327   CL 101 12/13/2018 1327   CO2 16 (L) 12/13/2018  1327   GLUCOSE 176 (H) 12/13/2018 1327   GLUCOSE 155 (H) 05/08/2016 0604   BUN 14 12/13/2018 1327   CREATININE 1.07 12/13/2018 1327   CREATININE 1.15 04/30/2016 0938   CALCIUM 10.0 12/13/2018 1327   GFRNONAA 75 12/13/2018 1327   GFRAA 86 12/13/2018 1327   Lab Results  Component Value Date   HGBA1C 8.2 (H) 12/13/2018   HGBA1C 11.2% 08/09/2014   HGBA1C 10.4 04/19/2014   HGBA1C 11.7 10/24/2013   HGBA1C 12.2 04/27/2013   No results found for: INSULIN CBC    Component Value Date/Time   WBC 12.4 (H) 12/13/2018 1327   WBC 10.0 05/08/2016 0604   RBC 5.10 12/13/2018 1327   RBC 5.02 05/08/2016 0604   HGB 15.2 12/13/2018 1327   HCT 49.2 12/13/2018 1327   PLT 212 05/08/2016 0604   MCV 97 12/13/2018 1327   MCH 29.8 12/13/2018 1327   MCH 30.3 05/08/2016 0604   MCHC 30.9 (L) 12/13/2018 1327   MCHC 32.3 05/08/2016 0604   RDW 13.8 12/13/2018 1327   LYMPHSABS 3.1 12/13/2018 1327   MONOABS 580 04/30/2016 0938   EOSABS 0.2 12/13/2018 1327   BASOSABS 0.0 12/13/2018 1327   Iron/TIBC/Ferritin/ %Sat No results found for: IRON, TIBC, FERRITIN, IRONPCTSAT Lipid Panel     Component Value Date/Time   CHOL 152 08/03/2018 0756   TRIG 83 08/03/2018 0756   HDL 66 08/03/2018 0756   CHOLHDL 2.3 08/03/2018 0756   CHOLHDL 2.2 10/24/2013 1152   VLDL 12 10/24/2013 1152   LDLCALC 69 08/03/2018 0756   LDLDIRECT 64 08/08/2009   Hepatic Function Panel     Component Value Date/Time   PROT 7.7 12/13/2018 1327   ALBUMIN 4.9 (H) 12/13/2018 1327   AST 35 12/13/2018 1327   ALT 24 12/13/2018 1327   ALKPHOS 65 12/13/2018 1327   BILITOT 0.5 12/13/2018 1327   BILIDIR 0.16 08/03/2018 0756      Component Value Date/Time   TSH 2.990 12/13/2018 1327      I, Trixie Dredge, am acting as transcriptionist for Ilene Qua, MD   I have reviewed the above documentation for accuracy and completeness, and I agree with the above. - Ilene Qua, MD

## 2019-03-01 MED FILL — FREESTYLE LIBRE 14 DAY SENS: 28 days supply | Qty: 2 | Fill #3

## 2019-03-03 MED FILL — CLOPIDOGREL 75 MG TABLET: 75 | 90 days supply | Qty: 90 | Fill #2

## 2019-03-03 MED FILL — ROSUVASTATIN CALCIUM 40 MG: 40 | 90 days supply | Qty: 90 | Fill #3

## 2019-03-07 ENCOUNTER — Encounter (INDEPENDENT_AMBULATORY_CARE_PROVIDER_SITE_OTHER): Payer: Self-pay

## 2019-03-07 ENCOUNTER — Ambulatory Visit (INDEPENDENT_AMBULATORY_CARE_PROVIDER_SITE_OTHER): Payer: Federal, State, Local not specified - PPO | Admitting: Family Medicine

## 2019-03-09 ENCOUNTER — Ambulatory Visit (INDEPENDENT_AMBULATORY_CARE_PROVIDER_SITE_OTHER): Payer: Federal, State, Local not specified - PPO | Admitting: Bariatrics

## 2019-03-09 ENCOUNTER — Other Ambulatory Visit: Payer: Self-pay

## 2019-03-09 ENCOUNTER — Encounter (INDEPENDENT_AMBULATORY_CARE_PROVIDER_SITE_OTHER): Payer: Self-pay | Admitting: Bariatrics

## 2019-03-09 DIAGNOSIS — E119 Type 2 diabetes mellitus without complications: Secondary | ICD-10-CM

## 2019-03-09 DIAGNOSIS — Z6838 Body mass index (BMI) 38.0-38.9, adult: Secondary | ICD-10-CM

## 2019-03-09 DIAGNOSIS — Z794 Long term (current) use of insulin: Secondary | ICD-10-CM

## 2019-03-09 DIAGNOSIS — I10 Essential (primary) hypertension: Secondary | ICD-10-CM | POA: Diagnosis not present

## 2019-03-09 NOTE — Progress Notes (Signed)
Office: [phone]  /  Fax: [phone] TeleHealth Visit:  Philip Black has verbally consented to this TeleHealth visit today. The patient is located at home, the provider is located at the [employer] [facility] office. The participants in this visit include the listed provider and patient and any and all parties involved. The visit was conducted today via FaceTime.  HPI:   Chief Complaint: OBESITY Philip Black is here to discuss his progress with his obesity treatment plan. He is on the Category 2 plan and is following his eating plan approximately 50 % of the time. He states he is exercising 0 minutes 0 times per week. [name] states that he has gained weight (weight 264 lbs). He usually sees Dr. Adair Patter. He has been stressed and depressed over the last few weeks. He is snacking during the day. We were unable to weigh the patient today for this TeleHealth visit. He feels as if he has gained weight since his last visit. He has gained 6 lbs since starting treatment with [place].  Hypertension associated with DM Philip Black is a 63 y.o. male with hypertension. He is taking Norvasc and Lopressor.  Philip Black denies chest pain or shortness of breath on exertion. He is working weight loss to help control his blood pressure with the goal of decreasing his risk of heart attack and stroke. [name] blood pressure is controlled at 140/80.  Diabetes II Philip Black has a diagnosis of diabetes type II. She is taking Jardiance and Novolog 70/30 Philip Black states fasting BGs range between 160 and 180's and 2 hour post prandial BGs range between 220 and 230 and she denies any hypoglycemic episodes. Last A1c was at 8.2 He has been working on intensive lifestyle modifications including diet, exercise, and weight loss to help control his blood glucose levels.  ASSESSMENT AND PLAN:  Essential hypertension  Type 2 diabetes mellitus without complication, with long-term current use of insulin (HCC)  Class 2 severe  obesity with serious comorbidity and body mass index (BMI) of 38.0 to 38.9 in adult, unspecified obesity type ([name])  PLAN:  Hypertension associated with DM We discussed sodium restriction, working on healthy weight loss, and a regular exercise program as the means to achieve improved blood pressure control. Philip Black agreed with this plan and agreed to follow up as directed. We will continue to monitor his blood pressure as well as his progress with the above lifestyle modifications. He will continue his medications as prescribed and will watch for signs of hypotension as he continues his lifestyle modifications.  Diabetes II Philip Black has been given extensive diabetes education by myself today including ideal fasting and post-prandial blood glucose readings, individual ideal Hgb A1c goals and hypoglycemia prevention. We discussed the importance of good blood sugar control to decrease the likelihood of diabetic complications such as nephropathy, neuropathy, limb loss, blindness, coronary artery disease, and death. We discussed the importance of intensive lifestyle modification including diet, exercise and weight loss as the first line treatment for diabetes. [name] agrees to continue his diabetes medications and will follow up at the agreed upon time.  Obesity Philip Black is currently in the action stage of change. As such, his goal is to continue with weight loss efforts He has agreed to follow the Category 2 plan [name] will walk on the treadmill for 5 to 10 minutes 2 to 3 times per day for weight loss and overall health benefits. We discussed the following Behavioral Modification Strategies today: planning for success, increase H2O intake,  no skipping meals, keeping healthy foods in the home, increasing lean protein intake, decreasing simple carbohydrates, increasing vegetables, decrease eating out and work on meal planning and easy cooking plans Philip Black will weigh himself at home until he comes into the  office. He will eat consistent meals and protein snack daily.  Philip Black has agreed to follow up with our clinic in 2 weeks. He was informed of the importance of frequent follow up visits to maximize his success with intensive lifestyle modifications for his multiple health conditions.  ALLERGIES: No Known Allergies  MEDICATIONS: Current Outpatient Medications on File Prior to Visit  Medication Sig Dispense Refill  . amLODipine (NORVASC) 5 MG tablet TAKE 1 TABLET BY MOUTH ONCE DAILY 90 tablet 0  . aspirin EC 81 MG tablet Take 1 tablet (81 mg total) daily by mouth. 90 tablet 3  . clopidogrel (PLAVIX) 75 MG tablet TAKE 1 TABLET BY MOUTH ONCE DAILY 90 tablet 3  . colesevelam (WELCHOL) 625 MG tablet Take 1,250 mg by mouth daily.    . empagliflozin (JARDIANCE) 10 MG TABS tablet Take 10 mg by mouth daily.    Marland Kitchen ezetimibe (ZETIA) 10 MG tablet Take 10 mg by mouth daily.    . insulin aspart protamine- aspart (NOVOLOG MIX 70/30) (70-30) 100 UNIT/ML injection Inject 50-60 Units into the skin. 60 units in am - 50 units in pm    . Insulin Pen Needle 32G X 4 MM MISC 1 each by Does not apply route daily. 100 each 6  . metoprolol tartrate (LOPRESSOR) 25 MG tablet TAKE 1 TABLET BY MOUTH TWICE DAILY 60 tablet 8  . Multiple Vitamins-Minerals (MENS MULTIPLUS PO) Take by mouth.    . nitroGLYCERIN (NITROSTAT) 0.4 MG SL tablet Place 1 tablet (0.4 mg total) under the tongue every 5 (five) minutes as needed for chest pain (X 3 DOSES MAX). 25 tablet 6  . quinapril (ACCUPRIL) 40 MG tablet Take 40 mg by mouth at bedtime. BLOOD PRESSURE    . sildenafil (VIAGRA) 100 MG tablet Take 1 tablet (100 mg total) by mouth daily as needed for erectile dysfunction. 10 tablet 5  . Vitamin D, Ergocalciferol, (DRISDOL) 1.25 MG (50000 UT) CAPS capsule Take 1 capsule (50,000 Units total) by mouth every 7 (seven) days. 4 capsule 0  . rosuvastatin (CRESTOR) 40 MG tablet Take 1 tablet (40 mg total) by mouth daily. 90 tablet 3   No current  facility-administered medications on file prior to visit.     PAST MEDICAL HISTORY: Past Medical History:  Diagnosis Date  . Arthritis    "shoulders, right hip" (05/07/2016)  . Back pain   . CAD S/P percutaneous coronary angioplasty    a. DES to LAD in 2009 b. cath: 05/2016 w/ patent stent to LAD, 99% stenosis Prox Cx (treated w/ DES), and 99% stenosis of small caliber non-dominant RCA (no intervention performed).  . Carpal tunnel syndrome   . High cholesterol   . Hip pain   . History of heart attack   . Hypertension   . Metabolic syndrome   . OSA (obstructive sleep apnea)    "used mask; lost weight; stopped using mask" (05/07/2016)  . Tinnitus   . Type II diabetes mellitus ([place])     PAST SURGICAL HISTORY: Past Surgical History:  Procedure Laterality Date  . CARDIAC CATHETERIZATION N/A 05/07/2016   Procedure: Left Heart Cath and Coronary Angiography;  Surgeon: Burnell Blanks, MD;  Location: [facility];  Service: Cardiovascular;  Laterality: N/A;  .  CARDIAC CATHETERIZATION N/A 05/07/2016   Procedure: Coronary Stent Intervention;  Surgeon: Burnell Blanks, MD;  Location: [facility];  Service: Cardiovascular;  Laterality: N/A;  . CORONARY ANGIOPLASTY WITH STENT PLACEMENT  02/2008; 05/07/2016  . TONSILLECTOMY  ~ 1969  . WRIST SURGERY      SOCIAL HISTORY: Social History   Tobacco Use  . Smoking status: Never Smoker  . Smokeless tobacco: Never Used  Substance Use Topics  . Alcohol use: No  . Drug use: No    FAMILY HISTORY: Family History  Problem Relation Age of Onset  . Diabetes Mother   . Heart attack Father   . Hyperlipidemia Father   . Hypertension Father   . Heart disease Father     ROS: Review of Systems  Constitutional: Negative for weight loss.  Respiratory: Negative for shortness of breath (on exertion).   Cardiovascular: Negative for chest pain.  Endo/Heme/Allergies:       Negative for hypoglycemia Positive for hyperglycemia     PHYSICAL EXAM: Pt in no acute distress  RECENT LABS AND TESTS: BMET    Component Value Date/Time   NA 140 12/13/2018 1327   K 4.0 12/13/2018 1327   CL 101 12/13/2018 1327   CO2 16 (L) 12/13/2018 1327   GLUCOSE 176 (H) 12/13/2018 1327   GLUCOSE 155 (H) 05/08/2016 0604   BUN 14 12/13/2018 1327   CREATININE 1.07 12/13/2018 1327   CREATININE 1.15 04/30/2016 0938   CALCIUM 10.0 12/13/2018 1327   GFRNONAA 75 12/13/2018 1327   GFRAA 86 12/13/2018 1327   Lab Results  Component Value Date   HGBA1C 8.2 (H) 12/13/2018   HGBA1C 11.2% 08/09/2014   HGBA1C 10.4 04/19/2014   HGBA1C 11.7 10/24/2013   HGBA1C 12.2 04/27/2013   No results found for: INSULIN CBC    Component Value Date/Time   WBC 12.4 (H) 12/13/2018 1327   WBC 10.0 05/08/2016 0604   RBC 5.10 12/13/2018 1327   RBC 5.02 05/08/2016 0604   HGB 15.2 12/13/2018 1327   HCT 49.2 12/13/2018 1327   PLT 212 05/08/2016 0604   MCV 97 12/13/2018 1327   MCH 29.8 12/13/2018 1327   MCH 30.3 05/08/2016 0604   MCHC 30.9 (L) 12/13/2018 1327   MCHC 32.3 05/08/2016 0604   RDW 13.8 12/13/2018 1327   LYMPHSABS 3.1 12/13/2018 1327   MONOABS 580 04/30/2016 0938   EOSABS 0.2 12/13/2018 1327   BASOSABS 0.0 12/13/2018 1327   Iron/TIBC/Ferritin/ %Sat No results found for: IRON, TIBC, FERRITIN, IRONPCTSAT Lipid Panel     Component Value Date/Time   CHOL 152 08/03/2018 0756   TRIG 83 08/03/2018 0756   HDL 66 08/03/2018 0756   CHOLHDL 2.3 08/03/2018 0756   CHOLHDL 2.2 10/24/2013 1152   VLDL 12 10/24/2013 1152   LDLCALC 69 08/03/2018 0756   LDLDIRECT 64 08/08/2009   Hepatic Function Panel     Component Value Date/Time   PROT 7.7 12/13/2018 1327   ALBUMIN 4.9 (H) 12/13/2018 1327   AST 35 12/13/2018 1327   ALT 24 12/13/2018 1327   ALKPHOS 65 12/13/2018 1327   BILITOT 0.5 12/13/2018 1327   BILIDIR 0.16 08/03/2018 0756      Component Value Date/Time   TSH 2.990 12/13/2018 1327     Ref. Range 12/13/2018 13:27  Vitamin D,  25-Hydroxy Latest Ref Range: 30.0 - 100.0 ng/mL 16.4 (L)    I, Doreene Nest, am acting as Location manager for [employer]. Owens Shark, DO  I have reviewed the above  documentation for accuracy and completeness, and I agree with the above. -Jearld Lesch, DO

## 2019-03-11 IMAGING — MR MR HEAD WO/W CM
13 series · 48 of 48 positions shown · IV contrast (gadobenate)
Comparison: Head CT 01/26/2013

CLINICAL DATA: Left ear tinnitus and dizziness.

Creatinine was obtained on site at [HOSPITAL] at [HOSPITAL].
Results: Creatinine 1.2 mg/dL.
EXAM:
MRI HEAD WITHOUT AND WITH CONTRAST
TECHNIQUE: Multiplanar, multiecho pulse sequences of the brain and surrounding
structures were obtained without and with intravenous contrast.
CONTRAST:  20 mL gadobenate dimeglumine (MULTIHANCE) injection

[Series 5: T1 · sagittal · 4.0mm · 0.75mm/px · 1 of 31 slices shown (1 of 3)]
[im 1/31]
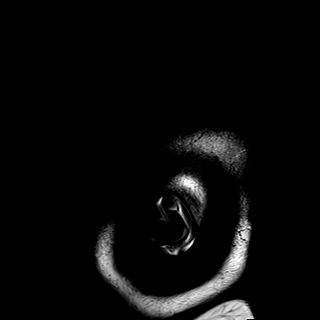

[Series 6: DWI · axial · 3.0mm · 1.50mm/px · z∈[-102,+40]mm · 4 of 88 slices shown (1 of 4)]
[im 1/88]
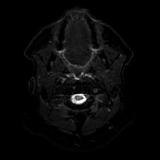
[im 30/88]
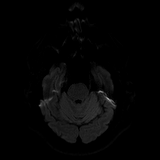
[im 59/88]
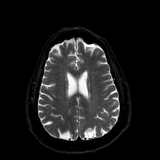
[im 88/88]
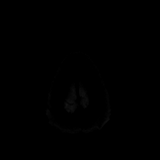

[Series 7: DWI · axial · 3.0mm · 1.50mm/px · z∈[-102,+40]mm · 3 of 44 slices shown (2 of 4)]
[im 1/44]
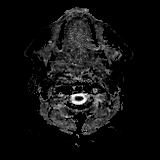
[im 22/44]
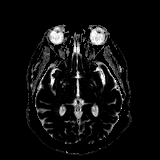
[im 44/44]
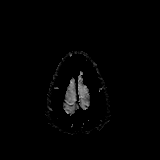

[Series 8: DWI · coronal · 5.0mm · 1.44mm/px · 4 of 60 slices shown (3 of 4)]
[im 1/60]
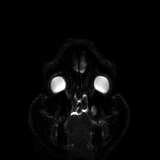
[im 20/60]
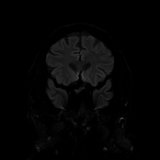
[im 40/60]
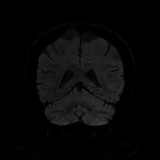
[im 60/60]
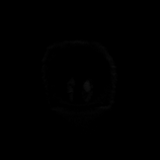

[Series 9: DWI · coronal · 5.0mm · 1.44mm/px · 2 of 30 slices shown (4 of 4)]
[im 1/30]
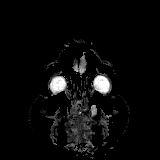
[im 30/30]
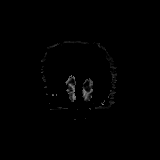

[Series 10: T2 · axial · 4.0mm · 0.38mm/px · z∈[-105,+41]mm · 2 of 29 slices shown]
[im 1/29]
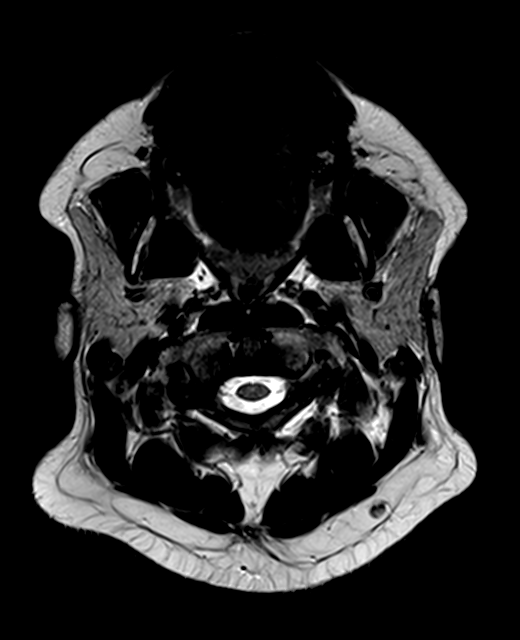
[im 29/29]
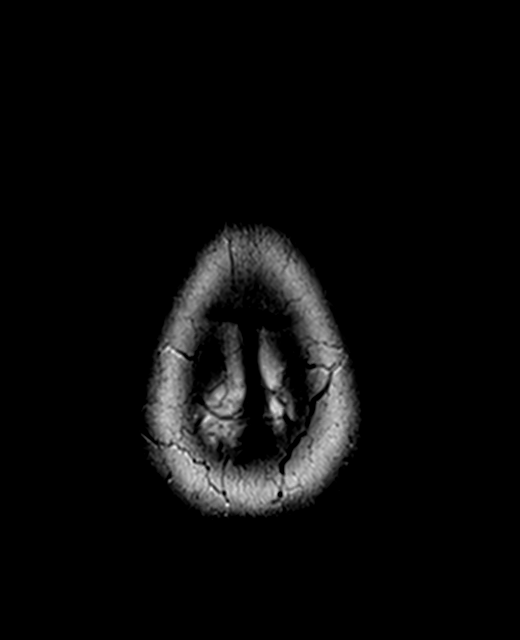

[Series 11: FLAIR · axial · 3.0mm · 0.75mm/px · z∈[-106,+44]mm · 2 of 26 slices shown (1 of 2)]
[im 1/26]
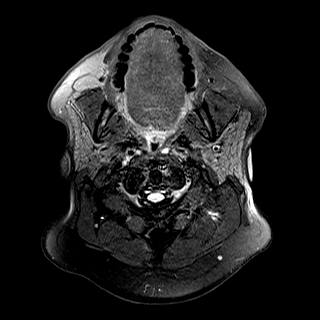
[im 26/26]
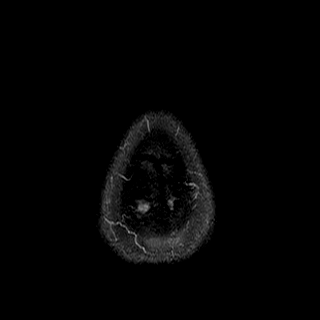

[Series 13: swi_images · axial · 1.5mm · 0.94mm/px · z∈[-103,+39]mm · 6 of 96 slices shown]
[im 1/96]
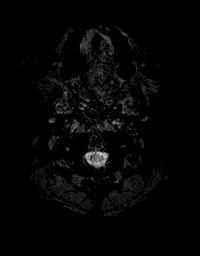
[im 20/96]
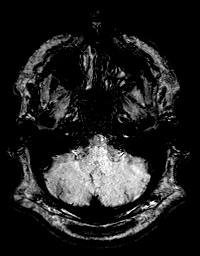
[im 39/96]
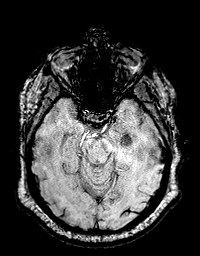
[im 58/96]
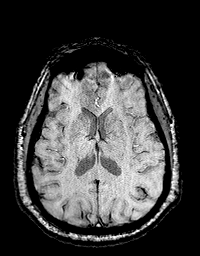
[im 77/96]
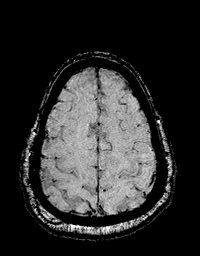
[im 96/96]
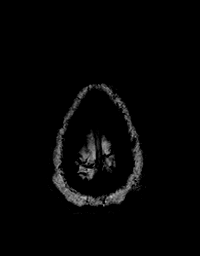

[Series 14: T1 · axial · 1.0mm · 0.94mm/px · z∈[-104,+39]mm · 9 of 144 slices shown (2 of 3)]
[im 1/144]
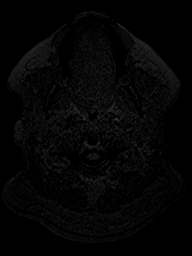
[im 18/144]
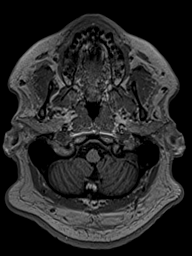
[im 36/144]
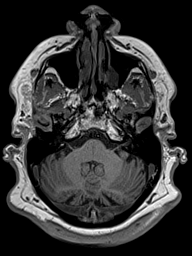
[im 54/144]
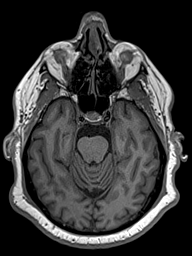
[im 72/144]
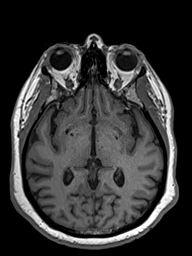
[im 90/144]
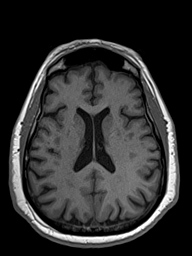
[im 108/144]
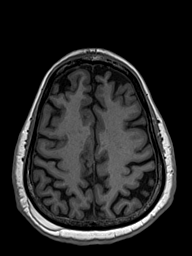
[im 126/144]
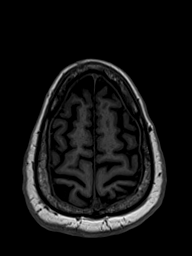
[im 144/144]
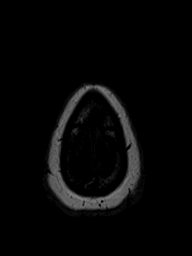

[Series 15: FLAIR · sagittal · 4.0mm · 0.72mm/px · 2 of 26 slices shown (2 of 2)]
[im 1/26]
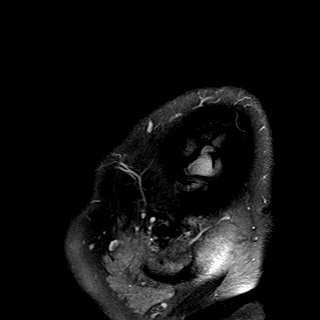
[im 26/26]
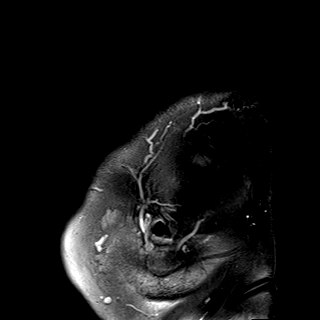

[Series 16: T2 post-contrast · coronal · 4.0mm · 0.36mm/px · 2 of 34 slices shown]
[im 1/34]
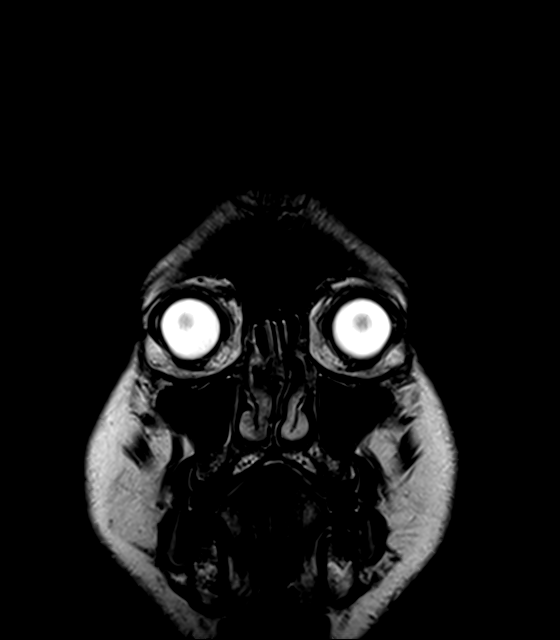
[im 34/34]
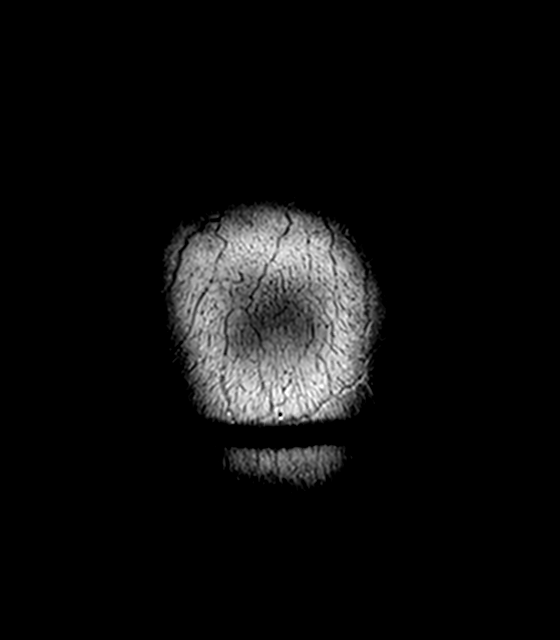

[Series 17: T1 · axial · 1.0mm · 0.94mm/px · z∈[-104,+39]mm · 9 of 144 slices shown (3 of 3)]
[im 1/144]
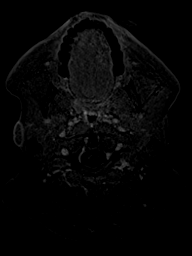
[im 18/144]
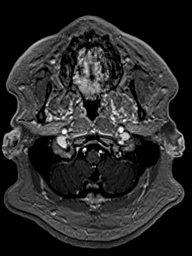
[im 36/144]
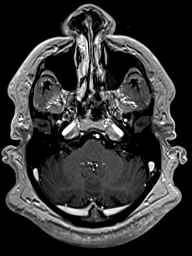
[im 54/144]
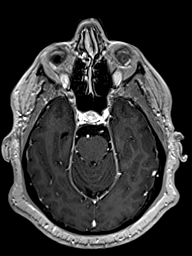
[im 72/144]
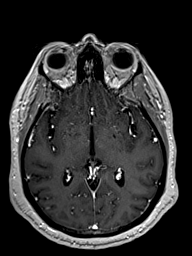
[im 90/144]
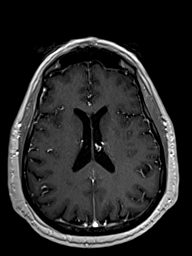
[im 108/144]
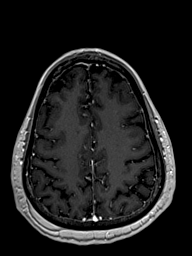
[im 126/144]
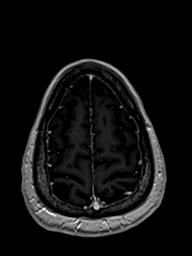
[im 144/144]
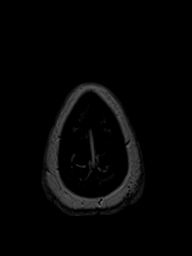

[Series 18: T1 post-contrast · coronal · 4.0mm · 0.72mm/px · 2 of 34 slices shown]
[im 1/34]
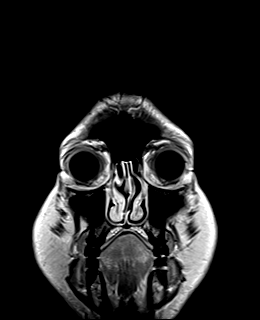
[im 34/34]
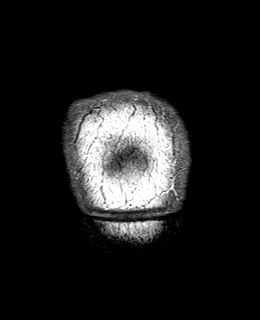

[48 of 48 positions shown; findings below may reference images not displayed]

FINDINGS: Brain: The midline structures are normal. There is no acute infarct
or acute hemorrhage. No mass lesion, hydrocephalus, dural
abnormality or extra-axial collection. Minimal white matter
hyperintensity, nonspecific and commonly seen in asymptomatic
patients of this age. No age-advanced or lobar predominant atrophy.
No chronic microhemorrhage or superficial siderosis. No abnormal
contrast enhancement within the internal auditory canals.

Vascular: Major intracranial arterial and venous sinus flow voids
are preserved. Normal course of the internal carotid arteries at the
skull base. No high riding jugular venous bulb.

Skull and upper cervical spine: Right parietal scalp lipoma.

Sinuses/Orbits: No fluid levels or advanced mucosal thickening. No
mastoid or middle ear effusion. Normal orbits.
IMPRESSION: 1. Normal aging brain.
2. No abnormal internal auditory canal contrast enhancement. No
vascular abnormality that would explain the reported tinnitus.

## 2019-03-22 ENCOUNTER — Other Ambulatory Visit: Payer: Self-pay

## 2019-03-22 ENCOUNTER — Ambulatory Visit (INDEPENDENT_AMBULATORY_CARE_PROVIDER_SITE_OTHER): Payer: Federal, State, Local not specified - PPO | Admitting: Bariatrics

## 2019-03-22 ENCOUNTER — Encounter (INDEPENDENT_AMBULATORY_CARE_PROVIDER_SITE_OTHER): Payer: Self-pay | Admitting: Bariatrics

## 2019-03-22 DIAGNOSIS — Z6838 Body mass index (BMI) 38.0-38.9, adult: Secondary | ICD-10-CM

## 2019-03-22 DIAGNOSIS — E119 Type 2 diabetes mellitus without complications: Secondary | ICD-10-CM

## 2019-03-22 DIAGNOSIS — I10 Essential (primary) hypertension: Secondary | ICD-10-CM | POA: Diagnosis not present

## 2019-03-22 DIAGNOSIS — Z794 Long term (current) use of insulin: Secondary | ICD-10-CM

## 2019-03-22 MED FILL — FREESTYLE LIBRE 14 DAY SENS: 28 days supply | Qty: 2 | Fill #4

## 2019-03-23 MED FILL — SILDENAFIL CITRATE 100 MG T: 100 | 10 days supply | Qty: 10 | Fill #4

## 2019-03-23 NOTE — Progress Notes (Signed)
Office: [phone]  /  Fax: [phone] TeleHealth Visit:  THAER MIYOSHI has verbally consented to this TeleHealth visit today. The patient is located at home, the provider is located at the [employer] [facility] office. The participants in this visit include the listed provider and patient and any and all parties involved. The visit was conducted today via FaceTime.  HPI:   Chief Complaint: OBESITY Kariem is here to discuss his progress with his obesity treatment plan. He is on the Category 2 plan and is following his eating plan approximately 75 % of the time. He states he is exercising 0 minutes 0 times per week. Edwin states that he has lost 2 pounds (weight 260 lbs) today. He cut back on his portions. He is getting adequate protein and water. We were unable to weigh the patient today for this TeleHealth visit. He feels as if he has lost weight since his last visit. He has gained 2 lbs since starting treatment with [place].  Hypertension SHLOK RAZ is a 63 y.o. male with hypertension. [name] SPEIR denies lightheadedness. He is working weight loss to help control his blood pressure with the goal of decreasing his risk of heart attack and stroke. [name] blood pressure is controlled at 147/67.  Diabetes II Shown has a diagnosis of diabetes type II. Yitzchak states fasting BGs are in the 140's and he has had occasional lows below 70 any hypoglycemic episodes. He is taking Jardiance and Novolog 70/30 Last A1c was at 8.2 He has been working on intensive lifestyle modifications including diet, exercise, and weight loss to help control his blood glucose levels.  ASSESSMENT AND PLAN:  Essential hypertension  Type 2 diabetes mellitus without complication, with long-term current use of insulin (HCC)  Class 2 severe obesity with serious comorbidity and body mass index (BMI) of 38.0 to 38.9 in adult, unspecified obesity type ([place])  PLAN:  Hypertension We discussed sodium restriction,  working on healthy weight loss, and a regular exercise program as the means to achieve improved blood pressure control. [name] agreed with this plan and agreed to follow up as directed. We will continue to monitor his blood pressure as well as his progress with the above lifestyle modifications. He will continue his medications as prescribed and will watch for signs of hypotension as he continues his lifestyle modifications.  Diabetes II Asberry has been given extensive diabetes education by myself today including ideal fasting and post-prandial blood glucose readings, individual ideal Hgb A1c goals and hypoglycemia prevention. We discussed the importance of good blood sugar control to decrease the likelihood of diabetic complications such as nephropathy, neuropathy, limb loss, blindness, coronary artery disease, and death. We discussed the importance of intensive lifestyle modification including diet, exercise and weight loss as the first line treatment for diabetes. Naren agrees to continue his diabetes medications and glucose tablets. He will follow up at the agreed upon time. Handout sheets for hypoglycemia were given to patient today.  Obesity Najeeb is currently in the action stage of change. As such, his goal is to continue with weight loss efforts He has agreed to follow the Category 2 plan Deontrey has been instructed to work up to a goal of 150 minutes of combined cardio and strengthening exercise per week for weight loss and overall health benefits. We discussed the following Behavioral Modification Strategies today: planning for success, increase H2O intake, no skipping meals, keeping healthy foods in the home, better snacking choices, increasing lean protein intake, decreasing simple  carbohydrates, increasing vegetables, decrease eating out, work on meal planning and easy cooking plans, emotional eating strategies and ways to avoid night time snacking Jojuan will continue to weigh himself at  home until he returns to the office. He will increase protein to 80-90 grams.  Haim has agreed to follow up with our clinic in 2 weeks. He was informed of the importance of frequent follow up visits to maximize his success with intensive lifestyle modifications for his multiple health conditions.  ALLERGIES: No Known Allergies  MEDICATIONS: Current Outpatient Medications on File Prior to Visit  Medication Sig Dispense Refill   amLODipine (NORVASC) 5 MG tablet TAKE 1 TABLET BY MOUTH ONCE DAILY 90 tablet 0   aspirin EC 81 MG tablet Take 1 tablet (81 mg total) daily by mouth. 90 tablet 3   clopidogrel (PLAVIX) 75 MG tablet TAKE 1 TABLET BY MOUTH ONCE DAILY 90 tablet 3   colesevelam (WELCHOL) 625 MG tablet Take 1,250 mg by mouth daily.     empagliflozin (JARDIANCE) 10 MG TABS tablet Take 10 mg by mouth daily.     ezetimibe (ZETIA) 10 MG tablet Take 10 mg by mouth daily.     insulin aspart protamine- aspart (NOVOLOG MIX 70/30) (70-30) 100 UNIT/ML injection Inject 50-60 Units into the skin. 60 units in am - 50 units in pm     Insulin Pen Needle 32G X 4 MM MISC 1 each by Does not apply route daily. 100 each 6   metoprolol tartrate (LOPRESSOR) 25 MG tablet TAKE 1 TABLET BY MOUTH TWICE DAILY 60 tablet 8   Multiple Vitamins-Minerals (MENS MULTIPLUS PO) Take by mouth.     nitroGLYCERIN (NITROSTAT) 0.4 MG SL tablet Place 1 tablet (0.4 mg total) under the tongue every 5 (five) minutes as needed for chest pain (X 3 DOSES MAX). 25 tablet 6   quinapril (ACCUPRIL) 40 MG tablet Take 40 mg by mouth at bedtime. BLOOD PRESSURE     rosuvastatin (CRESTOR) 40 MG tablet Take 1 tablet (40 mg total) by mouth daily. 90 tablet 3   sildenafil (VIAGRA) 100 MG tablet Take 1 tablet (100 mg total) by mouth daily as needed for erectile dysfunction. 10 tablet 5   Vitamin D, Ergocalciferol, (DRISDOL) 1.25 MG (50000 UT) CAPS capsule Take 1 capsule (50,000 Units total) by mouth every 7 (seven) days. 4 capsule 0    No current facility-administered medications on file prior to visit.     PAST MEDICAL HISTORY: Past Medical History:  Diagnosis Date   Arthritis    "shoulders, right hip" (05/07/2016)   Back pain    CAD S/P percutaneous coronary angioplasty    a. DES to LAD in 2009 b. cath: 05/2016 w/ patent stent to LAD, 99% stenosis Prox Cx (treated w/ DES), and 99% stenosis of small caliber non-dominant RCA (no intervention performed).   Carpal tunnel syndrome    High cholesterol    Hip pain    History of heart attack    Hypertension    Metabolic syndrome    OSA (obstructive sleep apnea)    "used mask; lost weight; stopped using mask" (05/07/2016)   Tinnitus    Type II diabetes mellitus ([place])     PAST SURGICAL HISTORY: Past Surgical History:  Procedure Laterality Date   CARDIAC CATHETERIZATION N/A 05/07/2016   Procedure: Left Heart Cath and Coronary Angiography;  Surgeon: Burnell Blanks, MD;  Location: [facility];  Service: Cardiovascular;  Laterality: N/A;   CARDIAC CATHETERIZATION N/A 05/07/2016  Procedure: Coronary Stent Intervention;  Surgeon: Burnell Blanks, MD;  Location: [facility];  Service: Cardiovascular;  Laterality: N/A;   CORONARY ANGIOPLASTY WITH STENT PLACEMENT  02/2008; 05/07/2016   TONSILLECTOMY  ~ 1969   WRIST SURGERY      SOCIAL HISTORY: Social History   Tobacco Use   Smoking status: Never Smoker   Smokeless tobacco: Never Used  Substance Use Topics   Alcohol use: No   Drug use: No    FAMILY HISTORY: Family History  Problem Relation Age of Onset   Diabetes Mother    Heart attack Father    Hyperlipidemia Father    Hypertension Father    Heart disease Father     ROS: Review of Systems  Constitutional: Positive for weight loss.  Respiratory: Shortness of breath: on exertion.   Neurological:       Negative for lightheadedness  Endo/Heme/Allergies:       Positive for hypoglycemia    PHYSICAL  EXAM: Pt in no acute distress  RECENT LABS AND TESTS: BMET    Component Value Date/Time   NA 140 12/13/2018 1327   K 4.0 12/13/2018 1327   CL 101 12/13/2018 1327   CO2 16 (L) 12/13/2018 1327   GLUCOSE 176 (H) 12/13/2018 1327   GLUCOSE 155 (H) 05/08/2016 0604   BUN 14 12/13/2018 1327   CREATININE 1.07 12/13/2018 1327   CREATININE 1.15 04/30/2016 0938   CALCIUM 10.0 12/13/2018 1327   GFRNONAA 75 12/13/2018 1327   GFRAA 86 12/13/2018 1327   Lab Results  Component Value Date   HGBA1C 8.2 (H) 12/13/2018   HGBA1C 11.2% 08/09/2014   HGBA1C 10.4 04/19/2014   HGBA1C 11.7 10/24/2013   HGBA1C 12.2 04/27/2013   No results found for: INSULIN CBC    Component Value Date/Time   WBC 12.4 (H) 12/13/2018 1327   WBC 10.0 05/08/2016 0604   RBC 5.10 12/13/2018 1327   RBC 5.02 05/08/2016 0604   HGB 15.2 12/13/2018 1327   HCT 49.2 12/13/2018 1327   PLT 212 05/08/2016 0604   MCV 97 12/13/2018 1327   MCH 29.8 12/13/2018 1327   MCH 30.3 05/08/2016 0604   MCHC 30.9 (L) 12/13/2018 1327   MCHC 32.3 05/08/2016 0604   RDW 13.8 12/13/2018 1327   LYMPHSABS 3.1 12/13/2018 1327   MONOABS 580 04/30/2016 0938   EOSABS 0.2 12/13/2018 1327   BASOSABS 0.0 12/13/2018 1327   Iron/TIBC/Ferritin/ %Sat No results found for: IRON, TIBC, FERRITIN, IRONPCTSAT Lipid Panel     Component Value Date/Time   CHOL 152 08/03/2018 0756   TRIG 83 08/03/2018 0756   HDL 66 08/03/2018 0756   CHOLHDL 2.3 08/03/2018 0756   CHOLHDL 2.2 10/24/2013 1152   VLDL 12 10/24/2013 1152   LDLCALC 69 08/03/2018 0756   LDLDIRECT 64 08/08/2009   Hepatic Function Panel     Component Value Date/Time   PROT 7.7 12/13/2018 1327   ALBUMIN 4.9 (H) 12/13/2018 1327   AST 35 12/13/2018 1327   ALT 24 12/13/2018 1327   ALKPHOS 65 12/13/2018 1327   BILITOT 0.5 12/13/2018 1327   BILIDIR 0.16 08/03/2018 0756      Component Value Date/Time   TSH 2.990 12/13/2018 1327     Ref. Range 12/13/2018 13:27  Vitamin D, 25-Hydroxy Latest  Ref Range: 30.0 - 100.0 ng/mL 16.4 (L)    I, Doreene Nest, am acting as Location manager for [employer]. Owens Shark, DO  I have reviewed the above documentation for accuracy and completeness, and  I agree with the above. -Jearld Lesch, DO

## 2019-04-06 ENCOUNTER — Ambulatory Visit (INDEPENDENT_AMBULATORY_CARE_PROVIDER_SITE_OTHER): Payer: Federal, State, Local not specified - PPO | Admitting: Bariatrics

## 2019-04-07 DIAGNOSIS — Z5181 Encounter for therapeutic drug level monitoring: Secondary | ICD-10-CM | POA: Diagnosis not present

## 2019-04-07 DIAGNOSIS — E1165 Type 2 diabetes mellitus with hyperglycemia: Secondary | ICD-10-CM | POA: Diagnosis not present

## 2019-04-07 DIAGNOSIS — Z794 Long term (current) use of insulin: Secondary | ICD-10-CM | POA: Diagnosis not present

## 2019-04-07 DIAGNOSIS — H35033 Hypertensive retinopathy, bilateral: Secondary | ICD-10-CM | POA: Diagnosis not present

## 2019-04-12 MED FILL — METOPROLOL TARTRATE 25 MG T: 25 | 30 days supply | Qty: 60 | Fill #4

## 2019-04-12 MED FILL — COLESEVELAM HCL 625 MG TABS: 625 | 90 days supply | Qty: 180 | Fill #0

## 2019-04-20 ENCOUNTER — Other Ambulatory Visit: Payer: Self-pay | Admitting: Cardiology

## 2019-04-20 MED FILL — QUINAPRIL 40 MG TABLET: 40 | 90 days supply | Qty: 90 | Fill #0

## 2019-04-20 MED FILL — AMLODIPINE BESYLATE 5 MG TA: 5 | 30 days supply | Qty: 30 | Fill #0

## 2019-04-20 MED FILL — FREESTYLE LIBRE 14 DAY SENS: 28 days supply | Qty: 2 | Fill #5

## 2019-05-03 MED FILL — UNIFINE PENTIPS 32GX5/32: 32G X 4 MM | 90 days supply | Qty: 200 | Fill #0

## 2019-05-06 DIAGNOSIS — I2510 Atherosclerotic heart disease of native coronary artery without angina pectoris: Secondary | ICD-10-CM | POA: Diagnosis not present

## 2019-05-06 DIAGNOSIS — E785 Hyperlipidemia, unspecified: Secondary | ICD-10-CM | POA: Diagnosis not present

## 2019-05-06 DIAGNOSIS — E119 Type 2 diabetes mellitus without complications: Secondary | ICD-10-CM | POA: Diagnosis not present

## 2019-05-06 DIAGNOSIS — I10 Essential (primary) hypertension: Secondary | ICD-10-CM | POA: Diagnosis not present

## 2019-05-10 ENCOUNTER — Other Ambulatory Visit: Payer: Self-pay | Admitting: Cardiology

## 2019-05-10 MED FILL — EZETIMIBE 10 MG TABS: 10 | 90 days supply | Qty: 90 | Fill #2

## 2019-05-10 MED FILL — JARDIANCE 10 MG TABLET: 10 | 90 days supply | Qty: 90 | Fill #3

## 2019-05-11 NOTE — Telephone Encounter (Signed)
Please review for refill [facility]. Patient.

## 2019-05-17 MED FILL — FREESTYLE LIBRE 14 DAY SENS: 28 days supply | Qty: 2 | Fill #6

## 2019-05-26 ENCOUNTER — Other Ambulatory Visit: Payer: Self-pay | Admitting: Cardiovascular Disease

## 2019-05-26 MED ORDER — AMLODIPINE BESYLATE 5 MG PO TABS: 5.0000 mg | ORAL_TABLET | Freq: Every day | ORAL | 1 refills | Status: DC

## 2019-05-26 MED FILL — AMLODIPINE BESYLATE 5 MG TA: 5 | 15 days supply | Qty: 15 | Fill #0

## 2019-06-14 ENCOUNTER — Other Ambulatory Visit: Payer: Self-pay | Admitting: Cardiology

## 2019-06-14 MED ORDER — AMLODIPINE BESYLATE 5 MG PO TABS: 5.0000 mg | ORAL_TABLET | Freq: Every day | ORAL | 0 refills | Status: DC

## 2019-06-14 MED FILL — ROSUVASTATIN CALCIUM 40 MG: 40 | 90 days supply | Qty: 90 | Fill #0

## 2019-06-14 MED FILL — AMLODIPINE BESYLATE 5 MG TA: 5 | 30 days supply | Qty: 30 | Fill #0

## 2019-06-14 MED FILL — METOPROLOL TARTRATE 25 MG T: 25 | 30 days supply | Qty: 60 | Fill #5

## 2019-06-14 MED FILL — CLOPIDOGREL 75 MG TABLET: 75 | 90 days supply | Qty: 90 | Fill #3

## 2019-06-14 MED FILL — FREESTYLE LIBRE 14 DAY SENS: 28 days supply | Qty: 2 | Fill #7

## 2019-06-14 MED FILL — SILDENAFIL CITRATE 100 MG T: 100 | 10 days supply | Qty: 10 | Fill #5

## 2019-07-11 MED FILL — FREESTYLE LIBRE 14 DAY SENS: 28 days supply | Qty: 2 | Fill #8

## 2019-07-18 DIAGNOSIS — E113292 Type 2 diabetes mellitus with mild nonproliferative diabetic retinopathy without macular edema, left eye: Secondary | ICD-10-CM | POA: Diagnosis not present

## 2019-07-19 MED FILL — QUINAPRIL 40 MG TABLET: 40 | 90 days supply | Qty: 90 | Fill #0

## 2019-07-19 MED FILL — AMLODIPINE BESYLATE 5 MG TA: 5 | 30 days supply | Qty: 30 | Fill #1

## 2019-07-19 MED FILL — COLESEVELAM HCL 625 MG TABS: 625 | 90 days supply | Qty: 180 | Fill #0

## 2019-07-22 DIAGNOSIS — E1165 Type 2 diabetes mellitus with hyperglycemia: Secondary | ICD-10-CM | POA: Diagnosis not present

## 2019-07-22 DIAGNOSIS — H35033 Hypertensive retinopathy, bilateral: Secondary | ICD-10-CM | POA: Diagnosis not present

## 2019-07-22 DIAGNOSIS — Z5181 Encounter for therapeutic drug level monitoring: Secondary | ICD-10-CM | POA: Diagnosis not present

## 2019-07-22 DIAGNOSIS — Z794 Long term (current) use of insulin: Secondary | ICD-10-CM | POA: Diagnosis not present

## 2019-07-22 MED FILL — TRULICITY 0.75 MG/0.5 ML PE: 0.75 | 84 days supply | Qty: 6 | Fill #0

## 2019-07-26 NOTE — Progress Notes (Signed)
Chief Complaint  Patient presents with  . Follow-up    CAD   History of Present Illness: 63 yo male with history of CAD, HTN, HLD, DM and sleep apnea here today for cardiac follow up. In May 2009 he had an anterior MI treated with a drug-eluting stent in the LAD. I saw him in the office in July 2017 and he had symptoms c/w unstable angina. Cardiac cath August 2017 with severe stenosis proximal Circumflex treated with a drug eluting stent. Severe stenosis small caliber non-dominant RCA treated medically. LV function normal by LV gram in August 2017. He has not tolerated beta blockers due to bradycardia.  He is here today for follow up. The patient denies any dyspnea, palpitations, lower extremity edema, orthopnea, PND, dizziness, near syncope or syncope. He has rare chest pains.   Primary Care Physician: Aura Dials, MD Endocrine: Buddy Duty    Past Medical History:  Diagnosis Date  . Arthritis    "shoulders, right hip" (05/07/2016)  . Back pain   . CAD S/P percutaneous coronary angioplasty    a. DES to LAD in 2009 b. cath: 05/2016 w/ patent stent to LAD, 99% stenosis Prox Cx (treated w/ DES), and 99% stenosis of small caliber non-dominant RCA (no intervention performed).  . Carpal tunnel syndrome   . High cholesterol   . Hip pain   . History of heart attack   . Hypertension   . Metabolic syndrome   . OSA (obstructive sleep apnea)    "used mask; lost weight; stopped using mask" (05/07/2016)  . Tinnitus   . Type II diabetes mellitus ([place])     Past Surgical History:  Procedure Laterality Date  . CARDIAC CATHETERIZATION N/A 05/07/2016   Procedure: Left Heart Cath and Coronary Angiography;  Surgeon: Burnell Blanks, MD;  Location: [place] [facility];  Service: Cardiovascular;  Laterality: N/A;  . CARDIAC CATHETERIZATION N/A 05/07/2016   Procedure: Coronary Stent Intervention;  Surgeon: Burnell Blanks, MD;  Location: [place] [facility];  Service: Cardiovascular;  Laterality:  N/A;  . CORONARY ANGIOPLASTY WITH STENT PLACEMENT  02/2008; 05/07/2016  . TONSILLECTOMY  ~ 1969  . WRIST SURGERY      Current Outpatient Medications  Medication Sig Dispense Refill  . amLODipine (NORVASC) 5 MG tablet Take 1 tablet (5 mg total) by mouth daily. Please keep upcoming appt in October for future refills. Thank you 90 tablet 0  . aspirin EC 81 MG tablet Take 1 tablet (81 mg total) daily by mouth. 90 tablet 3  . clopidogrel (PLAVIX) 75 MG tablet TAKE 1 TABLET BY MOUTH ONCE DAILY 90 tablet 3  . colesevelam (WELCHOL) 625 MG tablet Take 1,250 mg by mouth daily.    . empagliflozin (JARDIANCE) 10 MG TABS tablet Take 10 mg by mouth daily.    Marland Kitchen ezetimibe (ZETIA) 10 MG tablet Take 10 mg by mouth daily.    . insulin aspart protamine- aspart (NOVOLOG MIX 70/30) (70-30) 100 UNIT/ML injection Inject 54 Units into the skin. 54 units in am - 54 units in pm    . Insulin Pen Needle 32G X 4 MM MISC 1 each by Does not apply route daily. 100 each 6  . metoprolol tartrate (LOPRESSOR) 25 MG tablet TAKE 1 TABLET BY MOUTH TWICE DAILY 60 tablet 8  . Multiple Vitamins-Minerals (MENS MULTIPLUS PO) Take by mouth.    . nitroGLYCERIN (NITROSTAT) 0.4 MG SL tablet Place 1 tablet (0.4 mg total) under the tongue every 5 (five) minutes as  needed for chest pain (X 3 DOSES MAX). 25 tablet 6  . quinapril (ACCUPRIL) 40 MG tablet Take 40 mg by mouth at bedtime. BLOOD PRESSURE    . rosuvastatin (CRESTOR) 40 MG tablet Take 1 tablet (40 mg total) by mouth daily. Please keep upcoming appt in October before anymore refills. Thank you 90 tablet 0  . sildenafil (VIAGRA) 100 MG tablet Take 1 tablet (100 mg total) by mouth daily as needed for erectile dysfunction. 10 tablet 5  . TRULICITY [id number] 0000000 SOPN Inject 0.75 mLs into the skin once a week.    . Vitamin D, Ergocalciferol, (DRISDOL) 1.25 MG (50000 UT) CAPS capsule Take 1 capsule (50,000 Units total) by mouth every 7 (seven) days. 4 capsule 0   No current  facility-administered medications for this visit.     No Known Allergies  Social History   Socioeconomic History  . Marital status: Married    Spouse name: Roderic Sayer  . Number of children: 3  . Years of education: Not on file  . Highest education level: Not on file  Occupational History  . Occupation: Retired  Scientific laboratory technician  . Financial resource strain: Not on file  . Food insecurity    Worry: Not on file    Inability: Not on file  . Transportation needs    Medical: Not on file    Non-medical: Not on file  Tobacco Use  . Smoking status: Never Smoker  . Smokeless tobacco: Never Used  Substance and Sexual Activity  . Alcohol use: No  . Drug use: No  . Sexual activity: Yes  Lifestyle  . Physical activity    Days per week: Not on file    Minutes per session: Not on file  . Stress: Not on file  Relationships  . Social Herbalist on phone: Not on file    Gets together: Not on file    Attends religious service: Not on file    Active member of club or organization: Not on file    Attends meetings of clubs or organizations: Not on file    Relationship status: Not on file  . Intimate partner violence    Fear of current or ex partner: Not on file    Emotionally abused: Not on file    Physically abused: Not on file    Forced sexual activity: Not on file  Other Topics Concern  . Not on file  Social History Narrative  . Not on file    Family History  Problem Relation Age of Onset  . Diabetes Mother   . Heart attack Father   . Hyperlipidemia Father   . Hypertension Father   . Heart disease Father     Review of Systems:  As stated in the HPI and otherwise negative.   BP (!) 168/96   Pulse 93   Ht 5\' 10"  (1.778 m)   Wt 268 lb 12.8 oz (121.9 kg)   SpO2 96%   BMI 38.57 kg/m   Physical Examination:  General: Well developed, well nourished, NAD  HEENT: OP clear, mucus membranes moist  SKIN: warm, dry. No rashes. Neuro: No focal deficits   Musculoskeletal: Muscle strength 5/5 all ext  Psychiatric: Mood and affect normal  Neck: No JVD, no carotid bruits, no thyromegaly, no lymphadenopathy.  Lungs:Clear bilaterally, no wheezes, rhonci, crackles Cardiovascular: Regular rate and rhythm. No murmurs, gallops or rubs. Abdomen:Soft. Bowel sounds present. Non-tender.  Extremities: No lower extremity edema. Pulses are 2 +  in the bilateral DP/PT.   EKG:  EKG is not ordered today. The ekg ordered today demonstrates   Recent Labs: 12/13/2018: ALT 24; BUN 14; Creatinine, Ser 1.07; Hemoglobin 15.2; Potassium 4.0; Sodium 140; TSH 2.990   Lipid Panel Followed in endocrine clinic.    Wt Readings from Last 3 Encounters:  07/27/19 268 lb 12.8 oz (121.9 kg)  12/27/18 260 lb (117.9 kg)  12/13/18 258 lb (117 kg)     Other studies Reviewed: Additional studies/ records that were reviewed today include: . Review of the above records demonstrates:    Assessment and Plan:   1. CAD without angina: No chest pain. Continue ASA, Plavix, beta blocker and statin.    2. HTN: BP is elevated today but has been controlled at home. No changes today.   3. HLD: This is followed in the endocrine clinic. He is on a statin and Zetia.   Current medicines are reviewed at length with the patient today.  The patient does not have concerns regarding medicines.  The following changes have been made:  no change  Labs/ tests ordered today include:   No orders of the defined types were placed in this encounter.  Disposition:   FU with me one year  Signed, Lauree Chandler, MD 07/27/2019 12:20 PM    [facility] [address], [place], [place]  [address] Phone: [phone]; Fax: [phone]

## 2019-07-27 ENCOUNTER — Ambulatory Visit (INDEPENDENT_AMBULATORY_CARE_PROVIDER_SITE_OTHER): Payer: Federal, State, Local not specified - PPO | Admitting: Cardiovascular Disease

## 2019-07-27 ENCOUNTER — Other Ambulatory Visit: Payer: Self-pay

## 2019-07-27 ENCOUNTER — Encounter: Payer: Self-pay | Admitting: Cardiovascular Disease

## 2019-07-27 VITALS — BP 168/96 | HR 93 | Ht 70.0 in | Wt 268.8 lb

## 2019-07-27 DIAGNOSIS — E782 Mixed hyperlipidemia: Secondary | ICD-10-CM | POA: Diagnosis not present

## 2019-07-27 DIAGNOSIS — I10 Essential (primary) hypertension: Secondary | ICD-10-CM | POA: Diagnosis not present

## 2019-07-27 DIAGNOSIS — I2510 Atherosclerotic heart disease of native coronary artery without angina pectoris: Secondary | ICD-10-CM | POA: Diagnosis not present

## 2019-07-27 MED ORDER — NITROGLYCERIN 0.4 MG SL SUBL: 0.4000 mg | SUBLINGUAL_TABLET | SUBLINGUAL | 6 refills | Status: DC | PRN

## 2019-07-27 MED FILL — NITROGLYCERIN 0.4 MG TAB SL: 0.4 | 8 days supply | Qty: 25 | Fill #0

## 2019-07-27 NOTE — Patient Instructions (Signed)

## 2019-08-02 MED FILL — NOVOLOG MIX 70-30 FLEXPEN S: (70-30) 100 | 90 days supply | Qty: 135 | Fill #2

## 2019-08-05 MED FILL — FREESTYLE LIBRE 14 DAY SENS: 28 days supply | Qty: 2 | Fill #9

## 2019-08-15 ENCOUNTER — Other Ambulatory Visit: Payer: Self-pay | Admitting: Cardiovascular Disease

## 2019-08-15 MED FILL — JARDIANCE 10 MG TABLET: 10 | 90 days supply | Qty: 90 | Fill #0

## 2019-08-15 MED FILL — METOPROLOL TARTRATE 25 MG T: 25 | 90 days supply | Qty: 180 | Fill #0

## 2019-08-15 MED FILL — AMLODIPINE BESYLATE 5 MG TA: 5 | 90 days supply | Qty: 90 | Fill #0

## 2019-08-15 MED FILL — SILDENAFIL CITRATE 100 MG T: 100 | 8 days supply | Qty: 8 | Fill #6

## 2019-08-16 ENCOUNTER — Other Ambulatory Visit: Payer: Self-pay

## 2019-08-16 MED ORDER — AMLODIPINE BESYLATE 5 MG PO TABS: 5.0000 mg | ORAL_TABLET | Freq: Every day | ORAL | 3 refills | Status: DC

## 2019-08-23 MED FILL — EZETIMIBE 10 MG TABS: 10 | 90 days supply | Qty: 90 | Fill #3

## 2019-09-05 MED FILL — FREESTYLE LIBRE 14 DAY SENS: 28 days supply | Qty: 2 | Fill #10

## 2019-09-16 ENCOUNTER — Other Ambulatory Visit: Payer: Self-pay | Admitting: Cardiovascular Disease

## 2019-09-16 ENCOUNTER — Other Ambulatory Visit: Payer: Self-pay | Admitting: Cardiology

## 2019-09-16 MED FILL — CLOPIDOGREL 75 MG TABLET: 75 | 90 days supply | Qty: 90 | Fill #0

## 2019-09-19 MED FILL — ROSUVASTATIN CALCIUM 40 MG: 40 | 90 days supply | Qty: 90 | Fill #0

## 2019-09-23 ENCOUNTER — Other Ambulatory Visit (HOSPITAL_COMMUNITY): Payer: Self-pay

## 2019-09-23 ENCOUNTER — Other Ambulatory Visit: Payer: Self-pay | Admitting: *Deleted

## 2019-09-23 MED ORDER — EZETIMIBE 10 MG PO TABS: 10.0000 mg | ORAL_TABLET | Freq: Every day | ORAL | 3 refills | Status: DC

## 2019-10-05 MED FILL — FREESTYLE LIBRE 14 DAY SENS: 28 days supply | Qty: 2 | Fill #11

## 2019-10-10 MED FILL — CLOPIDOGREL 75 MG TABLET: 75 | 90 days supply | Qty: 90 | Fill #0

## 2019-10-19 DIAGNOSIS — N400 Enlarged prostate without lower urinary tract symptoms: Secondary | ICD-10-CM | POA: Diagnosis not present

## 2019-10-19 DIAGNOSIS — E119 Type 2 diabetes mellitus without complications: Secondary | ICD-10-CM | POA: Diagnosis not present

## 2019-10-19 DIAGNOSIS — E785 Hyperlipidemia, unspecified: Secondary | ICD-10-CM | POA: Diagnosis not present

## 2019-10-19 DIAGNOSIS — I2510 Atherosclerotic heart disease of native coronary artery without angina pectoris: Secondary | ICD-10-CM | POA: Diagnosis not present

## 2019-10-19 DIAGNOSIS — I10 Essential (primary) hypertension: Secondary | ICD-10-CM | POA: Diagnosis not present

## 2019-10-20 MED FILL — QUINAPRIL 40 MG TABLET: 40 | 90 days supply | Qty: 90 | Fill #0

## 2019-10-20 MED FILL — COLESEVELAM HCL 625 MG TABS: 625 | 45 days supply | Qty: 90 | Fill #0

## 2019-11-02 DIAGNOSIS — M21962 Unspecified acquired deformity of left lower leg: Secondary | ICD-10-CM | POA: Diagnosis not present

## 2019-11-02 DIAGNOSIS — M21961 Unspecified acquired deformity of right lower leg: Secondary | ICD-10-CM | POA: Diagnosis not present

## 2019-11-02 DIAGNOSIS — E1140 Type 2 diabetes mellitus with diabetic neuropathy, unspecified: Secondary | ICD-10-CM | POA: Diagnosis not present

## 2019-11-03 MED FILL — TRULICITY 0.75 MG/0.5 ML PE: 0.75 | 84 days supply | Qty: 6 | Fill #1

## 2019-11-07 MED FILL — FREESTYLE LIBRE 14 DAY SENS: 28 days supply | Qty: 2 | Fill #12

## 2019-11-07 MED FILL — SILDENAFIL CITRATE 100 MG T: 100 | 30 days supply | Qty: 8 | Fill #0

## 2019-11-10 DIAGNOSIS — E1165 Type 2 diabetes mellitus with hyperglycemia: Secondary | ICD-10-CM | POA: Diagnosis not present

## 2019-11-10 DIAGNOSIS — Z794 Long term (current) use of insulin: Secondary | ICD-10-CM | POA: Diagnosis not present

## 2019-11-10 DIAGNOSIS — Z5181 Encounter for therapeutic drug level monitoring: Secondary | ICD-10-CM | POA: Diagnosis not present

## 2019-11-10 DIAGNOSIS — H35033 Hypertensive retinopathy, bilateral: Secondary | ICD-10-CM | POA: Diagnosis not present

## 2019-11-10 MED FILL — TRULICITY 1.5 MG/0.5 ML PEN: 1.5 | 84 days supply | Qty: 6 | Fill #0

## 2019-11-15 MED FILL — AMLODIPINE BESYLATE 5 MG TA: 5 | 90 days supply | Qty: 90 | Fill #0

## 2019-11-17 MED FILL — JARDIANCE 25 MG TABLET: 25 | 30 days supply | Qty: 30 | Fill #0

## 2019-11-18 DIAGNOSIS — H40023 Open angle with borderline findings, high risk, bilateral: Secondary | ICD-10-CM | POA: Diagnosis not present

## 2019-12-06 ENCOUNTER — Other Ambulatory Visit (HOSPITAL_COMMUNITY): Payer: Self-pay | Admitting: Internal Medicine

## 2019-12-06 MED FILL — EZETIMIBE 10 MG TABS: 10 | 90 days supply | Qty: 90 | Fill #0

## 2019-12-06 MED FILL — UNIFINE PENTIPS 32GX5/32: 32G X 4 MM | 90 days supply | Qty: 200 | Fill #1

## 2019-12-06 MED FILL — UNIFINE PENTIPS 32GX5/32": 32G X 4 MM | 90 days supply | Qty: 200 | Fill #1

## 2019-12-06 MED FILL — FREESTYLE LIBRE 14 DAY SENS: 28 days supply | Qty: 2 | Fill #0

## 2019-12-13 MED FILL — COLESEVELAM HCL 625 MG TABS: 625 | 45 days supply | Qty: 90 | Fill #0

## 2019-12-23 ENCOUNTER — Other Ambulatory Visit (HOSPITAL_COMMUNITY): Payer: Self-pay | Admitting: Internal Medicine

## 2019-12-23 MED FILL — ROSUVASTATIN CALCIUM 40 MG: 40 | 90 days supply | Qty: 90 | Fill #1

## 2019-12-23 MED FILL — JARDIANCE 25 MG TABLET: 25 | 30 days supply | Qty: 30 | Fill #0

## 2020-01-03 MED FILL — FREESTYLE LIBRE 14 DAY SENS: 28 days supply | Qty: 2 | Fill #1

## 2020-01-05 MED FILL — SILDENAFIL CITRATE 100 MG T: 100 | 30 days supply | Qty: 8 | Fill #1

## 2020-01-09 MED FILL — CLOPIDOGREL 75 MG TABLET: 75 | 90 days supply | Qty: 90 | Fill #1

## 2020-01-11 DIAGNOSIS — Z794 Long term (current) use of insulin: Secondary | ICD-10-CM | POA: Diagnosis not present

## 2020-01-11 DIAGNOSIS — H35033 Hypertensive retinopathy, bilateral: Secondary | ICD-10-CM | POA: Diagnosis not present

## 2020-01-11 DIAGNOSIS — Z5181 Encounter for therapeutic drug level monitoring: Secondary | ICD-10-CM | POA: Diagnosis not present

## 2020-01-11 DIAGNOSIS — E1165 Type 2 diabetes mellitus with hyperglycemia: Secondary | ICD-10-CM | POA: Diagnosis not present

## 2020-01-24 MED FILL — QUINAPRIL 40 MG TABLET: 40 | 90 days supply | Qty: 90 | Fill #1

## 2020-01-24 MED FILL — COLESEVELAM HCL 625 MG TABS: 625 | 45 days supply | Qty: 90 | Fill #1

## 2020-01-24 MED FILL — JARDIANCE 25 MG TABLET: 25 | 30 days supply | Qty: 30 | Fill #1

## 2020-01-31 MED FILL — FREESTYLE LIBRE 14 DAY SENS: 28 days supply | Qty: 2 | Fill #2

## 2020-02-09 ENCOUNTER — Other Ambulatory Visit (HOSPITAL_COMMUNITY): Payer: Self-pay | Admitting: Internal Medicine

## 2020-02-09 DIAGNOSIS — E1165 Type 2 diabetes mellitus with hyperglycemia: Secondary | ICD-10-CM | POA: Diagnosis not present

## 2020-02-09 MED FILL — NOVOLOG MIX 70-30 FLEXPEN S: (70-30) 100 | 83 days supply | Qty: 90 | Fill #0

## 2020-02-16 ENCOUNTER — Other Ambulatory Visit (HOSPITAL_COMMUNITY): Payer: Self-pay | Admitting: Internal Medicine

## 2020-02-27 MED FILL — FREESTYLE LIBRE 14 DAY SENS: 28 days supply | Qty: 2 | Fill #3

## 2020-03-06 MED FILL — EZETIMIBE 10 MG TABS: 10 | 90 days supply | Qty: 90 | Fill #1

## 2020-03-27 MED FILL — FREESTYLE LIBRE 14 DAY SENS: 28 days supply | Qty: 2 | Fill #4

## 2020-03-28 MED FILL — ROSUVASTATIN CALCIUM 40 MG: 40 | 90 days supply | Qty: 90 | Fill #2

## 2020-03-28 MED FILL — JARDIANCE 25 MG TABLET: 25 | 30 days supply | Qty: 30 | Fill #3

## 2020-04-03 DIAGNOSIS — H40023 Open angle with borderline findings, high risk, bilateral: Secondary | ICD-10-CM | POA: Diagnosis not present

## 2020-04-06 MED FILL — SILDENAFIL CITRATE 100 MG T: 100 | 30 days supply | Qty: 8 | Fill #3

## 2020-04-10 MED FILL — UNIFINE PENTIPS 32GX5/32: 32G X 4 MM | 90 days supply | Qty: 200 | Fill #2

## 2020-04-13 MED FILL — CLOPIDOGREL 75 MG TABLET: 75 | 90 days supply | Qty: 90 | Fill #2

## 2020-04-16 MED FILL — TRULICITY 3 MG/0.5ML SOPN: 3 | 28 days supply | Qty: 2 | Fill #1

## 2020-04-23 ENCOUNTER — Other Ambulatory Visit (HOSPITAL_COMMUNITY): Payer: Self-pay | Admitting: Family Medicine

## 2020-04-23 MED FILL — QUINAPRIL 40 MG TABLET: 40 | 90 days supply | Qty: 90 | Fill #0

## 2020-04-23 MED FILL — JARDIANCE 25 MG TABLET: 25 | 30 days supply | Qty: 30 | Fill #4

## 2020-04-30 MED FILL — FREESTYLE LIBRE 14 DAY SENS: 28 days supply | Qty: 2 | Fill #5

## 2020-05-14 DIAGNOSIS — I2510 Atherosclerotic heart disease of native coronary artery without angina pectoris: Secondary | ICD-10-CM | POA: Diagnosis not present

## 2020-05-14 DIAGNOSIS — Z794 Long term (current) use of insulin: Secondary | ICD-10-CM | POA: Diagnosis not present

## 2020-05-14 DIAGNOSIS — E1165 Type 2 diabetes mellitus with hyperglycemia: Secondary | ICD-10-CM | POA: Diagnosis not present

## 2020-05-16 NOTE — Progress Notes (Signed)
Cardiology Office Note    Date:  05/22/2020   ID:  Philip, Black [birth year], MRN [id number]  PCP:  Philip Dials, MD  Cardiologist: Philip Chandler, MD EPS: None  Chief Complaint  Patient presents with  . Follow-up    History of Present Illness:  Philip Black is a 64 y.o. male with history of CAD status post anterior MI 2009 treated with DES to the LAD, DES to the circumflex 05/2016, severe stenosis of a small caliber nondominant RCA treated medically, normal LV function, HTN, HLD, DM and sleep apnea    Patient last saw Dr. Angelena Form and was doing well.  Patient comes in for yearly f/u. BP and HR up to 98 but he says he always feels anxious when he comes in. Denies chest pain, dyspnea, dizziness,edema, palpitations. Has lost some weight-13 lbs.Trying to watch his weight but likes to snack. Work in Water engineer at [employer]. Walking on a treadmill 45 min daily. A1C 7.8. watches his salt closely.   Past Medical History:  Diagnosis Date  . Arthritis    "shoulders, right hip" (05/07/2016)  . Back pain   . CAD S/P percutaneous coronary angioplasty    a. DES to LAD in 2009 b. cath: 05/2016 w/ patent stent to LAD, 99% stenosis Prox Cx (treated w/ DES), and 99% stenosis of small caliber non-dominant RCA (no intervention performed).  . Carpal tunnel syndrome   . Coronary artery disease involving native coronary artery of native heart with unstable angina pectoris ([place]) 06/26/2010   Qualifier: Diagnosis of  By: Philip Perches, MD, Philip Black   . High cholesterol   . Hip pain   . History of heart attack   . Hypertension   . Hypertension associated with diabetes ([place]) 06/26/2010   Qualifier: Diagnosis of  By: Philip Perches, MD, Philip Black   . Metabolic syndrome   . OSA (obstructive sleep apnea)    "used mask; lost weight; stopped using mask" (05/07/2016)  . Tinnitus   . Type II diabetes mellitus ([place])   . Unstable angina ([place]) 05/07/2016    Past Surgical History:   Procedure Laterality Date  . CARDIAC CATHETERIZATION N/A 05/07/2016   Procedure: Left Heart Cath and Coronary Angiography;  Surgeon: Philip Blanks, MD;  Location: [place] [facility];  Service: Cardiovascular;  Laterality: N/A;  . CARDIAC CATHETERIZATION N/A 05/07/2016   Procedure: Coronary Stent Intervention;  Surgeon: Philip Blanks, MD;  Location: [place] [facility];  Service: Cardiovascular;  Laterality: N/A;  . CORONARY ANGIOPLASTY WITH STENT PLACEMENT  02/2008; 05/07/2016  . TONSILLECTOMY  ~ 1969  . WRIST SURGERY      Current Medications: Current Meds  Medication Sig  . amLODipine (NORVASC) 5 MG tablet Take 1 tablet (5 mg total) by mouth daily.  Marland Kitchen aspirin EC 81 MG tablet Take 1 tablet (81 mg total) daily by mouth.  . clopidogrel (PLAVIX) 75 MG tablet TAKE 1 TABLET BY MOUTH ONCE A DAY  . colesevelam (WELCHOL) 625 MG tablet Take 1,250 mg by mouth daily.  . empagliflozin (JARDIANCE) 10 MG TABS tablet Take 10 mg by mouth daily.  Marland Kitchen ezetimibe (ZETIA) 10 MG tablet Take 1 tablet (10 mg total) by mouth daily.  . insulin aspart protamine- aspart (NOVOLOG MIX 70/30) (70-30) 100 UNIT/ML injection Inject 54 Units into the skin. 54 units in am - 54 units in pm  . Insulin Pen Needle 32G X 4 MM MISC 1 each by Does not apply route daily.  Marland Kitchen  metoprolol tartrate (LOPRESSOR) 25 MG tablet TAKE 1 TABLET BY MOUTH TWICE DAILY  . Multiple Vitamins-Minerals (MENS MULTIPLUS PO) Take by mouth.  . nitroGLYCERIN (NITROSTAT) 0.4 MG SL tablet Place 1 tablet (0.4 mg total) under the tongue every 5 (five) minutes as needed for chest pain (X 3 DOSES MAX).  Marland Kitchen quinapril (ACCUPRIL) 40 MG tablet Take 40 mg by mouth at bedtime. BLOOD PRESSURE  . rosuvastatin (CRESTOR) 40 MG tablet Take 1 tablet (40 mg total) by mouth daily.  . sildenafil (VIAGRA) 100 MG tablet Take 1 tablet (100 mg total) by mouth daily as needed for erectile dysfunction.  . TRULICITY 3.78 HY/8.5OY SOPN Inject 0.75 mLs into the skin once a week.   . Vitamin D, Ergocalciferol, (DRISDOL) 1.25 MG (50000 UT) CAPS capsule Take 1 capsule (50,000 Units total) by mouth every 7 (seven) days.     Allergies:   Patient has no known allergies.   Social History   Socioeconomic History  . Marital status: Married    Spouse name: Philip Black  . Number of children: 3  . Years of education: Not on file  . Highest education level: Not on file  Occupational History  . Occupation: Retired  Tobacco Use  . Smoking status: Never Smoker  . Smokeless tobacco: Never Used  Substance and Sexual Activity  . Alcohol use: No  . Drug use: No  . Sexual activity: Yes  Other Topics Concern  . Not on file  Social History Narrative  . Not on file   Social Determinants of Health   Financial Resource Strain:   . Difficulty of Paying Living Expenses:   Food Insecurity:   . Worried About Charity fundraiser in the Last Year:   . Arboriculturist in the Last Year:   Transportation Needs:   . Film/video editor (Medical):   Marland Kitchen Lack of Transportation (Non-Medical):   Physical Activity:   . Days of Exercise per Week:   . Minutes of Exercise per Session:   Stress:   . Feeling of Stress :   Social Connections:   . Frequency of Communication with Friends and Family:   . Frequency of Social Gatherings with Friends and Family:   . Attends Religious Services:   . Active Member of Clubs or Organizations:   . Attends Archivist Meetings:   Marland Kitchen Marital Status:      Family History:  The patient's   family history includes Diabetes in his mother; Heart attack in his father; Heart disease in his father; Hyperlipidemia in his father; Hypertension in his father.   ROS:   Please see the history of present illness.    ROS All other systems reviewed and are negative.   PHYSICAL EXAM:   VS:  BP (!) 166/92   Pulse 98   Ht 5\' 10"  (1.778 m)   Wt 255 lb (115.7 kg)   SpO2 95%   BMI 36.59 kg/m   Physical Exam  GEN: Well nourished, well developed, in no  acute distress  Neck: no JVD, carotid bruits, or masses Cardiac:RRR; no murmurs, rubs, or gallops  Respiratory:  clear to auscultation bilaterally, normal work of breathing GI: soft, nontender, nondistended, + BS Ext: without cyanosis, clubbing, or edema, Good distal pulses bilaterally Neuro:  Alert and Oriented x 3Psych: euthymic mood, full affect  Wt Readings from Last 3 Encounters:  05/22/20 255 lb (115.7 kg)  07/27/19 268 lb 12.8 oz (121.9 kg)  12/27/18 260 lb (117.9 kg)  Studies/Labs Reviewed:   EKG:  EKG is  ordered today.  The ekg ordered today demonstrates NSR at 98/m  Recent Labs: No results found for requested labs within last 8760 hours.   Lipid Panel    Component Value Date/Time   CHOL 152 08/03/2018 0756   TRIG 83 08/03/2018 0756   HDL 66 08/03/2018 0756   CHOLHDL 2.3 08/03/2018 0756   CHOLHDL 2.2 10/24/2013 1152   VLDL 12 10/24/2013 1152   LDLCALC 69 08/03/2018 0756   LDLDIRECT 64 08/08/2009 0000    Additional studies/ records that were reviewed today include:  Cardiac cath 2017  Mid RCA lesion, 99 %stenosed.  Prox RCA lesion, 90 %stenosed.  Ost 2nd Mrg to 2nd Mrg lesion, 80 %stenosed.  4th Mrg lesion, 50 %stenosed.  Ost 1st Mrg lesion, 50 %stenosed.  1st Mrg lesion, 50 %stenosed.  Ost LAD to Prox LAD lesion, 10 %stenosed.  A STENT PROMUS PREM MR 3.0X12 drug eluting stent was successfully placed.  Ost Cx to Prox Cx lesion, 99 %stenosed.  Post intervention, there is a 0% residual stenosis.  Prox LAD lesion, 30 %stenosed.  The left ventricular systolic function is normal.  LV end diastolic pressure is normal.  The left ventricular ejection fraction is 50-55% by visual estimate.  There is no mitral valve regurgitation.   1. Triple vessel CAD 2. Patent stent proximal LAD with minimal restenosis 3. Severe stenosis ostial/proximal Circumflex 4. Successful PTCA/DES x 1 proximal Circumflex 5. Severe stenosis small caliber non-dominant  RCA 6. Low normal LV systolic function   Recommendations: Will start Brilinta and ASA and Brilinta for now. He will be enrolled in the TWILIGHT study. Continue statin.        ASSESSMENT:    1. Coronary artery disease involving native coronary artery of native heart without angina pectoris   2. Essential hypertension   3. Mixed hyperlipidemia   4. Obesity (BMI 30-39.9)   5. Uncontrolled type 2 diabetes mellitus with hyperglycemia (HCC)      PLAN:  In order of problems listed above:  1. CAD without angina: No chest pain. Continue ASA, Plavix, beta blocker and statin.  check labs today.   2. HTN:  BP and pulse high today. Just took his meds less than an hour ago. Hasn't been checking at home but will. He will send [place] a message through mychart with readings for 2 weeks.  3. HLD: This is followed in the endocrine clinic. He is on a statin and Zetia. LDL 66 10/2019  4. Obesity: exercising and watching his diet. Has lost 13 lbs  5. DM: A1C 7.8. needs tighter control     Medication Adjustments/Labs and Tests Ordered: Current medicines are reviewed at length with the patient today.  Concerns regarding medicines are outlined above.  Medication changes, Labs and Tests ordered today are listed in the Patient Instructions below. Patient Instructions  Medication Instructions:  Your physician recommends that you continue on your current medications as directed. Please refer to the Current Medication list given to you today.  *If you need a refill on your cardiac medications before your next appointment, please call your pharmacy*   Lab Work: TODAY: BMET, CBC  If you have labs (blood work) drawn today and your tests are completely normal, you will receive your results only by: Marland Kitchen MyChart Message (if you have MyChart) OR . A paper copy in the mail If you have any lab test that is abnormal or we need to change your treatment, we  will call you to review the  results.   Testing/Procedures: None   Follow-Up: At [facility], you and your health needs are our priority.  As part of our continuing mission to provide you with exceptional heart care, we have created designated Provider Care Teams.  These Care Teams include your primary Cardiologist (physician) and Advanced Practice Providers (APPs -  Physician Assistants and Nurse Practitioners) who all work together to provide you with the care you need, when you need it.  We recommend signing up for the patient portal called "MyChart".  Sign up information is provided on this After Visit Summary.  MyChart is used to connect with patients for Virtual Visits (Telemedicine).  Patients are able to view lab/test results, encounter notes, upcoming appointments, etc.  Non-urgent messages can be sent to your provider as well.   To learn more about what you can do with MyChart, go to [email or web address].    Your next appointment:   1 year(s)  The format for your next appointment:   In Person  Provider:   You may see Philip Chandler, MD or one of the following Advanced Practice Providers on your designated Care Team:    Melina Copa, PA-C  Ermalinda Barrios, PA-C    Other Instructions Check your Blood pressure and Heart Rate for 2 weeks and send [place] a log of it through [place]. Your goal blood pressure is to be under 135/85    Signed, Ermalinda Barrios, PA-C  05/22/2020 9:17 AM    [address], [place],   [address] Phone: [phone]; Fax: [phone]

## 2020-05-22 ENCOUNTER — Encounter: Payer: Self-pay | Admitting: Physician Assistant

## 2020-05-22 ENCOUNTER — Ambulatory Visit: Payer: Federal, State, Local not specified - PPO | Admitting: Physician Assistant

## 2020-05-22 ENCOUNTER — Other Ambulatory Visit: Payer: Self-pay

## 2020-05-22 VITALS — BP 166/92 | HR 98 | Ht 70.0 in | Wt 255.0 lb

## 2020-05-22 DIAGNOSIS — E782 Mixed hyperlipidemia: Secondary | ICD-10-CM

## 2020-05-22 DIAGNOSIS — I2510 Atherosclerotic heart disease of native coronary artery without angina pectoris: Secondary | ICD-10-CM

## 2020-05-22 DIAGNOSIS — E669 Obesity, unspecified: Secondary | ICD-10-CM

## 2020-05-22 DIAGNOSIS — I10 Essential (primary) hypertension: Secondary | ICD-10-CM

## 2020-05-22 DIAGNOSIS — E1165 Type 2 diabetes mellitus with hyperglycemia: Secondary | ICD-10-CM

## 2020-05-22 LAB — CBC
Hematocrit: 49.4 % (ref 37.5–51.0)
Hemoglobin: 16 g/dL (ref 13.0–17.7)
MCH: 31.1 pg (ref 26.6–33.0)
MCHC: 32.4 g/dL (ref 31.5–35.7)
MCV: 96 fL (ref 79–97)
Platelets: 226 10*3/uL (ref 150–450)
RBC: 5.14 x10E6/uL (ref 4.14–5.80)
RDW: 12.5 % (ref 11.6–15.4)
WBC: 11.2 10*3/uL — ABNORMAL HIGH (ref 3.4–10.8)

## 2020-05-22 LAB — BASIC METABOLIC PANEL
BUN/Creatinine Ratio: 18 (ref 10–24)
BUN: 24 mg/dL (ref 8–27)
CO2: 24 mmol/L (ref 20–29)
Calcium: 10 mg/dL (ref 8.6–10.2)
Chloride: 100 mmol/L (ref 96–106)
Creatinine, Ser: 1.33 mg/dL — ABNORMAL HIGH (ref 0.76–1.27)
GFR calc Af Amer: 66 mL/min/{1.73_m2} (ref >59)
GFR calc non Af Amer: 57 mL/min/{1.73_m2} — ABNORMAL LOW (ref >59)
Glucose: 267 mg/dL — ABNORMAL HIGH (ref 65–99)
Potassium: 4.4 mmol/L (ref 3.5–5.2)
Sodium: 139 mmol/L (ref 134–144)

## 2020-05-22 NOTE — Patient Instructions (Signed)
Medication Instructions:  Your physician recommends that you continue on your current medications as directed. Please refer to the Current Medication list given to you today.  *If you need a refill on your cardiac medications before your next appointment, please call your pharmacy*   Lab Work: TODAY: BMET, CBC  If you have labs (blood work) drawn today and your tests are completely normal, you will receive your results only by: Marland Kitchen MyChart Message (if you have MyChart) OR . A paper copy in the mail If you have any lab test that is abnormal or we need to change your treatment, we will call you to review the results.   Testing/Procedures: None   Follow-Up: At [facility], you and your health needs are our priority.  As part of our continuing mission to provide you with exceptional heart care, we have created designated Provider Care Teams.  These Care Teams include your primary Cardiologist (physician) and Advanced Practice Providers (APPs -  Physician Assistants and Nurse Practitioners) who all work together to provide you with the care you need, when you need it.  We recommend signing up for the patient portal called "MyChart".  Sign up information is provided on this After Visit Summary.  MyChart is used to connect with patients for Virtual Visits (Telemedicine).  Patients are able to view lab/test results, encounter notes, upcoming appointments, etc.  Non-urgent messages can be sent to your provider as well.   To learn more about what you can do with MyChart, go to [email or web address].    Your next appointment:   1 year(s)  The format for your next appointment:   In Person  Provider:   You may see Lauree Chandler, MD or one of the following Advanced Practice Providers on your designated Care Team:    Melina Copa, PA-C  Ermalinda Barrios, PA-C    Other Instructions Check your Blood pressure and Heart Rate for 2 weeks and send [place] a log of it through [place]. Your goal  blood pressure is to be under 135/85

## 2020-05-23 ENCOUNTER — Other Ambulatory Visit: Payer: Self-pay

## 2020-05-23 DIAGNOSIS — I25110 Atherosclerotic heart disease of native coronary artery with unstable angina pectoris: Secondary | ICD-10-CM

## 2020-05-23 DIAGNOSIS — I10 Essential (primary) hypertension: Secondary | ICD-10-CM

## 2020-05-23 DIAGNOSIS — I152 Hypertension secondary to endocrine disorders: Secondary | ICD-10-CM

## 2020-05-23 DIAGNOSIS — E1159 Type 2 diabetes mellitus with other circulatory complications: Secondary | ICD-10-CM

## 2020-05-23 MED FILL — TRULICITY 3 MG/0.5ML SOPN: 3 | 28 days supply | Qty: 2 | Fill #2

## 2020-05-23 MED FILL — FREESTYLE LIBRE 14 DAY SENS: 28 days supply | Qty: 2 | Fill #6

## 2020-05-23 MED FILL — AMLODIPINE BESYLATE 5 MG TA: 5 | 90 days supply | Qty: 90 | Fill #2

## 2020-05-23 MED FILL — SILDENAFIL CITRATE 100 MG T: 100 | 30 days supply | Qty: 8 | Fill #4

## 2020-05-29 DIAGNOSIS — K080 Exfoliation of teeth due to systemic causes: Secondary | ICD-10-CM | POA: Diagnosis not present

## 2020-05-29 MED FILL — AMOXICILLIN 500 MG CAPSULE: 500 | 7 days supply | Qty: 21 | Fill #0

## 2020-06-04 MED FILL — JARDIANCE 25 MG TABLET: 25 | 30 days supply | Qty: 30 | Fill #5

## 2020-06-04 MED FILL — NOVOLOG MIX 70-30 FLEXPEN S: (70-30) 100 | 83 days supply | Qty: 90 | Fill #1

## 2020-06-04 MED FILL — EZETIMIBE 10 MG TABS: 10 | 90 days supply | Qty: 90 | Fill #2

## 2020-06-13 DIAGNOSIS — K006 Disturbances in tooth eruption: Secondary | ICD-10-CM | POA: Diagnosis not present

## 2020-06-13 MED FILL — HYDROCODON-APAP 5-325: 5-325 | 1 days supply | Qty: 4 | Fill #0

## 2020-06-13 MED FILL — PENICILLIN VK 500 MG TABLET: 500 | 7 days supply | Qty: 28 | Fill #0

## 2020-06-15 MED FILL — COLESEVELAM HCL 625 MG TABS: 625 | 90 days supply | Qty: 180 | Fill #0

## 2020-06-18 DIAGNOSIS — K006 Disturbances in tooth eruption: Secondary | ICD-10-CM | POA: Diagnosis not present

## 2020-06-18 DIAGNOSIS — K029 Dental caries, unspecified: Secondary | ICD-10-CM | POA: Diagnosis not present

## 2020-06-18 MED FILL — HYDROCODON-APAP 5-325: 5-325 | 2 days supply | Qty: 14 | Fill #0

## 2020-06-18 MED FILL — PENICILLIN VK 500 MG TABLET: 500 | 7 days supply | Qty: 28 | Fill #0

## 2020-06-22 ENCOUNTER — Other Ambulatory Visit: Payer: Federal, State, Local not specified - PPO

## 2020-06-25 MED FILL — TRULICITY 3 MG/0.5ML SOPN: 3 | 28 days supply | Qty: 2 | Fill #3

## 2020-06-25 MED FILL — FREESTYLE LIBRE 14 DAY SENS: 28 days supply | Qty: 2 | Fill #7

## 2020-06-26 ENCOUNTER — Other Ambulatory Visit: Payer: Self-pay

## 2020-06-26 ENCOUNTER — Other Ambulatory Visit: Payer: Federal, State, Local not specified - PPO

## 2020-06-26 DIAGNOSIS — I10 Essential (primary) hypertension: Secondary | ICD-10-CM

## 2020-06-26 DIAGNOSIS — E1159 Type 2 diabetes mellitus with other circulatory complications: Secondary | ICD-10-CM

## 2020-06-26 DIAGNOSIS — I152 Hypertension secondary to endocrine disorders: Secondary | ICD-10-CM

## 2020-06-26 DIAGNOSIS — I25110 Atherosclerotic heart disease of native coronary artery with unstable angina pectoris: Secondary | ICD-10-CM | POA: Diagnosis not present

## 2020-06-26 LAB — BASIC METABOLIC PANEL
BUN/Creatinine Ratio: 18 (ref 10–24)
BUN: 24 mg/dL (ref 8–27)
CO2: 23 mmol/L (ref 20–29)
Calcium: 9.7 mg/dL (ref 8.6–10.2)
Chloride: 100 mmol/L (ref 96–106)
Creatinine, Ser: 1.34 mg/dL — ABNORMAL HIGH (ref 0.76–1.27)
GFR calc Af Amer: 65 mL/min/{1.73_m2} (ref >59)
GFR calc non Af Amer: 56 mL/min/{1.73_m2} — ABNORMAL LOW (ref >59)
Glucose: 188 mg/dL — ABNORMAL HIGH (ref 65–99)
Potassium: 4.3 mmol/L (ref 3.5–5.2)
Sodium: 139 mmol/L (ref 134–144)

## 2020-06-27 ENCOUNTER — Telehealth: Payer: Self-pay

## 2020-06-27 NOTE — Telephone Encounter (Signed)
-----   Message from Charlie Pitter, PA-C sent at 06/27/2020  7:53 AM EDT ----- Covering Michele's box. Please let pt know kidney function remains mildly elevated, but consistent with last month. (previous values ranging 0.9-1.2, most recently 1.33 and now 1.34). Recommend to avoid NSAIDS if he isnt doing so already - stay away from medicines like ibuprofen, Advil, Motrin, naproxen, and Aleve due to risk of worsening kidney function. I'd recommend he f/u with primary care for further evaluation for this. Dayna Dunn PA-C

## 2020-06-27 NOTE — Telephone Encounter (Signed)
Attempted to call the patient with recent lab results. No answer and unable to leave a message due to voicemail box being full.

## 2020-07-02 ENCOUNTER — Other Ambulatory Visit: Payer: Self-pay | Admitting: Cardiology

## 2020-07-02 ENCOUNTER — Other Ambulatory Visit: Payer: Self-pay | Admitting: Cardiovascular Disease

## 2020-07-02 MED FILL — JARDIANCE 25 MG TABLET: 25 | 30 days supply | Qty: 30 | Fill #6

## 2020-07-02 MED FILL — ROSUVASTATIN CALCIUM 40 MG: 40 | 90 days supply | Qty: 90 | Fill #0

## 2020-07-18 MED FILL — CLOPIDOGREL 75 MG TABLET: 75 | 90 days supply | Qty: 90 | Fill #3

## 2020-07-20 MED FILL — FREESTYLE LIBRE 14 DAY SENS: 28 days supply | Qty: 2 | Fill #8

## 2020-07-30 DIAGNOSIS — E113292 Type 2 diabetes mellitus with mild nonproliferative diabetic retinopathy without macular edema, left eye: Secondary | ICD-10-CM | POA: Diagnosis not present

## 2020-07-30 MED FILL — QUINAPRIL HCL 40 MG TABS: 40 | 90 days supply | Qty: 90 | Fill #1

## 2020-07-31 MED FILL — TRULICITY 3 MG/0.5ML SOPN: 3 | 28 days supply | Qty: 2 | Fill #4

## 2020-08-06 MED FILL — JARDIANCE 25 MG TABLET: 25 | 30 days supply | Qty: 30 | Fill #7

## 2020-08-14 DIAGNOSIS — E1165 Type 2 diabetes mellitus with hyperglycemia: Secondary | ICD-10-CM | POA: Diagnosis not present

## 2020-08-14 DIAGNOSIS — Z794 Long term (current) use of insulin: Secondary | ICD-10-CM | POA: Diagnosis not present

## 2020-08-14 DIAGNOSIS — I2510 Atherosclerotic heart disease of native coronary artery without angina pectoris: Secondary | ICD-10-CM | POA: Diagnosis not present

## 2020-08-17 ENCOUNTER — Other Ambulatory Visit: Payer: Self-pay

## 2020-08-17 ENCOUNTER — Inpatient Hospital Stay: Payer: Federal, State, Local not specified - PPO | Attending: Family Medicine

## 2020-08-17 DIAGNOSIS — Z23 Encounter for immunization: Secondary | ICD-10-CM

## 2020-08-17 NOTE — Progress Notes (Signed)
Administered Pfizer COVID booster shot and observed for 15 minutes without any complain or concerns.

## 2020-08-20 ENCOUNTER — Other Ambulatory Visit (HOSPITAL_COMMUNITY): Payer: Self-pay | Admitting: Family Medicine

## 2020-08-20 MED FILL — FREESTYLE LIBRE 14 DAY SENS: 28 days supply | Qty: 2 | Fill #9

## 2020-08-20 MED FILL — UNIFINE PENTIPS 32GX5/32: 32G X 4 MM | 90 days supply | Qty: 200 | Fill #0

## 2020-09-03 ENCOUNTER — Other Ambulatory Visit: Payer: Self-pay | Admitting: Cardiovascular Disease

## 2020-09-03 ENCOUNTER — Other Ambulatory Visit: Payer: Self-pay | Admitting: Cardiology

## 2020-09-03 MED ORDER — METOPROLOL TARTRATE 25 MG PO TABS: 25.0000 mg | ORAL_TABLET | Freq: Two times a day (BID) | ORAL | 2 refills | Status: DC

## 2020-09-03 MED FILL — AMLODIPINE BESYLATE 5 MG TA: 5 | 90 days supply | Qty: 90 | Fill #0

## 2020-09-03 MED FILL — METOPROLOL TARTRATE 25 MG T: 25 | 90 days supply | Qty: 180 | Fill #0

## 2020-09-10 MED FILL — EZETIMIBE 10 MG TABS: 10 | 90 days supply | Qty: 90 | Fill #3

## 2020-09-10 MED FILL — JARDIANCE 25 MG TABLET: 25 | 30 days supply | Qty: 30 | Fill #8

## 2020-09-18 ENCOUNTER — Other Ambulatory Visit (HOSPITAL_COMMUNITY): Payer: Self-pay | Admitting: Family Medicine

## 2020-09-18 MED FILL — COLESEVELAM HCL 625 MG TABS: 625 | 90 days supply | Qty: 180 | Fill #0

## 2020-10-08 ENCOUNTER — Other Ambulatory Visit (HOSPITAL_COMMUNITY): Payer: Self-pay | Admitting: Family Medicine

## 2020-10-08 MED FILL — NOVOLOG MIX 70-30 FLEXPEN S: (70-30) 100 | 83 days supply | Qty: 90 | Fill #2

## 2020-10-08 MED FILL — TRULICITY 3 MG/0.5ML SOPN: 3 | 84 days supply | Qty: 6 | Fill #5

## 2020-10-08 MED FILL — JARDIANCE 25 MG TABLET: 25 | 30 days supply | Qty: 30 | Fill #9

## 2020-10-08 MED FILL — ROSUVASTATIN CALCIUM 40 MG: 40 | 90 days supply | Qty: 90 | Fill #1

## 2020-10-08 MED FILL — SILDENAFIL CITRATE 100 MG T: 100 | 30 days supply | Qty: 8 | Fill #0

## 2020-10-09 MED FILL — FREESTYLE LIBRE 14 DAY SENS: 28 days supply | Qty: 2 | Fill #10

## 2020-10-23 ENCOUNTER — Other Ambulatory Visit: Payer: Self-pay | Admitting: Cardiovascular Disease

## 2020-10-23 MED FILL — CLOPIDOGREL 75 MG TABLET: 75 | 90 days supply | Qty: 90 | Fill #0

## 2020-11-01 DIAGNOSIS — E1140 Type 2 diabetes mellitus with diabetic neuropathy, unspecified: Secondary | ICD-10-CM | POA: Diagnosis not present

## 2020-11-01 DIAGNOSIS — I70293 Other atherosclerosis of native arteries of extremities, bilateral legs: Secondary | ICD-10-CM | POA: Diagnosis not present

## 2020-11-01 DIAGNOSIS — S92501A Displaced unspecified fracture of right lesser toe(s), initial encounter for closed fracture: Secondary | ICD-10-CM | POA: Diagnosis not present

## 2020-11-01 DIAGNOSIS — M21961 Unspecified acquired deformity of right lower leg: Secondary | ICD-10-CM | POA: Diagnosis not present

## 2020-11-06 MED FILL — FREESTYLE LIBRE 14 DAY SENS: 28 days supply | Qty: 2 | Fill #11

## 2020-11-12 ENCOUNTER — Other Ambulatory Visit (HOSPITAL_COMMUNITY): Payer: Self-pay | Admitting: Family Medicine

## 2020-11-12 MED FILL — QUINAPRIL HCL 40 MG TABS: 40 | 90 days supply | Qty: 90 | Fill #0

## 2020-11-12 MED FILL — JARDIANCE 25 MG TABLET: 25 | 30 days supply | Qty: 30 | Fill #10

## 2020-11-15 DIAGNOSIS — I70219 Atherosclerosis of native arteries of extremities with intermittent claudication, unspecified extremity: Secondary | ICD-10-CM | POA: Diagnosis not present

## 2020-11-15 DIAGNOSIS — I739 Peripheral vascular disease, unspecified: Secondary | ICD-10-CM | POA: Diagnosis not present

## 2020-11-16 DIAGNOSIS — Z794 Long term (current) use of insulin: Secondary | ICD-10-CM | POA: Diagnosis not present

## 2020-11-16 DIAGNOSIS — I2510 Atherosclerotic heart disease of native coronary artery without angina pectoris: Secondary | ICD-10-CM | POA: Diagnosis not present

## 2020-11-16 DIAGNOSIS — E1165 Type 2 diabetes mellitus with hyperglycemia: Secondary | ICD-10-CM | POA: Diagnosis not present

## 2020-11-30 DIAGNOSIS — H40013 Open angle with borderline findings, low risk, bilateral: Secondary | ICD-10-CM | POA: Diagnosis not present

## 2020-12-06 ENCOUNTER — Other Ambulatory Visit (HOSPITAL_COMMUNITY): Payer: Self-pay | Admitting: Internal Medicine

## 2020-12-06 MED FILL — FREESTYLE LIBRE 14 DAY SENS: 28 days supply | Qty: 2 | Fill #0

## 2020-12-06 MED FILL — SILDENAFIL CITRATE 100 MG T: 100 | 30 days supply | Qty: 8 | Fill #0

## 2020-12-07 MED FILL — AMLODIPINE BESYLATE 5 MG TA: 5 | 90 days supply | Qty: 90 | Fill #1

## 2020-12-17 ENCOUNTER — Other Ambulatory Visit: Payer: Self-pay | Admitting: Cardiovascular Disease

## 2020-12-24 DIAGNOSIS — M21962 Unspecified acquired deformity of left lower leg: Secondary | ICD-10-CM | POA: Diagnosis not present

## 2020-12-24 DIAGNOSIS — M21961 Unspecified acquired deformity of right lower leg: Secondary | ICD-10-CM | POA: Diagnosis not present

## 2020-12-24 DIAGNOSIS — E1140 Type 2 diabetes mellitus with diabetic neuropathy, unspecified: Secondary | ICD-10-CM | POA: Diagnosis not present

## 2020-12-24 DIAGNOSIS — I70293 Other atherosclerosis of native arteries of extremities, bilateral legs: Secondary | ICD-10-CM | POA: Diagnosis not present

## 2020-12-25 ENCOUNTER — Other Ambulatory Visit (HOSPITAL_COMMUNITY): Payer: Self-pay | Admitting: Family Medicine

## 2020-12-25 DIAGNOSIS — E1140 Type 2 diabetes mellitus with diabetic neuropathy, unspecified: Secondary | ICD-10-CM | POA: Diagnosis not present

## 2020-12-25 DIAGNOSIS — M546 Pain in thoracic spine: Secondary | ICD-10-CM | POA: Diagnosis not present

## 2020-12-25 DIAGNOSIS — I10 Essential (primary) hypertension: Secondary | ICD-10-CM | POA: Diagnosis not present

## 2020-12-25 DIAGNOSIS — E785 Hyperlipidemia, unspecified: Secondary | ICD-10-CM | POA: Diagnosis not present

## 2020-12-25 DIAGNOSIS — M25512 Pain in left shoulder: Secondary | ICD-10-CM | POA: Diagnosis not present

## 2020-12-25 MED FILL — METHOCARBAMOL 500 MG TABS: 500 | 15 days supply | Qty: 30 | Fill #0

## 2021-01-02 MED FILL — UNIFINE PENTIPS 32GX5/32: 32G X 4 MM | 90 days supply | Qty: 200 | Fill #1

## 2021-01-02 MED FILL — FREESTYLE LIBRE 14 DAY SENS: 28 days supply | Qty: 2 | Fill #1

## 2021-01-03 MED FILL — METOPROLOL TARTRATE 25 MG T: 25 | 90 days supply | Qty: 180 | Fill #0

## 2021-01-07 ENCOUNTER — Other Ambulatory Visit (HOSPITAL_COMMUNITY): Payer: Self-pay

## 2021-01-10 ENCOUNTER — Other Ambulatory Visit (HOSPITAL_COMMUNITY): Payer: Self-pay

## 2021-01-12 ENCOUNTER — Other Ambulatory Visit (HOSPITAL_COMMUNITY): Payer: Self-pay

## 2021-01-12 MED ORDER — COLESEVELAM HCL 625 MG PO TABS
ORAL_TABLET | ORAL | 1 refills | Status: DC
  Filled 2021-01-12: qty 180, 90d supply, fill #0
  Filled 2021-04-10: qty 180, 90d supply, fill #1

## 2021-01-14 ENCOUNTER — Other Ambulatory Visit (HOSPITAL_BASED_OUTPATIENT_CLINIC_OR_DEPARTMENT_OTHER): Payer: Self-pay | Admitting: Internal Medicine

## 2021-01-14 ENCOUNTER — Other Ambulatory Visit (HOSPITAL_BASED_OUTPATIENT_CLINIC_OR_DEPARTMENT_OTHER): Payer: Self-pay

## 2021-01-14 ENCOUNTER — Other Ambulatory Visit (HOSPITAL_COMMUNITY): Payer: Self-pay

## 2021-01-14 MED FILL — Rosuvastatin Calcium Tab 40 MG: ORAL | 90 days supply | Qty: 90 | Fill #0 | Status: AC

## 2021-01-21 ENCOUNTER — Other Ambulatory Visit (HOSPITAL_COMMUNITY): Payer: Self-pay

## 2021-01-21 MED ORDER — NOVOLOG MIX 70/30 FLEXPEN (70-30) 100 UNIT/ML ~~LOC~~ SUPN
64.0000 [IU] | PEN_INJECTOR | Freq: Two times a day (BID) | SUBCUTANEOUS | 4 refills | Status: DC
  Filled 2021-01-21: qty 90, 70d supply, fill #0
  Filled 2021-05-07: qty 90, 70d supply, fill #1
  Filled 2021-09-02: qty 12, 9d supply, fill #2

## 2021-01-22 ENCOUNTER — Other Ambulatory Visit (HOSPITAL_COMMUNITY): Payer: Self-pay | Admitting: Internal Medicine

## 2021-01-22 ENCOUNTER — Other Ambulatory Visit (HOSPITAL_COMMUNITY): Payer: Self-pay

## 2021-01-23 ENCOUNTER — Other Ambulatory Visit (HOSPITAL_COMMUNITY): Payer: Self-pay

## 2021-01-23 ENCOUNTER — Other Ambulatory Visit (HOSPITAL_COMMUNITY): Payer: Self-pay | Admitting: Internal Medicine

## 2021-01-24 ENCOUNTER — Other Ambulatory Visit (HOSPITAL_COMMUNITY): Payer: Self-pay

## 2021-01-25 ENCOUNTER — Other Ambulatory Visit (HOSPITAL_COMMUNITY): Payer: Self-pay

## 2021-01-25 MED ORDER — JARDIANCE 25 MG PO TABS
25.0000 mg | ORAL_TABLET | Freq: Every day | ORAL | 3 refills | Status: DC
  Filled 2021-01-25: qty 90, 90d supply, fill #0
  Filled 2021-04-26: qty 90, 90d supply, fill #1
  Filled 2021-07-29: qty 90, 90d supply, fill #2
  Filled 2021-10-30: qty 90, 90d supply, fill #3

## 2021-01-29 DIAGNOSIS — M21961 Unspecified acquired deformity of right lower leg: Secondary | ICD-10-CM | POA: Diagnosis not present

## 2021-01-29 DIAGNOSIS — M21962 Unspecified acquired deformity of left lower leg: Secondary | ICD-10-CM | POA: Diagnosis not present

## 2021-01-29 DIAGNOSIS — E1140 Type 2 diabetes mellitus with diabetic neuropathy, unspecified: Secondary | ICD-10-CM | POA: Diagnosis not present

## 2021-01-30 ENCOUNTER — Other Ambulatory Visit (HOSPITAL_COMMUNITY): Payer: Self-pay

## 2021-01-30 MED FILL — Clopidogrel Bisulfate Tab 75 MG (Base Equiv): ORAL | 90 days supply | Qty: 90 | Fill #0 | Status: AC

## 2021-01-31 ENCOUNTER — Other Ambulatory Visit (HOSPITAL_COMMUNITY): Payer: Self-pay

## 2021-02-04 ENCOUNTER — Other Ambulatory Visit (HOSPITAL_COMMUNITY): Payer: Self-pay

## 2021-02-04 MED ORDER — SILDENAFIL CITRATE 100 MG PO TABS
ORAL_TABLET | ORAL | 0 refills | Status: DC
  Filled 2021-02-04: qty 8, 30d supply, fill #0

## 2021-02-05 ENCOUNTER — Other Ambulatory Visit (HOSPITAL_COMMUNITY): Payer: Self-pay

## 2021-02-05 MED FILL — Continuous Glucose System Sensor: 28 days supply | Qty: 2 | Fill #0 | Status: AC

## 2021-02-13 ENCOUNTER — Other Ambulatory Visit (HOSPITAL_COMMUNITY): Payer: Self-pay

## 2021-02-13 DIAGNOSIS — E669 Obesity, unspecified: Secondary | ICD-10-CM | POA: Diagnosis not present

## 2021-02-13 DIAGNOSIS — I2510 Atherosclerotic heart disease of native coronary artery without angina pectoris: Secondary | ICD-10-CM | POA: Diagnosis not present

## 2021-02-13 DIAGNOSIS — E1165 Type 2 diabetes mellitus with hyperglycemia: Secondary | ICD-10-CM | POA: Diagnosis not present

## 2021-02-13 DIAGNOSIS — Z794 Long term (current) use of insulin: Secondary | ICD-10-CM | POA: Diagnosis not present

## 2021-02-13 MED ORDER — TRULICITY 4.5 MG/0.5ML ~~LOC~~ SOAJ
SUBCUTANEOUS | 4 refills | Status: DC
  Filled 2021-02-13: qty 6, 84d supply, fill #0
  Filled 2021-06-04: qty 6, 84d supply, fill #1
  Filled 2021-09-24: qty 6, 84d supply, fill #2
  Filled 2021-12-30: qty 2, 28d supply, fill #3
  Filled 2022-02-11: qty 2, 28d supply, fill #4

## 2021-02-18 ENCOUNTER — Other Ambulatory Visit (HOSPITAL_COMMUNITY): Payer: Self-pay

## 2021-02-19 ENCOUNTER — Other Ambulatory Visit (HOSPITAL_COMMUNITY): Payer: Self-pay

## 2021-02-20 ENCOUNTER — Other Ambulatory Visit (HOSPITAL_COMMUNITY): Payer: Self-pay

## 2021-02-21 ENCOUNTER — Other Ambulatory Visit (HOSPITAL_COMMUNITY): Payer: Self-pay

## 2021-02-21 MED ORDER — QUINAPRIL HCL 40 MG PO TABS
ORAL_TABLET | ORAL | 3 refills | Status: DC
  Filled 2021-02-21: qty 90, 90d supply, fill #0
  Filled 2021-05-27: qty 90, 90d supply, fill #1
  Filled 2021-09-02: qty 90, 90d supply, fill #2

## 2021-03-05 ENCOUNTER — Other Ambulatory Visit (HOSPITAL_COMMUNITY): Payer: Self-pay

## 2021-03-05 MED FILL — Continuous Glucose System Sensor: 28 days supply | Qty: 2 | Fill #1 | Status: AC

## 2021-03-15 ENCOUNTER — Other Ambulatory Visit (HOSPITAL_COMMUNITY): Payer: Self-pay

## 2021-03-15 MED FILL — Ezetimibe Tab 10 MG: ORAL | 90 days supply | Qty: 90 | Fill #0 | Status: AC

## 2021-03-15 MED FILL — Amlodipine Besylate Tab 5 MG (Base Equivalent): ORAL | 90 days supply | Qty: 90 | Fill #0 | Status: AC

## 2021-03-29 DIAGNOSIS — E1142 Type 2 diabetes mellitus with diabetic polyneuropathy: Secondary | ICD-10-CM | POA: Diagnosis not present

## 2021-03-29 DIAGNOSIS — S90211A Contusion of right great toe with damage to nail, initial encounter: Secondary | ICD-10-CM | POA: Diagnosis not present

## 2021-04-10 ENCOUNTER — Other Ambulatory Visit (HOSPITAL_COMMUNITY): Payer: Self-pay

## 2021-04-10 MED FILL — Continuous Glucose System Sensor: 28 days supply | Qty: 2 | Fill #2 | Status: AC

## 2021-04-12 ENCOUNTER — Other Ambulatory Visit (HOSPITAL_COMMUNITY): Payer: Self-pay

## 2021-04-18 ENCOUNTER — Other Ambulatory Visit (HOSPITAL_COMMUNITY): Payer: Self-pay

## 2021-04-18 MED FILL — Rosuvastatin Calcium Tab 40 MG: ORAL | 90 days supply | Qty: 90 | Fill #1 | Status: AC

## 2021-04-23 ENCOUNTER — Other Ambulatory Visit (HOSPITAL_COMMUNITY): Payer: Self-pay

## 2021-04-23 DIAGNOSIS — M542 Cervicalgia: Secondary | ICD-10-CM | POA: Diagnosis not present

## 2021-04-23 DIAGNOSIS — I10 Essential (primary) hypertension: Secondary | ICD-10-CM | POA: Diagnosis not present

## 2021-04-23 DIAGNOSIS — N529 Male erectile dysfunction, unspecified: Secondary | ICD-10-CM | POA: Diagnosis not present

## 2021-04-23 DIAGNOSIS — M25512 Pain in left shoulder: Secondary | ICD-10-CM | POA: Diagnosis not present

## 2021-04-23 MED ORDER — SILDENAFIL CITRATE 100 MG PO TABS
ORAL_TABLET | ORAL | 5 refills | Status: DC
  Filled 2021-04-23: qty 8, 30d supply, fill #0
  Filled 2021-07-09: qty 8, 30d supply, fill #1
  Filled 2021-10-16: qty 8, 30d supply, fill #2
  Filled 2021-12-13: qty 8, 30d supply, fill #3
  Filled 2022-02-10: qty 8, 30d supply, fill #4
  Filled 2022-03-28: qty 8, 30d supply, fill #5

## 2021-04-26 ENCOUNTER — Other Ambulatory Visit (HOSPITAL_COMMUNITY): Payer: Self-pay

## 2021-05-01 ENCOUNTER — Telehealth: Payer: Self-pay | Admitting: Cardiovascular Disease

## 2021-05-01 NOTE — Telephone Encounter (Signed)
Left messages on patient's home and mobile numbers to call back.  Left message on Courtney's VM that our list indicates pt takes Lopressor 25 mg BID and that we should know what his HR is before knowing if ok to increase to 50 mg.

## 2021-05-01 NOTE — Telephone Encounter (Signed)
Dr. Orland Mustard from [facility] in [place] wants to know if patient can take metoprolol tartrate (LOPRESSOR) 50 MG tablet 2 times daily. Patient was taking metoprolol tartrate (LOPRESSOR) 25 MG tablet once daily. BP was   165/101.    [phone]; Loma Sousa and can leave a detailed messsag

## 2021-05-02 NOTE — Telephone Encounter (Signed)
Spoke with Loma Sousa and advised Drema Dallas has left message for pt to contact office.  Pt is due for follow up 05/2021 with Dr Angelena Form.  Loma Sousa states pt HR was 87 in the office on 04/30/2021 and would appreciate a courtesy follow up phone call once we have decided on further treatment for pt.  Advised will forward message to [facility] to address once she has returned to the office.  Courtney verbalizes understanding and thanked Therapist, sports for the call.

## 2021-05-02 NOTE — Telephone Encounter (Signed)
Philip Black is returning a call

## 2021-05-02 NOTE — Telephone Encounter (Signed)
Spoke with pt and B/P the other day was 145/90 at PCP office visit .Pt will purchase B/P machine and keep log and send readings via mychart for review Will forward message to Rodman Key RN to see about earlier appt availability ./cy

## 2021-05-02 NOTE — Telephone Encounter (Signed)
Patient called back requesting to speak with RN.  I made him aware Michalene, RN is out of the office and she will contact him to discuss further when she returns. We scheduled for Dr. Camillia Herter next available, 10/23/20 and I informed him if RN feels he needs to be seen sooner something will be worked out.

## 2021-05-03 ENCOUNTER — Other Ambulatory Visit: Payer: Self-pay

## 2021-05-03 ENCOUNTER — Other Ambulatory Visit (HOSPITAL_BASED_OUTPATIENT_CLINIC_OR_DEPARTMENT_OTHER): Payer: Self-pay

## 2021-05-03 ENCOUNTER — Ambulatory Visit: Payer: Federal, State, Local not specified - PPO | Attending: Internal Medicine

## 2021-05-03 DIAGNOSIS — Z23 Encounter for immunization: Secondary | ICD-10-CM

## 2021-05-03 MED ORDER — PFIZER-BIONT COVID-19 VAC-TRIS 30 MCG/0.3ML IM SUSP
INTRAMUSCULAR | 0 refills | Status: DC
  Filled 2021-05-03: qty 0.3, 1d supply, fill #0

## 2021-05-03 NOTE — Progress Notes (Signed)
   Covid-19 Vaccination Clinic  Name:  Philip Black    MRN: [id number] DOB: [birth year]  05/03/2021  Philip Black was observed post Covid-19 immunization for 15 minutes without incident. He was provided with Vaccine Information Sheet and instruction to access the V-Safe system.   Philip Black was instructed to call 911 with any severe reactions post vaccine: Difficulty breathing  Swelling of face and throat  A fast heartbeat  A bad rash all over body  Dizziness and weakness   Immunizations Administered     Name Date Dose VIS Date Route   PFIZER Comrnaty(Gray TOP) Covid-19 Vaccine 05/03/2021 11:07 AM 0.3 mL 09/13/2020 Intramuscular   Manufacturer: [employer]   Lot: [id number]   [place]: [phone]

## 2021-05-06 ENCOUNTER — Other Ambulatory Visit: Payer: Self-pay | Admitting: Cardiovascular Disease

## 2021-05-06 ENCOUNTER — Telehealth: Payer: Self-pay | Admitting: Cardiovascular Disease

## 2021-05-06 ENCOUNTER — Other Ambulatory Visit (HOSPITAL_COMMUNITY): Payer: Self-pay

## 2021-05-06 NOTE — Telephone Encounter (Signed)
Pt is returning call from Friday. Please advise pt further

## 2021-05-06 NOTE — Telephone Encounter (Signed)
Called pt to follow up on previous call last week.  I let pt know that I think call was in regards to PCP wanting to increase metoprolol dosage.  RN would like for him to send in HR prior to increase.  Pt reports that he was not taking metoprolol correctly.  He was taking medication QD instead of BID.  Pt expresses that he will start taking medication twice daily today.  He will check BP and HR daily for 1 week and send in readings through mychart.  I also advised him to reach out to PCP to inform  them that he was not taking metoprolol properly and medication increase is not needed at this moment.  He verbalizes understanding.

## 2021-05-07 ENCOUNTER — Other Ambulatory Visit (HOSPITAL_COMMUNITY): Payer: Self-pay

## 2021-05-07 MED ORDER — CLOPIDOGREL BISULFATE 75 MG PO TABS
ORAL_TABLET | Freq: Every day | ORAL | 1 refills | Status: DC
  Filled 2021-05-07: qty 90, 90d supply, fill #0
  Filled 2021-08-05: qty 90, 90d supply, fill #1

## 2021-05-07 MED FILL — Continuous Glucose System Sensor: 28 days supply | Qty: 2 | Fill #3 | Status: AC

## 2021-05-07 NOTE — Telephone Encounter (Signed)
Will await BP readings and have added to wait list for sooner appointment.  Pt due Aug 2022.

## 2021-05-08 ENCOUNTER — Other Ambulatory Visit (HOSPITAL_COMMUNITY): Payer: Self-pay

## 2021-05-15 ENCOUNTER — Other Ambulatory Visit (HOSPITAL_COMMUNITY): Payer: Self-pay

## 2021-05-15 DIAGNOSIS — M25512 Pain in left shoulder: Secondary | ICD-10-CM | POA: Diagnosis not present

## 2021-05-15 DIAGNOSIS — M542 Cervicalgia: Secondary | ICD-10-CM | POA: Diagnosis not present

## 2021-05-15 DIAGNOSIS — M25511 Pain in right shoulder: Secondary | ICD-10-CM | POA: Diagnosis not present

## 2021-05-15 MED ORDER — METHOCARBAMOL 500 MG PO TABS
ORAL_TABLET | ORAL | 0 refills | Status: DC
  Filled 2021-05-15: qty 60, 15d supply, fill #0

## 2021-05-15 MED ORDER — PREDNISONE 10 MG (21) PO TBPK
ORAL_TABLET | ORAL | 0 refills | Status: DC
  Filled 2021-05-15: qty 21, 6d supply, fill #0

## 2021-05-17 DIAGNOSIS — Z794 Long term (current) use of insulin: Secondary | ICD-10-CM | POA: Diagnosis not present

## 2021-05-17 DIAGNOSIS — I2510 Atherosclerotic heart disease of native coronary artery without angina pectoris: Secondary | ICD-10-CM | POA: Diagnosis not present

## 2021-05-17 DIAGNOSIS — E1165 Type 2 diabetes mellitus with hyperglycemia: Secondary | ICD-10-CM | POA: Diagnosis not present

## 2021-05-17 DIAGNOSIS — E669 Obesity, unspecified: Secondary | ICD-10-CM | POA: Diagnosis not present

## 2021-05-27 ENCOUNTER — Other Ambulatory Visit (HOSPITAL_COMMUNITY): Payer: Self-pay

## 2021-06-04 ENCOUNTER — Other Ambulatory Visit (HOSPITAL_COMMUNITY): Payer: Self-pay

## 2021-06-04 MED ORDER — INSULIN PEN NEEDLE 32G X 4 MM MISC
1 refills | Status: DC
  Filled 2021-06-04 – 2021-06-05 (×2): qty 200, 90d supply, fill #0
  Filled 2021-11-28: qty 200, 90d supply, fill #1

## 2021-06-04 MED FILL — Continuous Glucose System Sensor: 28 days supply | Qty: 2 | Fill #4 | Status: AC

## 2021-06-05 ENCOUNTER — Other Ambulatory Visit (HOSPITAL_COMMUNITY): Payer: Self-pay

## 2021-06-06 ENCOUNTER — Other Ambulatory Visit (HOSPITAL_COMMUNITY): Payer: Self-pay

## 2021-06-13 DIAGNOSIS — G603 Idiopathic progressive neuropathy: Secondary | ICD-10-CM | POA: Diagnosis not present

## 2021-06-13 DIAGNOSIS — I2510 Atherosclerotic heart disease of native coronary artery without angina pectoris: Secondary | ICD-10-CM | POA: Diagnosis not present

## 2021-06-13 DIAGNOSIS — E1142 Type 2 diabetes mellitus with diabetic polyneuropathy: Secondary | ICD-10-CM | POA: Diagnosis not present

## 2021-06-13 DIAGNOSIS — E1165 Type 2 diabetes mellitus with hyperglycemia: Secondary | ICD-10-CM | POA: Diagnosis not present

## 2021-06-13 DIAGNOSIS — I10 Essential (primary) hypertension: Secondary | ICD-10-CM | POA: Diagnosis not present

## 2021-06-13 DIAGNOSIS — M2550 Pain in unspecified joint: Secondary | ICD-10-CM | POA: Diagnosis not present

## 2021-06-13 DIAGNOSIS — M205X2 Other deformities of toe(s) (acquired), left foot: Secondary | ICD-10-CM | POA: Diagnosis not present

## 2021-06-20 ENCOUNTER — Other Ambulatory Visit (HOSPITAL_COMMUNITY): Payer: Self-pay

## 2021-06-20 ENCOUNTER — Other Ambulatory Visit: Payer: Self-pay | Admitting: Cardiovascular Disease

## 2021-06-20 MED ORDER — AMLODIPINE BESYLATE 5 MG PO TABS
ORAL_TABLET | Freq: Every day | ORAL | 1 refills | Status: DC
  Filled 2021-06-20: qty 90, 90d supply, fill #0
  Filled 2021-09-13: qty 90, 90d supply, fill #1

## 2021-06-20 MED FILL — Ezetimibe Tab 10 MG: ORAL | 90 days supply | Qty: 90 | Fill #1 | Status: AC

## 2021-07-08 ENCOUNTER — Other Ambulatory Visit (HOSPITAL_COMMUNITY): Payer: Self-pay

## 2021-07-08 MED FILL — Continuous Glucose System Sensor: 28 days supply | Qty: 2 | Fill #5 | Status: AC

## 2021-07-09 ENCOUNTER — Other Ambulatory Visit (HOSPITAL_COMMUNITY): Payer: Self-pay

## 2021-07-10 ENCOUNTER — Other Ambulatory Visit (HOSPITAL_COMMUNITY): Payer: Self-pay

## 2021-07-17 ENCOUNTER — Other Ambulatory Visit (HOSPITAL_COMMUNITY): Payer: Self-pay

## 2021-07-17 MED ORDER — COLESEVELAM HCL 625 MG PO TABS
ORAL_TABLET | ORAL | 0 refills | Status: DC
  Filled 2021-07-17: qty 180, 90d supply, fill #0

## 2021-07-18 ENCOUNTER — Other Ambulatory Visit (HOSPITAL_COMMUNITY): Payer: Self-pay

## 2021-07-25 DIAGNOSIS — H40013 Open angle with borderline findings, low risk, bilateral: Secondary | ICD-10-CM | POA: Diagnosis not present

## 2021-07-29 ENCOUNTER — Other Ambulatory Visit (HOSPITAL_COMMUNITY): Payer: Self-pay

## 2021-07-29 ENCOUNTER — Other Ambulatory Visit: Payer: Self-pay | Admitting: Cardiovascular Disease

## 2021-07-29 MED ORDER — ROSUVASTATIN CALCIUM 40 MG PO TABS
ORAL_TABLET | Freq: Every day | ORAL | 1 refills | Status: DC
  Filled 2021-07-29: qty 90, 90d supply, fill #0
  Filled 2021-10-30: qty 90, 90d supply, fill #1

## 2021-08-05 ENCOUNTER — Other Ambulatory Visit (HOSPITAL_COMMUNITY): Payer: Self-pay

## 2021-08-05 MED FILL — Metoprolol Tartrate Tab 25 MG: ORAL | 90 days supply | Qty: 180 | Fill #0 | Status: AC

## 2021-08-08 ENCOUNTER — Other Ambulatory Visit (HOSPITAL_COMMUNITY): Payer: Self-pay

## 2021-08-08 MED FILL — Continuous Glucose System Sensor: 28 days supply | Qty: 2 | Fill #6 | Status: AC

## 2021-08-19 DIAGNOSIS — E1165 Type 2 diabetes mellitus with hyperglycemia: Secondary | ICD-10-CM | POA: Diagnosis not present

## 2021-08-19 DIAGNOSIS — E669 Obesity, unspecified: Secondary | ICD-10-CM | POA: Diagnosis not present

## 2021-08-19 DIAGNOSIS — Z794 Long term (current) use of insulin: Secondary | ICD-10-CM | POA: Diagnosis not present

## 2021-08-19 DIAGNOSIS — I2510 Atherosclerotic heart disease of native coronary artery without angina pectoris: Secondary | ICD-10-CM | POA: Diagnosis not present

## 2021-08-20 ENCOUNTER — Other Ambulatory Visit (HOSPITAL_COMMUNITY): Payer: Self-pay

## 2021-08-20 MED ORDER — QUINAPRIL HCL 40 MG PO TABS
ORAL_TABLET | ORAL | 0 refills | Status: DC
  Filled 2021-08-20: qty 90, 90d supply, fill #0

## 2021-09-02 ENCOUNTER — Other Ambulatory Visit (HOSPITAL_COMMUNITY): Payer: Self-pay

## 2021-09-02 MED ORDER — NOVOLOG MIX 70/30 FLEXPEN (70-30) 100 UNIT/ML ~~LOC~~ SUPN
PEN_INJECTOR | SUBCUTANEOUS | 2 refills | Status: DC
  Filled 2021-09-02: qty 144, 90d supply, fill #0
  Filled 2022-02-12: qty 144, 90d supply, fill #1
  Filled 2022-07-14: qty 144, 90d supply, fill #2

## 2021-09-02 MED FILL — Continuous Glucose System Sensor: 28 days supply | Qty: 2 | Fill #7 | Status: AC

## 2021-09-03 ENCOUNTER — Other Ambulatory Visit (HOSPITAL_COMMUNITY): Payer: Self-pay

## 2021-09-04 ENCOUNTER — Other Ambulatory Visit (HOSPITAL_COMMUNITY): Payer: Self-pay

## 2021-09-13 ENCOUNTER — Other Ambulatory Visit (HOSPITAL_COMMUNITY): Payer: Self-pay

## 2021-09-17 ENCOUNTER — Encounter: Payer: Self-pay | Admitting: Cardiovascular Disease

## 2021-09-17 ENCOUNTER — Other Ambulatory Visit (HOSPITAL_COMMUNITY): Payer: Self-pay

## 2021-09-17 ENCOUNTER — Ambulatory Visit: Payer: Federal, State, Local not specified - PPO | Admitting: Cardiovascular Disease

## 2021-09-17 ENCOUNTER — Other Ambulatory Visit: Payer: Self-pay

## 2021-09-17 VITALS — BP 148/88 | HR 81 | Ht 70.0 in | Wt 251.4 lb

## 2021-09-17 DIAGNOSIS — I10 Essential (primary) hypertension: Secondary | ICD-10-CM

## 2021-09-17 DIAGNOSIS — I2510 Atherosclerotic heart disease of native coronary artery without angina pectoris: Secondary | ICD-10-CM

## 2021-09-17 DIAGNOSIS — E782 Mixed hyperlipidemia: Secondary | ICD-10-CM

## 2021-09-17 MED ORDER — LISINOPRIL 40 MG PO TABS
40.0000 mg | ORAL_TABLET | Freq: Every day | ORAL | 3 refills | Status: DC
  Filled 2021-09-17: qty 90, 90d supply, fill #0
  Filled 2021-12-19: qty 90, 90d supply, fill #1
  Filled 2022-03-28: qty 90, 90d supply, fill #2
  Filled 2022-07-01: qty 90, 90d supply, fill #3

## 2021-09-17 NOTE — Patient Instructions (Signed)
Medication Instructions:  Your physician has recommended you make the following change in your medication:  1.) stop quinapril  2.) stop clopidogrel (Plavix) 3.) start lisinopril 40 mg - take one tablet daily  *If you need a refill on your cardiac medications before your next appointment, please call your pharmacy*   Lab Work: none   Testing/Procedures: Your physician has requested that you have an echocardiogram. Echocardiography is a painless test that uses sound waves to create images of your heart. It provides your doctor with information about the size and shape of your heart and how well your hearts chambers and valves are working. This procedure takes approximately one hour. There are no restrictions for this procedure.   Follow-Up: At [facility], you and your health needs are our priority.  As part of our continuing mission to provide you with exceptional heart care, we have created designated Provider Care Teams.  These Care Teams include your primary Cardiologist (physician) and Advanced Practice Providers (APPs -  Physician Assistants and Nurse Practitioners) who all work together to provide you with the care you need, when you need it.   Your next appointment:   12 month(s)  The format for your next appointment:   In Person  Provider:   Lauree Chandler, MD

## 2021-09-17 NOTE — Progress Notes (Signed)
Chief Complaint  Patient presents with   Follow-up    CAD    History of Present Illness: 65 yo male with history of CAD, HTN, HLD, DM and sleep apnea here today for cardiac follow up. In May 2009 he had an anterior MI treated with a drug-eluting stent in the LAD. I saw him in the office in July 2017 and he had symptoms c/w unstable angina. Cardiac cath August 2017 with severe stenosis proximal Circumflex treated with a drug eluting stent. Severe stenosis small caliber non-dominant RCA treated medically. LV function normal by LV gram in August 2017. He has not tolerated beta blockers due to bradycardia.  He is here today for follow up. The patient denies any chest pain, dyspnea, palpitations, lower extremity edema, orthopnea, PND, dizziness, near syncope or syncope.   Primary Care Physician: London Pepper, MD Endocrine: Buddy Duty    Past Medical History:  Diagnosis Date   Arthritis    "shoulders, right hip" (05/07/2016)   Back pain    CAD S/P percutaneous coronary angioplasty    a. DES to LAD in 2009 b. cath: 05/2016 w/ patent stent to LAD, 99% stenosis Prox Cx (treated w/ DES), and 99% stenosis of small caliber non-dominant RCA (no intervention performed).   Carpal tunnel syndrome    Coronary artery disease involving native coronary artery of native heart with unstable angina pectoris ([place]) 06/26/2010   Qualifier: Diagnosis of  By: Olevia Perches, MD, Glenetta Hew    High cholesterol    Hip pain    History of heart attack    Hypertension    Hypertension associated with diabetes ([place]) 06/26/2010   Qualifier: Diagnosis of  By: Olevia Perches, MD, Glenetta Hew    Metabolic syndrome    OSA (obstructive sleep apnea)    "used mask; lost weight; stopped using mask" (05/07/2016)   Tinnitus    Type II diabetes mellitus ([place])    Unstable angina ([place]) 05/07/2016    Past Surgical History:  Procedure Laterality Date   CARDIAC CATHETERIZATION N/A 05/07/2016   Procedure: Left Heart Cath and Coronary  Angiography;  Surgeon: Burnell Blanks, MD;  Location: [place] [facility];  Service: Cardiovascular;  Laterality: N/A;   CARDIAC CATHETERIZATION N/A 05/07/2016   Procedure: Coronary Stent Intervention;  Surgeon: Burnell Blanks, MD;  Location: [place] [facility];  Service: Cardiovascular;  Laterality: N/A;   CORONARY ANGIOPLASTY WITH STENT PLACEMENT  02/2008; 05/07/2016   TONSILLECTOMY  ~ 1969   WRIST SURGERY      Current Outpatient Medications  Medication Sig Dispense Refill   amLODipine (NORVASC) 5 MG tablet TAKE 1 TABLET BY MOUTH ONCE DAILY 90 tablet 1   aspirin EC 81 MG tablet Take 1 tablet (81 mg total) daily by mouth. 90 tablet 3   colesevelam (WELCHOL) 625 MG tablet Take 1,250 mg by mouth daily.     Continuous Blood Gluc Sensor (FREESTYLE LIBRE 14 DAY SENSOR) MISC CHANGE SENSOR EVERY 14 DAYS AS DIRECTED 2 each 11   COVID-19 mRNA Vac-TriS, Pfizer, (PFIZER-BIONT COVID-19 VAC-TRIS) SUSP injection Inject into the muscle. 0.3 mL 0   Dulaglutide (TRULICITY) 4.5 ZO/1.0RU SOPN Inject 4.5 mg under the skin once a week 6 mL 4   empagliflozin (JARDIANCE) 25 MG TABS tablet Take 1 tablet (25 mg total) by mouth daily. 90 tablet 3   ezetimibe (ZETIA) 10 MG tablet TAKE 1 TABLET BY MOUTH ONCE A DAY 90 tablet 3   insulin aspart protamine - aspart (NOVOLOG MIX 70/30 FLEXPEN) (  70-30) 100 UNIT/ML FlexPen Inject 80 units under the skin at breakfast, & 80 units under the skin at evening meal (twice a day.) 150 mL 2   insulin aspart protamine- aspart (NOVOLOG MIX 70/30) (70-30) 100 UNIT/ML injection Inject 54 Units into the skin. 54 units in am - 54 units in pm     Insulin Pen Needle 32G X 4 MM MISC Use as directed 2 times a day as directed 200 each 1   lisinopril (ZESTRIL) 40 MG tablet Take 1 tablet (40 mg total) by mouth daily. 90 tablet 3   methocarbamol (ROBAXIN) 500 MG tablet Take 1 tablet by mouth 4 times a day. 60 tablet 0   metoprolol tartrate (LOPRESSOR) 25 MG tablet TAKE 1 TABLET BY MOUTH 2  TIMES DAILY. 180 tablet 2   Multiple Vitamins-Minerals (MENS MULTIPLUS PO) Take by mouth.     nitroGLYCERIN (NITROSTAT) 0.4 MG SL tablet Place 1 tablet (0.4 mg total) under the tongue every 5 (five) minutes as needed for chest pain (X 3 DOSES MAX). 25 tablet 6   rosuvastatin (CRESTOR) 40 MG tablet TAKE 1 TABLET BY MOUTH DAILY. 90 tablet 1   sildenafil (VIAGRA) 100 MG tablet Take 1/2 to 1 tablet by mouth 30 to 60 minutes prior to intercourse. Need office visit for more fills 8 tablet 5   TRULICITY 4.19 QQ/2.2LN SOPN Inject 0.75 mLs into the skin once a week.     Vitamin D, Ergocalciferol, (DRISDOL) 1.25 MG (50000 UT) CAPS capsule Take 1 capsule (50,000 Units total) by mouth every 7 (seven) days. 4 capsule 0   empagliflozin (JARDIANCE) 10 MG TABS tablet Take 10 mg by mouth daily. (Patient not taking: Reported on 09/17/2021)     insulin aspart protamine - aspart (NOVOLOG MIX 70/30 FLEXPEN) (70-30) 100 UNIT/ML FlexPen Inject 0.64 mLs (64 Units total) into the skin 2 (two) times daily with a meal. 38.4 mL 4   predniSONE (STERAPRED UNI-PAK 21 TAB) 10 MG (21) TBPK tablet Take as directed within package instructions. (Patient not taking: Reported on 09/17/2021) 21 each 0   No current facility-administered medications for this visit.    Allergies  Allergen Reactions   Canagliflozin-Metformin Hcl Other (See Comments)    Social History   Socioeconomic History   Marital status: Married    Spouse name: Estaban [place]   Number of children: 3   Years of education: Not on file   Highest education level: Not on file  Occupational History   Occupation: Retired  Tobacco Use   Smoking status: Never   Smokeless tobacco: Never  Substance and Sexual Activity   Alcohol use: No   Drug use: No   Sexual activity: Yes  Other Topics Concern   Not on file  Social History Narrative   Not on file   Social Determinants of Health   Financial Resource Strain: Not on file  Food Insecurity: Not on file   Transportation Needs: Not on file  Physical Activity: Not on file  Stress: Not on file  Social Connections: Not on file  Intimate Partner Violence: Not on file    Family History  Problem Relation Age of Onset   Diabetes Mother    Heart attack Father    Hyperlipidemia Father    Hypertension Father    Heart disease Father     Review of Systems:  As stated in the HPI and otherwise negative.   BP (!) 148/88   Pulse 81   Ht 5\' 10"  (1.778 m)  Wt 251 lb 6.4 oz (114 kg)   SpO2 94%   BMI 36.07 kg/m   Physical Examination:  General: Well developed, well nourished, NAD  HEENT: OP clear, mucus membranes moist  SKIN: warm, dry. No rashes. Neuro: No focal deficits  Musculoskeletal: Muscle strength 5/5 all ext  Psychiatric: Mood and affect normal  Neck: No JVD, no carotid bruits, no thyromegaly, no lymphadenopathy.  Lungs:Clear bilaterally, no wheezes, rhonci, crackles Cardiovascular: Regular rate and rhythm. No murmurs, gallops or rubs. Abdomen:Soft. Bowel sounds present. Non-tender.  Extremities: No lower extremity edema. Pulses are 2 + in the bilateral DP/PT.  EKG:  EKG is ordered today. The ekg ordered today demonstrates Sinus  Recent Labs: No results found for requested labs within last 8760 hours.   Lipid Panel Followed in endocrine clinic.    Wt Readings from Last 3 Encounters:  09/17/21 251 lb 6.4 oz (114 kg)  05/22/20 255 lb (115.7 kg)  07/27/19 268 lb 12.8 oz (121.9 kg)     Other studies Reviewed: Additional studies/ records that were reviewed today include: . Review of the above records demonstrates:    Assessment and Plan:   1. CAD without angina: He has no chest pain. Will continue ASA, statin and beta blocker.   Will arrange echo to assess LVEF.   2. HTN: BP is elevated today but he has been out of Quinapril. He has had trouble with this being available in his pharmacy. Will change to Lisinopril 40 mg daily.   3. HLD: This is followed in the  endocrine clinic. He is on a statin and Zetia.  Current medicines are reviewed at length with the patient today.  The patient does not have concerns regarding medicines.  The following changes have been made:  no change  Labs/ tests ordered today include:   Orders Placed This Encounter  Procedures   EKG 12-Lead   ECHOCARDIOGRAM COMPLETE    Disposition:   F/U with me one year  Signed, Lauree Chandler, MD 09/17/2021 1:21 PM    [facility] [address], [place], [place]  [address] Phone: [phone]; Fax: [phone]

## 2021-09-24 ENCOUNTER — Other Ambulatory Visit (HOSPITAL_COMMUNITY): Payer: Self-pay

## 2021-09-24 MED FILL — Ezetimibe Tab 10 MG: ORAL | 90 days supply | Qty: 90 | Fill #2 | Status: AC

## 2021-09-25 ENCOUNTER — Other Ambulatory Visit (HOSPITAL_COMMUNITY): Payer: Self-pay

## 2021-10-08 ENCOUNTER — Other Ambulatory Visit (HOSPITAL_COMMUNITY): Payer: Self-pay

## 2021-10-08 MED FILL — Continuous Glucose System Sensor: 28 days supply | Qty: 2 | Fill #8 | Status: AC

## 2021-10-16 ENCOUNTER — Other Ambulatory Visit (HOSPITAL_COMMUNITY): Payer: Self-pay

## 2021-10-16 MED ORDER — COLESEVELAM HCL 625 MG PO TABS
ORAL_TABLET | ORAL | 0 refills | Status: DC
  Filled 2021-10-16: qty 180, 90d supply, fill #0

## 2021-10-17 ENCOUNTER — Ambulatory Visit (HOSPITAL_COMMUNITY): Payer: Federal, State, Local not specified - PPO | Attending: Cardiovascular Disease

## 2021-10-17 ENCOUNTER — Other Ambulatory Visit: Payer: Self-pay

## 2021-10-17 DIAGNOSIS — I2510 Atherosclerotic heart disease of native coronary artery without angina pectoris: Secondary | ICD-10-CM

## 2021-10-17 DIAGNOSIS — I10 Essential (primary) hypertension: Secondary | ICD-10-CM | POA: Diagnosis not present

## 2021-10-17 DIAGNOSIS — E782 Mixed hyperlipidemia: Secondary | ICD-10-CM | POA: Diagnosis not present

## 2021-10-17 LAB — ECHOCARDIOGRAM COMPLETE
Area-P 1/2: 2.3 cm2
S' Lateral: 3.6 cm

## 2021-10-21 ENCOUNTER — Other Ambulatory Visit (HOSPITAL_COMMUNITY): Payer: Self-pay

## 2021-10-23 ENCOUNTER — Ambulatory Visit: Payer: Federal, State, Local not specified - PPO | Admitting: Cardiovascular Disease

## 2021-10-30 ENCOUNTER — Other Ambulatory Visit (HOSPITAL_COMMUNITY): Payer: Self-pay

## 2021-10-30 MED FILL — Continuous Glucose System Sensor: 28 days supply | Qty: 2 | Fill #9 | Status: AC

## 2021-11-04 DIAGNOSIS — Z125 Encounter for screening for malignant neoplasm of prostate: Secondary | ICD-10-CM | POA: Diagnosis not present

## 2021-11-04 DIAGNOSIS — Z23 Encounter for immunization: Secondary | ICD-10-CM | POA: Diagnosis not present

## 2021-11-04 DIAGNOSIS — I10 Essential (primary) hypertension: Secondary | ICD-10-CM | POA: Diagnosis not present

## 2021-11-04 DIAGNOSIS — E559 Vitamin D deficiency, unspecified: Secondary | ICD-10-CM | POA: Diagnosis not present

## 2021-11-04 DIAGNOSIS — E785 Hyperlipidemia, unspecified: Secondary | ICD-10-CM | POA: Diagnosis not present

## 2021-11-04 DIAGNOSIS — Z0000 Encounter for general adult medical examination without abnormal findings: Secondary | ICD-10-CM | POA: Diagnosis not present

## 2021-11-04 DIAGNOSIS — I2510 Atherosclerotic heart disease of native coronary artery without angina pectoris: Secondary | ICD-10-CM | POA: Diagnosis not present

## 2021-11-11 ENCOUNTER — Telehealth: Payer: Self-pay | Admitting: Cardiovascular Disease

## 2021-11-11 NOTE — Telephone Encounter (Signed)
Routing message to Dr. Camillia Herter nurse for review.

## 2021-11-11 NOTE — Telephone Encounter (Signed)
Philip Black, Dr. Darien Ramus Nurse called. Dr. Orland Mustard wanted to relay a message about the patient to Dr. Angelena Form or his RN.  Dr. Orland Mustard wanted to ask Dr. Angelena Form if he could increase the patient's Metoprolol from Dr. Orland Mustard saw the patient 11/04/21. His BP at this visit was 147/85 HR 72.  Dr. Orland Mustard is concerned about the patient's elevated BP   Pt c/o medication issue:  1. Name of Medication: metoprolol tartrate (LOPRESSOR) 25 MG tablet   2. How are you currently taking this medication (dosage and times per day)? 25 mg BID  3. Are you having a reaction (difficulty breathing--STAT)?   4. What is your medication issue? Dr. Orland Mustard wanted to increase the medication to 50 mg BID.

## 2021-11-12 NOTE — Telephone Encounter (Signed)
Called and spoke w Loma Sousa at Dr. Darien Ramus office and adv per Dr. Angelena Form it is ok to try higher dose of metoprolol as the patient's heart rate on recent recordings was not bradycardic.

## 2021-11-20 DIAGNOSIS — E669 Obesity, unspecified: Secondary | ICD-10-CM | POA: Diagnosis not present

## 2021-11-20 DIAGNOSIS — E1165 Type 2 diabetes mellitus with hyperglycemia: Secondary | ICD-10-CM | POA: Diagnosis not present

## 2021-11-20 DIAGNOSIS — Z794 Long term (current) use of insulin: Secondary | ICD-10-CM | POA: Diagnosis not present

## 2021-11-20 DIAGNOSIS — I2510 Atherosclerotic heart disease of native coronary artery without angina pectoris: Secondary | ICD-10-CM | POA: Diagnosis not present

## 2021-11-28 ENCOUNTER — Other Ambulatory Visit (HOSPITAL_COMMUNITY): Payer: Self-pay

## 2021-11-29 ENCOUNTER — Other Ambulatory Visit (HOSPITAL_COMMUNITY): Payer: Self-pay

## 2021-12-05 ENCOUNTER — Other Ambulatory Visit (HOSPITAL_COMMUNITY): Payer: Self-pay

## 2021-12-06 ENCOUNTER — Other Ambulatory Visit (HOSPITAL_COMMUNITY): Payer: Self-pay

## 2021-12-06 MED ORDER — FREESTYLE LIBRE 14 DAY SENSOR MISC
11 refills | Status: DC
  Filled 2021-12-06: qty 2, 28d supply, fill #0
  Filled 2022-01-06: qty 2, 28d supply, fill #1
  Filled 2022-02-05: qty 2, 28d supply, fill #2
  Filled 2022-02-28: qty 2, 28d supply, fill #3
  Filled 2022-03-28: qty 2, 28d supply, fill #4
  Filled 2022-04-29: qty 2, 28d supply, fill #5
  Filled 2022-06-12: qty 2, 28d supply, fill #6
  Filled 2022-07-14: qty 2, 28d supply, fill #7
  Filled 2022-07-31 – 2022-08-14 (×2): qty 2, 28d supply, fill #8
  Filled 2022-09-15: qty 2, 28d supply, fill #9
  Filled 2022-10-15: qty 2, 28d supply, fill #10
  Filled 2022-11-12: qty 2, 28d supply, fill #11

## 2021-12-09 ENCOUNTER — Other Ambulatory Visit: Payer: Self-pay

## 2021-12-09 ENCOUNTER — Other Ambulatory Visit (HOSPITAL_COMMUNITY): Payer: Self-pay

## 2021-12-10 ENCOUNTER — Other Ambulatory Visit (HOSPITAL_COMMUNITY): Payer: Self-pay

## 2021-12-10 DIAGNOSIS — E1142 Type 2 diabetes mellitus with diabetic polyneuropathy: Secondary | ICD-10-CM | POA: Diagnosis not present

## 2021-12-10 MED ORDER — METOPROLOL TARTRATE 50 MG PO TABS
ORAL_TABLET | ORAL | 2 refills | Status: DC
  Filled 2021-12-10: qty 180, 90d supply, fill #0
  Filled 2022-04-02: qty 180, 90d supply, fill #1
  Filled 2022-07-24: qty 180, 90d supply, fill #2

## 2021-12-10 MED ORDER — METOPROLOL TARTRATE 50 MG PO TABS: ORAL_TABLET | ORAL | 0 refills | Status: DC

## 2021-12-12 ENCOUNTER — Other Ambulatory Visit (HOSPITAL_COMMUNITY): Payer: Self-pay

## 2021-12-13 ENCOUNTER — Other Ambulatory Visit (HOSPITAL_COMMUNITY): Payer: Self-pay

## 2021-12-19 ENCOUNTER — Other Ambulatory Visit: Payer: Self-pay | Admitting: Cardiovascular Disease

## 2021-12-19 ENCOUNTER — Other Ambulatory Visit (HOSPITAL_COMMUNITY): Payer: Self-pay

## 2021-12-19 MED ORDER — AMLODIPINE BESYLATE 5 MG PO TABS
ORAL_TABLET | Freq: Every day | ORAL | 2 refills | Status: DC
  Filled 2021-12-19: qty 90, 90d supply, fill #0
  Filled 2022-03-28: qty 90, 90d supply, fill #1
  Filled 2022-07-01: qty 90, 90d supply, fill #2

## 2021-12-30 ENCOUNTER — Other Ambulatory Visit (HOSPITAL_COMMUNITY): Payer: Self-pay

## 2021-12-30 ENCOUNTER — Other Ambulatory Visit: Payer: Self-pay | Admitting: Cardiovascular Disease

## 2021-12-30 MED ORDER — EZETIMIBE 10 MG PO TABS
ORAL_TABLET | Freq: Every day | ORAL | 3 refills | Status: DC
Start: 2021-12-30 — End: 2022-12-30
  Filled 2021-12-30: qty 90, 90d supply, fill #0
  Filled 2022-04-01: qty 90, 90d supply, fill #1
  Filled 2022-07-08: qty 90, 90d supply, fill #2
  Filled 2022-10-08: qty 90, 90d supply, fill #3

## 2021-12-31 ENCOUNTER — Other Ambulatory Visit (HOSPITAL_COMMUNITY): Payer: Self-pay

## 2022-01-03 ENCOUNTER — Other Ambulatory Visit (HOSPITAL_COMMUNITY): Payer: Self-pay

## 2022-01-06 ENCOUNTER — Other Ambulatory Visit (HOSPITAL_COMMUNITY): Payer: Self-pay

## 2022-01-14 ENCOUNTER — Other Ambulatory Visit (HOSPITAL_COMMUNITY): Payer: Self-pay

## 2022-01-15 ENCOUNTER — Other Ambulatory Visit (HOSPITAL_COMMUNITY): Payer: Self-pay

## 2022-01-17 ENCOUNTER — Other Ambulatory Visit (HOSPITAL_COMMUNITY): Payer: Self-pay

## 2022-01-17 MED ORDER — COLESEVELAM HCL 625 MG PO TABS
ORAL_TABLET | ORAL | 2 refills | Status: DC
  Filled 2022-01-17: qty 180, 90d supply, fill #0
  Filled 2022-04-21: qty 180, 90d supply, fill #1
  Filled 2022-07-24: qty 180, 90d supply, fill #2

## 2022-01-30 ENCOUNTER — Other Ambulatory Visit (HOSPITAL_COMMUNITY): Payer: Self-pay

## 2022-01-30 ENCOUNTER — Other Ambulatory Visit: Payer: Self-pay | Admitting: Cardiovascular Disease

## 2022-01-31 ENCOUNTER — Other Ambulatory Visit (HOSPITAL_COMMUNITY): Payer: Self-pay

## 2022-01-31 MED ORDER — ROSUVASTATIN CALCIUM 40 MG PO TABS
ORAL_TABLET | Freq: Every day | ORAL | 2 refills | Status: DC
  Filled 2022-01-31: qty 90, 90d supply, fill #0
  Filled 2022-05-01: qty 90, 90d supply, fill #1
  Filled 2022-08-11: qty 90, 90d supply, fill #2

## 2022-01-31 MED ORDER — JARDIANCE 25 MG PO TABS
25.0000 mg | ORAL_TABLET | Freq: Every day | ORAL | 3 refills | Status: DC
  Filled 2022-01-31: qty 90, 90d supply, fill #0
  Filled 2022-05-01: qty 90, 90d supply, fill #1
  Filled 2022-08-11: qty 90, 90d supply, fill #2
  Filled 2022-11-27: qty 90, 90d supply, fill #3

## 2022-02-03 ENCOUNTER — Other Ambulatory Visit (HOSPITAL_COMMUNITY): Payer: Self-pay

## 2022-02-05 ENCOUNTER — Other Ambulatory Visit (HOSPITAL_COMMUNITY): Payer: Self-pay

## 2022-02-10 ENCOUNTER — Other Ambulatory Visit (HOSPITAL_COMMUNITY): Payer: Self-pay

## 2022-02-11 ENCOUNTER — Other Ambulatory Visit (HOSPITAL_COMMUNITY): Payer: Self-pay

## 2022-02-11 MED ORDER — INSULIN PEN NEEDLE 32G X 4 MM MISC
1 refills | Status: DC
  Filled 2022-02-11: qty 100, 50d supply, fill #0
  Filled 2022-03-28: qty 200, 90d supply, fill #0
  Filled 2022-09-04: qty 200, 90d supply, fill #1

## 2022-02-12 ENCOUNTER — Other Ambulatory Visit (HOSPITAL_COMMUNITY): Payer: Self-pay

## 2022-02-14 ENCOUNTER — Other Ambulatory Visit (HOSPITAL_COMMUNITY): Payer: Self-pay

## 2022-02-15 ENCOUNTER — Other Ambulatory Visit (HOSPITAL_COMMUNITY): Payer: Self-pay

## 2022-02-18 DIAGNOSIS — Z794 Long term (current) use of insulin: Secondary | ICD-10-CM | POA: Diagnosis not present

## 2022-02-18 DIAGNOSIS — E669 Obesity, unspecified: Secondary | ICD-10-CM | POA: Diagnosis not present

## 2022-02-18 DIAGNOSIS — E1165 Type 2 diabetes mellitus with hyperglycemia: Secondary | ICD-10-CM | POA: Diagnosis not present

## 2022-02-18 DIAGNOSIS — I2510 Atherosclerotic heart disease of native coronary artery without angina pectoris: Secondary | ICD-10-CM | POA: Diagnosis not present

## 2022-02-25 DIAGNOSIS — I739 Peripheral vascular disease, unspecified: Secondary | ICD-10-CM | POA: Diagnosis not present

## 2022-02-28 ENCOUNTER — Other Ambulatory Visit (HOSPITAL_COMMUNITY): Payer: Self-pay

## 2022-03-01 ENCOUNTER — Other Ambulatory Visit (HOSPITAL_COMMUNITY): Payer: Self-pay

## 2022-03-28 ENCOUNTER — Other Ambulatory Visit (HOSPITAL_COMMUNITY): Payer: Self-pay

## 2022-03-28 MED ORDER — TRULICITY 4.5 MG/0.5ML ~~LOC~~ SOAJ
SUBCUTANEOUS | 3 refills | Status: DC
  Filled 2022-03-28: qty 6, 84d supply, fill #0
  Filled 2022-07-14: qty 6, 84d supply, fill #1
  Filled 2022-12-12: qty 6, 84d supply, fill #2

## 2022-03-31 ENCOUNTER — Other Ambulatory Visit (HOSPITAL_COMMUNITY): Payer: Self-pay

## 2022-04-01 ENCOUNTER — Other Ambulatory Visit (HOSPITAL_COMMUNITY): Payer: Self-pay

## 2022-04-02 ENCOUNTER — Other Ambulatory Visit (HOSPITAL_COMMUNITY): Payer: Self-pay

## 2022-04-21 ENCOUNTER — Other Ambulatory Visit (HOSPITAL_COMMUNITY): Payer: Self-pay

## 2022-04-29 ENCOUNTER — Other Ambulatory Visit (HOSPITAL_COMMUNITY): Payer: Self-pay

## 2022-04-30 ENCOUNTER — Other Ambulatory Visit (HOSPITAL_COMMUNITY): Payer: Self-pay

## 2022-05-01 ENCOUNTER — Other Ambulatory Visit (HOSPITAL_COMMUNITY): Payer: Self-pay

## 2022-05-05 DIAGNOSIS — R202 Paresthesia of skin: Secondary | ICD-10-CM | POA: Diagnosis not present

## 2022-05-05 DIAGNOSIS — E785 Hyperlipidemia, unspecified: Secondary | ICD-10-CM | POA: Diagnosis not present

## 2022-05-05 DIAGNOSIS — I10 Essential (primary) hypertension: Secondary | ICD-10-CM | POA: Diagnosis not present

## 2022-05-05 DIAGNOSIS — E1165 Type 2 diabetes mellitus with hyperglycemia: Secondary | ICD-10-CM | POA: Diagnosis not present

## 2022-05-05 DIAGNOSIS — E559 Vitamin D deficiency, unspecified: Secondary | ICD-10-CM | POA: Diagnosis not present

## 2022-05-22 ENCOUNTER — Other Ambulatory Visit (HOSPITAL_COMMUNITY): Payer: Self-pay

## 2022-05-22 DIAGNOSIS — E1165 Type 2 diabetes mellitus with hyperglycemia: Secondary | ICD-10-CM | POA: Diagnosis not present

## 2022-05-22 DIAGNOSIS — I2510 Atherosclerotic heart disease of native coronary artery without angina pectoris: Secondary | ICD-10-CM | POA: Diagnosis not present

## 2022-05-22 DIAGNOSIS — Z794 Long term (current) use of insulin: Secondary | ICD-10-CM | POA: Diagnosis not present

## 2022-05-22 DIAGNOSIS — E669 Obesity, unspecified: Secondary | ICD-10-CM | POA: Diagnosis not present

## 2022-05-22 MED ORDER — ERGOCALCIFEROL 1.25 MG (50000 UT) PO CAPS
ORAL_CAPSULE | ORAL | 0 refills | Status: DC
  Filled 2022-05-22: qty 8, 60d supply, fill #0

## 2022-06-12 ENCOUNTER — Other Ambulatory Visit (HOSPITAL_COMMUNITY): Payer: Self-pay

## 2022-06-13 ENCOUNTER — Other Ambulatory Visit (HOSPITAL_COMMUNITY): Payer: Self-pay

## 2022-06-20 ENCOUNTER — Other Ambulatory Visit (HOSPITAL_COMMUNITY): Payer: Self-pay

## 2022-06-20 MED ORDER — SILDENAFIL CITRATE 100 MG PO TABS
50.0000 mg | ORAL_TABLET | ORAL | 5 refills | Status: DC
  Filled 2022-06-20: qty 8, 30d supply, fill #0
  Filled 2022-10-15: qty 8, 30d supply, fill #1
  Filled 2022-12-04: qty 8, 30d supply, fill #2
  Filled 2023-01-20: qty 8, 30d supply, fill #3
  Filled 2023-04-30: qty 8, 30d supply, fill #4
  Filled 2023-06-16: qty 8, 30d supply, fill #5

## 2022-07-01 ENCOUNTER — Other Ambulatory Visit (HOSPITAL_COMMUNITY): Payer: Self-pay

## 2022-07-08 ENCOUNTER — Other Ambulatory Visit (HOSPITAL_COMMUNITY): Payer: Self-pay

## 2022-07-09 ENCOUNTER — Other Ambulatory Visit (HOSPITAL_COMMUNITY): Payer: Self-pay

## 2022-07-14 ENCOUNTER — Other Ambulatory Visit (HOSPITAL_COMMUNITY): Payer: Self-pay

## 2022-07-15 ENCOUNTER — Other Ambulatory Visit (HOSPITAL_COMMUNITY): Payer: Self-pay

## 2022-07-25 ENCOUNTER — Other Ambulatory Visit (HOSPITAL_COMMUNITY): Payer: Self-pay

## 2022-07-28 ENCOUNTER — Other Ambulatory Visit (HOSPITAL_COMMUNITY): Payer: Self-pay

## 2022-07-31 ENCOUNTER — Other Ambulatory Visit (HOSPITAL_COMMUNITY): Payer: Self-pay

## 2022-08-01 DIAGNOSIS — E113393 Type 2 diabetes mellitus with moderate nonproliferative diabetic retinopathy without macular edema, bilateral: Secondary | ICD-10-CM | POA: Diagnosis not present

## 2022-08-12 ENCOUNTER — Other Ambulatory Visit (HOSPITAL_COMMUNITY): Payer: Self-pay

## 2022-08-14 ENCOUNTER — Other Ambulatory Visit (HOSPITAL_COMMUNITY): Payer: Self-pay

## 2022-08-15 ENCOUNTER — Other Ambulatory Visit (HOSPITAL_COMMUNITY): Payer: Self-pay

## 2022-08-21 ENCOUNTER — Other Ambulatory Visit (HOSPITAL_COMMUNITY): Payer: Self-pay

## 2022-08-21 DIAGNOSIS — E669 Obesity, unspecified: Secondary | ICD-10-CM | POA: Diagnosis not present

## 2022-08-21 DIAGNOSIS — E1165 Type 2 diabetes mellitus with hyperglycemia: Secondary | ICD-10-CM | POA: Diagnosis not present

## 2022-08-21 DIAGNOSIS — I2510 Atherosclerotic heart disease of native coronary artery without angina pectoris: Secondary | ICD-10-CM | POA: Diagnosis not present

## 2022-08-21 MED ORDER — JARDIANCE 25 MG PO TABS
25.0000 mg | ORAL_TABLET | Freq: Every day | ORAL | 4 refills | Status: DC
  Filled 2022-08-21: qty 90, 90d supply, fill #0

## 2022-08-22 ENCOUNTER — Other Ambulatory Visit (HOSPITAL_COMMUNITY): Payer: Self-pay

## 2022-08-23 ENCOUNTER — Other Ambulatory Visit (HOSPITAL_COMMUNITY): Payer: Self-pay

## 2022-08-25 ENCOUNTER — Other Ambulatory Visit (HOSPITAL_COMMUNITY): Payer: Self-pay

## 2022-09-04 ENCOUNTER — Other Ambulatory Visit (HOSPITAL_COMMUNITY): Payer: Self-pay

## 2022-09-05 ENCOUNTER — Other Ambulatory Visit (HOSPITAL_COMMUNITY): Payer: Self-pay

## 2022-09-11 NOTE — Progress Notes (Signed)
Office Visit    Patient Name: Philip Black Date of Encounter: 09/12/2022  PCP:  London Pepper, [facility]  Cardiologist:  Lauree Chandler, MD  Advanced Practice Provider:  No care team member to display Electrophysiologist:  None   HPI    Philip Black is a 66 y.o. male with a past medical history of CAD, hypertension, hyperlipidemia, diabetes mellitus, sleep apnea presents today for follow-up visit.  In May 2009 he had an anterior MI treated with drug-eluting stent in the LAD.  Seen in the office July 2017 and had symptoms consistent with unstable angina.  Cardiac cath August 2017 with severe stenosis in the proximal circumflex treated with DES.  Severe stenosis, small caliber nondominant RCA treated medically.  LV function normal by LV gram in August 2017.  He had not tolerated beta-blockers due to bradycardia.  He was last seen 09/17/2021 and at that time he denied any significant CV symptoms.  Today, he admits to not eating right and drinking a lot of soda. He is trying to cut back. He gets his BP checked at work and sometimes it is elevated. We have encouraged him to check his BP at home. He recently had a URI and had some wheezing and chest pains but those has subsided with the infection resolving. If these symptoms arise without URI he is to call our office. Right now, will hold off on ischemic workup. BP elevated initially but on repeat was 135/80. He took his blood pressure medications right before he left the house for his appointment.   Reports no shortness of breath nor dyspnea on exertion. Reports no chest pain, pressure, or tightness. No edema, orthopnea, PND. Reports no palpitations.    Past Medical History    Past Medical History:  Diagnosis Date   Arthritis    "shoulders, right hip" (05/07/2016)   Back pain    CAD S/P percutaneous coronary angioplasty    a. DES to LAD in 2009 b. cath: 05/2016 w/ patent stent to LAD, 99% stenosis  Prox Cx (treated w/ DES), and 99% stenosis of small caliber non-dominant RCA (no intervention performed).   Carpal tunnel syndrome    Coronary artery disease involving native coronary artery of native heart with unstable angina pectoris ([place]) 06/26/2010   Qualifier: Diagnosis of  By: Olevia Perches, MD, Glenetta Hew    High cholesterol    Hip pain    History of heart attack    Hypertension    Hypertension associated with diabetes ([place]) 06/26/2010   Qualifier: Diagnosis of  By: Olevia Perches, MD, Glenetta Hew    Metabolic syndrome    OSA (obstructive sleep apnea)    "used mask; lost weight; stopped using mask" (05/07/2016)   Tinnitus    Type II diabetes mellitus ([place])    Unstable angina ([place]) 05/07/2016   Past Surgical History:  Procedure Laterality Date   CARDIAC CATHETERIZATION N/A 05/07/2016   Procedure: Left Heart Cath and Coronary Angiography;  Surgeon: Burnell Blanks, MD;  Location: [place] [facility];  Service: Cardiovascular;  Laterality: N/A;   CARDIAC CATHETERIZATION N/A 05/07/2016   Procedure: Coronary Stent Intervention;  Surgeon: Burnell Blanks, MD;  Location: [place] [facility];  Service: Cardiovascular;  Laterality: N/A;   CORONARY ANGIOPLASTY WITH STENT PLACEMENT  02/2008; 05/07/2016   TONSILLECTOMY  ~ 1969   WRIST SURGERY      Allergies  Allergies  Allergen Reactions   Canagliflozin-Metformin Hcl Other (See  Comments)    EKGs/Labs/Other Studies Reviewed:   The following studies were reviewed today:  Echo 10/17/21 IMPRESSIONS     1. Left ventricular ejection fraction, by estimation, is 60 to 65%. The  left ventricle has normal function. The left ventricle has no regional  wall motion abnormalities. There is moderate left ventricular hypertrophy.  Left ventricular diastolic  parameters are consistent with Grade I diastolic dysfunction (impaired  relaxation). The average left ventricular global longitudinal strain is  -17.0 %. The global longitudinal strain  is normal.   2. Right ventricular systolic function is normal. The right ventricular  size is normal.   3. The mitral valve is grossly normal. Trivial mitral valve  regurgitation.   4. The aortic valve is normal in structure. Aortic valve regurgitation is  not visualized.   FINDINGS   Left Ventricle: Left ventricular ejection fraction, by estimation, is 60  to 65%. The left ventricle has normal function. The left ventricle has no  regional wall motion abnormalities. The average left ventricular global  longitudinal strain is -17.0 %.  The global longitudinal strain is normal. 3D left ventricular ejection  fraction analysis performed but not reported based on interpreter  judgement due to suboptimal tracking. The left ventricular internal cavity  size was normal in size. There is moderate   left ventricular hypertrophy. Left ventricular diastolic parameters are  consistent with Grade I diastolic dysfunction (impaired relaxation).   Right Ventricle: The right ventricular size is normal. Right vetricular  wall thickness was not well visualized. Right ventricular systolic  function is normal.   Left Atrium: Left atrial size was normal in size.   Right Atrium: Right atrial size was normal in size.   Pericardium: There is no evidence of pericardial effusion.   Mitral Valve: The mitral valve is grossly normal. Trivial mitral valve  regurgitation.   Tricuspid Valve: The tricuspid valve is grossly normal. Tricuspid valve  regurgitation is trivial.   Aortic Valve: The aortic valve is normal in structure. Aortic valve  regurgitation is not visualized.   Pulmonic Valve: The pulmonic valve was normal in structure. Pulmonic valve  regurgitation is not visualized.   Aorta: The aortic root and ascending aorta are structurally normal, with  no evidence of dilitation.   IAS/Shunts: The atrial septum is grossly normal.    EKG:  EKG is  ordered today.  The ekg ordered today demonstrates  NSR rate 88 bpm  Recent Labs: No results found for requested labs within last 365 days.  Recent Lipid Panel    Component Value Date/Time   CHOL 152 08/03/2018 0756   TRIG 83 08/03/2018 0756   HDL 66 08/03/2018 0756   CHOLHDL 2.3 08/03/2018 0756   CHOLHDL 2.2 10/24/2013 1152   VLDL 12 10/24/2013 1152   LDLCALC 69 08/03/2018 0756   LDLDIRECT 64 08/08/2009 0000    Home Medications   Current Meds  Medication Sig   amLODipine (NORVASC) 5 MG tablet TAKE 1 TABLET BY MOUTH ONCE DAILY   aspirin EC 81 MG tablet Take 1 tablet (81 mg total) daily by mouth.   colesevelam (WELCHOL) 625 MG tablet TAKE 2 TABLETS BY MOUTH ONCE DAILY WITH A MEAL   Continuous Blood Gluc Sensor (FREESTYLE LIBRE 14 DAY SENSOR) MISC CHANGE SENSOR EVERY 14 DAYS AS DIRECTED   COVID-19 mRNA Vac-TriS, Pfizer, (PFIZER-BIONT COVID-19 VAC-TRIS) SUSP injection Inject into the muscle.   Dulaglutide (TRULICITY) 4.5 EX/9.3ZJ SOPN Inject 4.5 mg under the skin once a week   empagliflozin (JARDIANCE)  25 MG TABS tablet Take 1 tablet (25 mg total) by mouth daily.   ergocalciferol (VITAMIN D2) 1.25 MG (50000 UT) capsule Take 1 capsule by mouth  once a week until 07/22/2022   ezetimibe (ZETIA) 10 MG tablet TAKE 1 TABLET BY MOUTH ONCE A DAY   insulin aspart protamine - aspart (NOVOLOG MIX 70/30 FLEXPEN) (70-30) 100 UNIT/ML FlexPen Inject 80 units under the skin at breakfast, & 80 units under the skin at evening meal (twice a day.)   Insulin Pen Needle 32G X 4 MM MISC Use as directed 2 times a day   lisinopril (ZESTRIL) 40 MG tablet Take 1 tablet (40 mg total) by mouth daily.   methocarbamol (ROBAXIN) 500 MG tablet Take 1 tablet by mouth 4 times a day.   Multiple Vitamins-Minerals (MENS MULTIPLUS PO) Take by mouth.   nitroGLYCERIN (NITROSTAT) 0.4 MG SL tablet Place 1 tablet (0.4 mg total) under the tongue every 5 (five) minutes as needed for chest pain (X 3 DOSES MAX).   predniSONE (STERAPRED UNI-PAK 21 TAB) 10 MG (21) TBPK tablet Take as  directed within package instructions.   rosuvastatin (CRESTOR) 40 MG tablet TAKE 1 TABLET BY MOUTH DAILY.   sildenafil (VIAGRA) 100 MG tablet Take 0.5-1 tablets (50-100 mg total) by mouth 30-60 minutes prior to intercourse as directed.   TRULICITY 5.45 GY/5.6LS SOPN Inject 0.75 mLs into the skin once a week.   Vitamin D, Ergocalciferol, (DRISDOL) 1.25 MG (50000 UT) CAPS capsule Take 1 capsule (50,000 Units total) by mouth every 7 (seven) days.   [DISCONTINUED] colesevelam (WELCHOL) 625 MG tablet Take 1,250 mg by mouth daily.   [DISCONTINUED] colesevelam (WELCHOL) 625 MG tablet TAKE 2 TABLETS BY MOUTH ONCE DAILY WITH A MEAL   [DISCONTINUED] empagliflozin (JARDIANCE) 10 MG TABS tablet Take 10 mg by mouth daily.   [DISCONTINUED] empagliflozin (JARDIANCE) 25 MG TABS tablet Take 1 tablet (25 mg total) by mouth daily.   [DISCONTINUED] insulin aspart protamine- aspart (NOVOLOG MIX 70/30) (70-30) 100 UNIT/ML injection Inject 54 Units into the skin. 54 units in am - 54 units in pm   [DISCONTINUED] metoprolol tartrate (LOPRESSOR) 50 MG tablet Take 1 tablet by mouth twice a day with food.   [DISCONTINUED] metoprolol tartrate (LOPRESSOR) 50 MG tablet Take 1 tablet by mouth 2 times a day with food     Review of Systems      All other systems reviewed and are otherwise negative except as noted above.  Physical Exam    VS:  BP 135/80   Pulse 88   Ht '5\' 10"'$  (1.778 m)   Wt 257 lb (116.6 kg)   SpO2 95%   BMI 36.88 kg/m  , BMI Body mass index is 36.88 kg/m.  Wt Readings from Last 3 Encounters:  09/12/22 257 lb (116.6 kg)  09/17/21 251 lb 6.4 oz (114 kg)  05/22/20 255 lb (115.7 kg)     GEN: Well nourished, well developed, in no acute distress. HEENT: normal. Neck: Supple, no JVD, carotid bruits, or masses. Cardiac: RRR, no murmurs, rubs, or gallops. No clubbing, cyanosis, edema.  Radials/PT 2+ and equal bilaterally.  Respiratory:  Respirations regular and unlabored, clear to auscultation  bilaterally. GI: Soft, nontender, nondistended. MS: No deformity or atrophy. Skin: Warm and dry, no rash. Neuro:  Strength and sensation are intact. Psych: Normal affect.  Assessment & Plan    CAD without angina -had one episode of chest pain with URI symptoms -non exertional -some wheezing with illness but no  SOB/DOE  Hypertension -Better controlled on retake 135/80 -continue Norvasc, Lisinopril, and metoprolol -monitor BP at home an hour after morning medications -call us if consistently > 350 systolic  Hyperlipidemia -LDL above goal at 97 -would recommend repeat lipid panel with PCP in January -continue zetia and crestor for now        Disposition: Follow up 10 months with Lauree Chandler, MD or APP.  Signed, Elgie Collard, PA-C 09/12/2022, 11:07 AM [place]

## 2022-09-12 ENCOUNTER — Encounter: Payer: Self-pay | Admitting: Physician Assistant

## 2022-09-12 ENCOUNTER — Ambulatory Visit: Payer: Federal, State, Local not specified - PPO | Attending: Physician Assistant | Admitting: Physician Assistant

## 2022-09-12 ENCOUNTER — Other Ambulatory Visit (HOSPITAL_COMMUNITY): Payer: Self-pay

## 2022-09-12 VITALS — BP 135/80 | HR 88 | Ht 70.0 in | Wt 257.0 lb

## 2022-09-12 DIAGNOSIS — E785 Hyperlipidemia, unspecified: Secondary | ICD-10-CM | POA: Diagnosis not present

## 2022-09-12 DIAGNOSIS — I2510 Atherosclerotic heart disease of native coronary artery without angina pectoris: Secondary | ICD-10-CM | POA: Diagnosis not present

## 2022-09-12 DIAGNOSIS — I10 Essential (primary) hypertension: Secondary | ICD-10-CM

## 2022-09-12 MED ORDER — AMLODIPINE BESYLATE 5 MG PO TABS
5.0000 mg | ORAL_TABLET | Freq: Every day | ORAL | 3 refills | Status: DC
  Filled 2022-09-12: qty 90, 90d supply, fill #0
  Filled 2022-12-22: qty 90, 90d supply, fill #1
  Filled 2023-03-30: qty 90, 90d supply, fill #2
  Filled 2023-07-06: qty 90, 90d supply, fill #3

## 2022-09-12 MED ORDER — LISINOPRIL 40 MG PO TABS
40.0000 mg | ORAL_TABLET | Freq: Every day | ORAL | 3 refills | Status: DC
  Filled 2022-09-12: qty 90, 90d supply, fill #0
  Filled 2022-12-22: qty 90, 90d supply, fill #1
  Filled 2023-03-23: qty 90, 90d supply, fill #2
  Filled 2023-06-23: qty 90, 90d supply, fill #3

## 2022-09-12 NOTE — Patient Instructions (Signed)
Medication Instructions:  Your physician recommends that you continue on your current medications as directed. Please refer to the Current Medication list given to you today.  *If you need a refill on your cardiac medications before your next appointment, please call your pharmacy*   Lab Work: None If you have labs (blood work) drawn today and your tests are completely normal, you will receive your results only by: Corn (if you have MyChart) OR A paper copy in the mail If you have any lab test that is abnormal or we need to change your treatment, we will call you to review the results.   Follow-Up: At [facility], you and your health needs are our priority.  As part of our continuing mission to provide you with exceptional heart care, we have created designated Provider Care Teams.  These Care Teams include your primary Cardiologist (physician) and Advanced Practice Providers (APPs -  Physician Assistants and Nurse Practitioners) who all work together to provide you with the care you need, when you need it.  Your next appointment:   October 2024  The format for your next appointment:   In Person  Provider:   Lauree Chandler, MD    Important Information About Sugar

## 2022-09-15 ENCOUNTER — Other Ambulatory Visit (HOSPITAL_COMMUNITY): Payer: Self-pay

## 2022-10-15 ENCOUNTER — Other Ambulatory Visit (HOSPITAL_COMMUNITY): Payer: Self-pay

## 2022-10-16 ENCOUNTER — Other Ambulatory Visit (HOSPITAL_COMMUNITY): Payer: Self-pay

## 2022-10-24 ENCOUNTER — Other Ambulatory Visit (HOSPITAL_COMMUNITY): Payer: Self-pay

## 2022-10-29 ENCOUNTER — Other Ambulatory Visit (HOSPITAL_COMMUNITY): Payer: Self-pay

## 2022-10-29 MED ORDER — COLESEVELAM HCL 625 MG PO TABS
1250.0000 mg | ORAL_TABLET | Freq: Every day | ORAL | 2 refills | Status: DC
  Filled 2022-10-29: qty 180, 90d supply, fill #0
  Filled 2023-01-31: qty 180, 90d supply, fill #1
  Filled 2023-05-01: qty 180, 90d supply, fill #2

## 2022-11-10 ENCOUNTER — Other Ambulatory Visit: Payer: Self-pay | Admitting: Cardiovascular Disease

## 2022-11-10 ENCOUNTER — Other Ambulatory Visit (HOSPITAL_COMMUNITY): Payer: Self-pay

## 2022-11-10 MED ORDER — ROSUVASTATIN CALCIUM 40 MG PO TABS
40.0000 mg | ORAL_TABLET | Freq: Every day | ORAL | 2 refills | Status: DC
  Filled 2022-11-10: qty 90, 90d supply, fill #0
  Filled 2023-02-09: qty 90, 90d supply, fill #1
  Filled 2023-05-19: qty 90, 90d supply, fill #2

## 2022-11-12 ENCOUNTER — Other Ambulatory Visit (HOSPITAL_COMMUNITY): Payer: Self-pay

## 2022-11-13 ENCOUNTER — Other Ambulatory Visit (HOSPITAL_COMMUNITY): Payer: Self-pay

## 2022-11-18 ENCOUNTER — Other Ambulatory Visit (HOSPITAL_COMMUNITY): Payer: Self-pay

## 2022-11-19 ENCOUNTER — Other Ambulatory Visit (HOSPITAL_COMMUNITY): Payer: Self-pay

## 2022-11-19 MED ORDER — METOPROLOL TARTRATE 50 MG PO TABS
50.0000 mg | ORAL_TABLET | Freq: Two times a day (BID) | ORAL | 0 refills | Status: DC
  Filled 2022-11-19: qty 180, 90d supply, fill #0

## 2022-12-02 DIAGNOSIS — H40013 Open angle with borderline findings, low risk, bilateral: Secondary | ICD-10-CM | POA: Diagnosis not present

## 2022-12-08 DIAGNOSIS — I2510 Atherosclerotic heart disease of native coronary artery without angina pectoris: Secondary | ICD-10-CM | POA: Diagnosis not present

## 2022-12-08 DIAGNOSIS — E113293 Type 2 diabetes mellitus with mild nonproliferative diabetic retinopathy without macular edema, bilateral: Secondary | ICD-10-CM | POA: Diagnosis not present

## 2022-12-08 DIAGNOSIS — E785 Hyperlipidemia, unspecified: Secondary | ICD-10-CM | POA: Diagnosis not present

## 2022-12-08 DIAGNOSIS — E559 Vitamin D deficiency, unspecified: Secondary | ICD-10-CM | POA: Diagnosis not present

## 2022-12-08 DIAGNOSIS — Z0000 Encounter for general adult medical examination without abnormal findings: Secondary | ICD-10-CM | POA: Diagnosis not present

## 2022-12-08 DIAGNOSIS — I10 Essential (primary) hypertension: Secondary | ICD-10-CM | POA: Diagnosis not present

## 2022-12-08 DIAGNOSIS — Z23 Encounter for immunization: Secondary | ICD-10-CM | POA: Diagnosis not present

## 2022-12-08 DIAGNOSIS — Z125 Encounter for screening for malignant neoplasm of prostate: Secondary | ICD-10-CM | POA: Diagnosis not present

## 2022-12-12 ENCOUNTER — Other Ambulatory Visit (HOSPITAL_COMMUNITY): Payer: Self-pay

## 2022-12-12 MED ORDER — FREESTYLE LIBRE 14 DAY SENSOR MISC
11 refills | Status: AC
  Filled 2022-12-12: qty 2, 28d supply, fill #0
  Filled 2023-01-20: qty 2, 28d supply, fill #1

## 2022-12-15 ENCOUNTER — Other Ambulatory Visit (HOSPITAL_COMMUNITY): Payer: Self-pay

## 2022-12-15 MED ORDER — ERGOCALCIFEROL 1.25 MG (50000 UT) PO CAPS
1.0000 | ORAL_CAPSULE | ORAL | 0 refills | Status: DC
  Filled 2022-12-15: qty 8, 56d supply, fill #0

## 2022-12-16 ENCOUNTER — Other Ambulatory Visit (HOSPITAL_COMMUNITY): Payer: Self-pay

## 2022-12-30 ENCOUNTER — Other Ambulatory Visit (HOSPITAL_COMMUNITY): Payer: Self-pay

## 2022-12-30 ENCOUNTER — Other Ambulatory Visit: Payer: Self-pay | Admitting: Cardiovascular Disease

## 2022-12-30 MED ORDER — EZETIMIBE 10 MG PO TABS
ORAL_TABLET | Freq: Every day | ORAL | 1 refills | Status: DC
  Filled 2022-12-30: qty 90, 90d supply, fill #0
  Filled 2023-03-30: qty 90, 90d supply, fill #1

## 2023-01-05 ENCOUNTER — Other Ambulatory Visit (HOSPITAL_COMMUNITY): Payer: Self-pay

## 2023-01-06 ENCOUNTER — Other Ambulatory Visit (HOSPITAL_COMMUNITY): Payer: Self-pay

## 2023-01-06 MED ORDER — NOVOLOG MIX 70/30 FLEXPEN (70-30) 100 UNIT/ML ~~LOC~~ SUPN
PEN_INJECTOR | SUBCUTANEOUS | 3 refills | Status: DC
  Filled 2023-01-06: qty 168, 88d supply, fill #0
  Filled 2023-08-13: qty 168, 88d supply, fill #1

## 2023-01-07 ENCOUNTER — Other Ambulatory Visit (HOSPITAL_COMMUNITY): Payer: Self-pay

## 2023-01-08 ENCOUNTER — Other Ambulatory Visit (HOSPITAL_COMMUNITY): Payer: Self-pay

## 2023-01-09 ENCOUNTER — Other Ambulatory Visit (HOSPITAL_COMMUNITY): Payer: Self-pay

## 2023-01-09 MED ORDER — TECHLITE PEN NEEDLES 32G X 4 MM MISC
5 refills | Status: DC
  Filled 2023-01-09 – 2023-05-04 (×2): qty 100, 50d supply, fill #0
  Filled 2023-08-13: qty 200, 90d supply, fill #1

## 2023-01-14 ENCOUNTER — Other Ambulatory Visit (HOSPITAL_COMMUNITY): Payer: Self-pay

## 2023-01-23 ENCOUNTER — Other Ambulatory Visit (HOSPITAL_COMMUNITY): Payer: Self-pay

## 2023-02-11 ENCOUNTER — Other Ambulatory Visit (HOSPITAL_COMMUNITY): Payer: Self-pay

## 2023-02-20 ENCOUNTER — Other Ambulatory Visit (HOSPITAL_COMMUNITY): Payer: Self-pay

## 2023-02-20 ENCOUNTER — Other Ambulatory Visit: Payer: Self-pay

## 2023-02-20 DIAGNOSIS — Z794 Long term (current) use of insulin: Secondary | ICD-10-CM | POA: Diagnosis not present

## 2023-02-20 DIAGNOSIS — E1165 Type 2 diabetes mellitus with hyperglycemia: Secondary | ICD-10-CM | POA: Diagnosis not present

## 2023-02-20 DIAGNOSIS — I2510 Atherosclerotic heart disease of native coronary artery without angina pectoris: Secondary | ICD-10-CM | POA: Diagnosis not present

## 2023-02-20 DIAGNOSIS — E669 Obesity, unspecified: Secondary | ICD-10-CM | POA: Diagnosis not present

## 2023-02-20 MED ORDER — FREESTYLE LIBRE 3 SENSOR MISC
4 refills | Status: AC
  Filled 2023-02-20: qty 2, 28d supply, fill #0
  Filled 2023-03-23: qty 2, 28d supply, fill #1
  Filled 2023-04-30: qty 2, 28d supply, fill #2
  Filled 2023-06-12: qty 2, 28d supply, fill #3
  Filled 2023-07-08 – 2023-07-21 (×3): qty 2, 28d supply, fill #4
  Filled 2023-08-19: qty 2, 28d supply, fill #5
  Filled 2023-09-14: qty 2, 28d supply, fill #6
  Filled 2023-10-09: qty 2, 28d supply, fill #7
  Filled 2023-11-12: qty 2, 28d supply, fill #8
  Filled 2023-12-17: qty 2, 28d supply, fill #9
  Filled 2024-01-08: qty 2, 28d supply, fill #10
  Filled 2024-02-08: qty 2, 28d supply, fill #11

## 2023-02-20 MED ORDER — FREESTYLE LIBRE 3 READER DEVI
0 refills | Status: AC
  Filled 2023-02-20: qty 1, 30d supply, fill #0

## 2023-02-21 ENCOUNTER — Other Ambulatory Visit (HOSPITAL_COMMUNITY): Payer: Self-pay

## 2023-02-24 ENCOUNTER — Other Ambulatory Visit (HOSPITAL_COMMUNITY): Payer: Self-pay

## 2023-02-24 MED ORDER — JARDIANCE 25 MG PO TABS
25.0000 mg | ORAL_TABLET | Freq: Every day | ORAL | 3 refills | Status: DC
  Filled 2023-02-24: qty 90, 90d supply, fill #0
  Filled 2023-05-19: qty 90, 90d supply, fill #1
  Filled 2023-08-22 – 2023-08-31 (×2): qty 90, 90d supply, fill #2
  Filled 2023-12-17: qty 90, 90d supply, fill #3

## 2023-03-05 ENCOUNTER — Other Ambulatory Visit (HOSPITAL_COMMUNITY): Payer: Self-pay

## 2023-03-10 ENCOUNTER — Other Ambulatory Visit (HOSPITAL_COMMUNITY): Payer: Self-pay

## 2023-03-10 MED ORDER — METOPROLOL TARTRATE 50 MG PO TABS
50.0000 mg | ORAL_TABLET | Freq: Two times a day (BID) | ORAL | 0 refills | Status: DC
  Filled 2023-03-10: qty 180, 90d supply, fill #0

## 2023-03-11 ENCOUNTER — Other Ambulatory Visit (HOSPITAL_COMMUNITY): Payer: Self-pay

## 2023-04-02 DIAGNOSIS — H35351 Cystoid macular degeneration, right eye: Secondary | ICD-10-CM | POA: Diagnosis not present

## 2023-04-16 DIAGNOSIS — I739 Peripheral vascular disease, unspecified: Secondary | ICD-10-CM | POA: Diagnosis not present

## 2023-04-30 ENCOUNTER — Other Ambulatory Visit (HOSPITAL_COMMUNITY): Payer: Self-pay

## 2023-04-30 ENCOUNTER — Other Ambulatory Visit: Payer: Self-pay

## 2023-04-30 MED ORDER — TRULICITY 4.5 MG/0.5ML ~~LOC~~ SOAJ
4.5000 mg | SUBCUTANEOUS | 3 refills | Status: DC
  Filled 2023-04-30: qty 6, 84d supply, fill #0
  Filled 2023-06-16: qty 6, 84d supply, fill #1

## 2023-05-01 ENCOUNTER — Other Ambulatory Visit: Payer: Self-pay

## 2023-05-01 ENCOUNTER — Other Ambulatory Visit (HOSPITAL_COMMUNITY): Payer: Self-pay

## 2023-05-01 MED ORDER — FREESTYLE LIBRE 3 READER DEVI
0 refills | Status: AC
  Filled 2023-05-01: qty 1, 90d supply, fill #0

## 2023-05-04 ENCOUNTER — Other Ambulatory Visit (HOSPITAL_COMMUNITY): Payer: Self-pay

## 2023-05-05 ENCOUNTER — Other Ambulatory Visit: Payer: Self-pay

## 2023-05-08 DIAGNOSIS — E669 Obesity, unspecified: Secondary | ICD-10-CM | POA: Diagnosis not present

## 2023-05-08 DIAGNOSIS — Z794 Long term (current) use of insulin: Secondary | ICD-10-CM | POA: Diagnosis not present

## 2023-05-08 DIAGNOSIS — I2510 Atherosclerotic heart disease of native coronary artery without angina pectoris: Secondary | ICD-10-CM | POA: Diagnosis not present

## 2023-05-08 DIAGNOSIS — E1165 Type 2 diabetes mellitus with hyperglycemia: Secondary | ICD-10-CM | POA: Diagnosis not present

## 2023-05-11 ENCOUNTER — Other Ambulatory Visit (HOSPITAL_COMMUNITY): Payer: Self-pay

## 2023-05-22 DIAGNOSIS — M1611 Unilateral primary osteoarthritis, right hip: Secondary | ICD-10-CM | POA: Diagnosis not present

## 2023-05-27 ENCOUNTER — Telehealth: Payer: Self-pay

## 2023-05-27 NOTE — Telephone Encounter (Signed)
Attempted to reach the patient on his home phone but no answer and no voicemail set up. Left message on mobile number requesting a call back.

## 2023-05-27 NOTE — Telephone Encounter (Signed)
...     Pre-operative Risk Assessment    Patient Name: Philip Black  DOB: [birth year] MRN: [id number]    LAST APPT 09/12/22 NO SCHEDULE APPT  Request for Surgical Clearance    Procedure:   RT TOTAL HIP ARTHROPLASTY  Date of Surgery:  Clearance 09/09/23                             DR     Surgeon:  DR Ollen Gross Surgeon's Group or Practice Name:  [facility] Phone number:  [phone] Fax number:  [phone]   Type of Clearance Requested:   - Medical  - Pharmacy:  Hold Aspirin     Type of Anesthesia:   CHOICE   Additional requests/questions:    Jola Babinski   05/27/2023, 1:36 PM

## 2023-05-27 NOTE — Telephone Encounter (Signed)
   Name: Philip Black  DOB: [birth year]  MRN: [id number]  Primary Cardiologist: Verne Carrow, MD   Preoperative team, please contact this patient and set up a phone call appointment for further preoperative risk assessment. Please obtain consent and complete medication review. Thank you for your help.  I confirm that guidance regarding antiplatelet and oral anticoagulation therapy has been completed and, if necessary, noted below.  Regarding ASA therapy, we recommend continuation of ASA throughout the perioperative period.      Napoleon Form, Leodis Rains, NP 05/27/2023, 3:13 PM [facility]

## 2023-05-28 NOTE — Telephone Encounter (Signed)
Pt answered and then hung up. I will call again later.

## 2023-06-05 ENCOUNTER — Telehealth: Payer: Self-pay | Admitting: *Deleted

## 2023-06-05 NOTE — Telephone Encounter (Signed)
 3rd attempt   Mailbox full

## 2023-06-09 NOTE — Telephone Encounter (Signed)
3rd and final attempt at reaching pt to schedule an appt. I will update requesting surgeons office and remove from pool.

## 2023-06-11 DIAGNOSIS — E1165 Type 2 diabetes mellitus with hyperglycemia: Secondary | ICD-10-CM | POA: Diagnosis not present

## 2023-06-11 DIAGNOSIS — E785 Hyperlipidemia, unspecified: Secondary | ICD-10-CM | POA: Diagnosis not present

## 2023-06-11 DIAGNOSIS — E559 Vitamin D deficiency, unspecified: Secondary | ICD-10-CM | POA: Diagnosis not present

## 2023-06-11 DIAGNOSIS — I10 Essential (primary) hypertension: Secondary | ICD-10-CM | POA: Diagnosis not present

## 2023-06-16 ENCOUNTER — Other Ambulatory Visit: Payer: Self-pay

## 2023-06-16 ENCOUNTER — Other Ambulatory Visit (HOSPITAL_COMMUNITY): Payer: Self-pay

## 2023-06-17 DIAGNOSIS — Z01818 Encounter for other preprocedural examination: Secondary | ICD-10-CM | POA: Diagnosis not present

## 2023-06-17 DIAGNOSIS — Z86010 Personal history of colonic polyps: Secondary | ICD-10-CM | POA: Diagnosis not present

## 2023-06-18 ENCOUNTER — Other Ambulatory Visit (HOSPITAL_COMMUNITY): Payer: Self-pay

## 2023-06-19 ENCOUNTER — Other Ambulatory Visit (HOSPITAL_COMMUNITY): Payer: Self-pay

## 2023-06-19 MED ORDER — BISACODYL 5 MG PO TBEC
DELAYED_RELEASE_TABLET | ORAL | 0 refills | Status: DC
  Filled 2023-06-19: qty 4, 1d supply, fill #0

## 2023-06-19 MED ORDER — PEG 3350-KCL-NA BICARB-NACL 420 G PO SOLR
ORAL | 0 refills | Status: DC
  Filled 2023-06-19: qty 4000, 1d supply, fill #0

## 2023-06-22 ENCOUNTER — Other Ambulatory Visit: Payer: Self-pay | Admitting: *Deleted

## 2023-06-22 DIAGNOSIS — Z125 Encounter for screening for malignant neoplasm of prostate: Secondary | ICD-10-CM

## 2023-06-23 NOTE — Progress Notes (Signed)
Patient: Philip Black           Date of Birth: 1956-01-15           MRN: [id number] Visit Date: 06/22/2023 PCP: Farris Has, MD  Prostate Cancer Screening Date of last physical exam: 06/11/23 Date of last rectal exam: 06/16/23 Have you ever had any of the following?: None Have you ever had or been told you have an allergy to latex products?: No Are you currently taking any natural prostate preparations?: No Are you currently experiencing any urinary symptoms?: No  Prostate Exam Exam not completed. PSA blood test completed only.  Patient's History Patient Active Problem List   Diagnosis Date Noted   Unstable angina (HCC) 05/07/2016   OSA (obstructive sleep apnea) 10/24/2013   Type II diabetes mellitus, uncontrolled 05/14/2011   Obesity (BMI 30-39.9) 05/14/2011   Mixed hyperlipidemia due to type 2 diabetes mellitus (HCC) 06/26/2010   Hypertension associated with diabetes (HCC) 06/26/2010   Coronary artery disease involving native coronary artery of native heart with unstable angina pectoris (HCC) 06/26/2010   Past Medical History:  Diagnosis Date   Arthritis    "shoulders, right hip" (05/07/2016)   Back pain    CAD S/P percutaneous coronary angioplasty    a. DES to LAD in 2009 b. cath: 05/2016 w/ patent stent to LAD, 99% stenosis Prox Cx (treated w/ DES), and 99% stenosis of small caliber non-dominant RCA (no intervention performed).   Carpal tunnel syndrome    Coronary artery disease involving native coronary artery of native heart with unstable angina pectoris (HCC) 06/26/2010   Qualifier: Diagnosis of  By: Juanda Chance, MD, Johny Chess    High cholesterol    Hip pain    History of heart attack    Hypertension    Hypertension associated with diabetes (HCC) 06/26/2010   Qualifier: Diagnosis of  By: Juanda Chance, MD, Johny Chess    Metabolic syndrome    OSA (obstructive sleep apnea)    "used mask; lost weight; stopped using mask" (05/07/2016)   Tinnitus    Type II diabetes  mellitus (HCC)    Unstable angina (HCC) 05/07/2016    Family History  Problem Relation Age of Onset   Diabetes Mother    Heart attack Father    Hyperlipidemia Father    Hypertension Father    Heart disease Father     Social History   Occupational History   Occupation: Retired  Tobacco Use   Smoking status: Never   Smokeless tobacco: Never  Substance and Sexual Activity   Alcohol use: No   Drug use: No   Sexual activity: Yes

## 2023-06-24 ENCOUNTER — Other Ambulatory Visit (HOSPITAL_COMMUNITY): Payer: Self-pay

## 2023-07-02 ENCOUNTER — Other Ambulatory Visit (HOSPITAL_COMMUNITY): Payer: Self-pay

## 2023-07-02 MED ORDER — METOPROLOL TARTRATE 50 MG PO TABS
50.0000 mg | ORAL_TABLET | Freq: Two times a day (BID) | ORAL | 0 refills | Status: DC
  Filled 2023-07-02: qty 180, 90d supply, fill #0

## 2023-07-06 ENCOUNTER — Other Ambulatory Visit: Payer: Self-pay | Admitting: Cardiovascular Disease

## 2023-07-07 ENCOUNTER — Other Ambulatory Visit (HOSPITAL_COMMUNITY): Payer: Self-pay

## 2023-07-08 ENCOUNTER — Other Ambulatory Visit (HOSPITAL_COMMUNITY): Payer: Self-pay

## 2023-07-08 MED ORDER — EZETIMIBE 10 MG PO TABS
10.0000 mg | ORAL_TABLET | Freq: Every day | ORAL | 1 refills | Status: DC
  Filled 2023-07-08 – 2023-07-21 (×2): qty 30, 30d supply, fill #0
  Filled 2023-08-13: qty 30, 30d supply, fill #1

## 2023-07-20 ENCOUNTER — Other Ambulatory Visit (HOSPITAL_COMMUNITY): Payer: Self-pay

## 2023-07-21 ENCOUNTER — Other Ambulatory Visit (HOSPITAL_COMMUNITY): Payer: Self-pay

## 2023-07-22 NOTE — Progress Notes (Unsigned)
Office Visit    Patient Name: Philip Black Date of Encounter: 07/23/2023  Primary Care Provider:  Farris Has, MD Primary Cardiologist:  Verne Carrow, MD  Chief Complaint    66 year old male with a history of CAD s/p DES-LAD in 2009, DES-LCx in 2017, hypertension, hyperlipidemia, type 2 diabetes, and OSA who presents for follow-up related to CAD.  Past Medical History    Past Medical History:  Diagnosis Date   Arthritis    "shoulders, right hip" (05/07/2016)   Back pain    CAD S/P percutaneous coronary angioplasty    a. DES to LAD in 2009 b. cath: 05/2016 w/ patent stent to LAD, 99% stenosis Prox Cx (treated w/ DES), and 99% stenosis of small caliber non-dominant RCA (no intervention performed).   Carpal tunnel syndrome    Coronary artery disease involving native coronary artery of native heart with unstable angina pectoris (HCC) 06/26/2010   Qualifier: Diagnosis of  By: Juanda Chance, MD, Johny Chess    High cholesterol    Hip pain    History of heart attack    Hypertension    Hypertension associated with diabetes (HCC) 06/26/2010   Qualifier: Diagnosis of  By: Juanda Chance, MD, Johny Chess    Metabolic syndrome    OSA (obstructive sleep apnea)    "used mask; lost weight; stopped using mask" (05/07/2016)   Tinnitus    Type II diabetes mellitus (HCC)    Unstable angina (HCC) 05/07/2016   Past Surgical History:  Procedure Laterality Date   CARDIAC CATHETERIZATION N/A 05/07/2016   Procedure: Left Heart Cath and Coronary Angiography;  Surgeon: Kathleene Hazel, MD;  Location: [facility] INVASIVE CV LAB;  Service: Cardiovascular;  Laterality: N/A;   CARDIAC CATHETERIZATION N/A 05/07/2016   Procedure: Coronary Stent Intervention;  Surgeon: Kathleene Hazel, MD;  Location: [facility] INVASIVE CV LAB;  Service: Cardiovascular;  Laterality: N/A;   CORONARY ANGIOPLASTY WITH STENT PLACEMENT  02/2008; 05/07/2016   TONSILLECTOMY  ~ 1969   WRIST SURGERY      Allergies  Allergies   Allergen Reactions   Canagliflozin-Metformin Hcl Other (See Comments)     Labs/Other Studies Reviewed    The following studies were reviewed today:  Cardiac Studies & Procedures   CARDIAC CATHETERIZATION  CARDIAC CATHETERIZATION 05/07/2016  Narrative  Mid RCA lesion, 99 %stenosed.  Prox RCA lesion, 90 %stenosed.  Ost 2nd Mrg to 2nd Mrg lesion, 80 %stenosed.  4th Mrg lesion, 50 %stenosed.  Ost 1st Mrg lesion, 50 %stenosed.  1st Mrg lesion, 50 %stenosed.  Ost LAD to Prox LAD lesion, 10 %stenosed.  A STENT PROMUS PREM MR 3.0X12 drug eluting stent was successfully placed.  Ost Cx to Prox Cx lesion, 99 %stenosed.  Post intervention, there is a 0% residual stenosis.  Prox LAD lesion, 30 %stenosed.  The left ventricular systolic function is normal.  LV end diastolic pressure is normal.  The left ventricular ejection fraction is 50-55% by visual estimate.  There is no mitral valve regurgitation.  1. Triple vessel CAD 2. Patent stent proximal LAD with minimal restenosis 3. Severe stenosis ostial/proximal Circumflex 4. Successful PTCA/DES x 1 proximal Circumflex 5. Severe stenosis small caliber non-dominant RCA 6. Low normal LV systolic function  Recommendations: Will start Brilinta and ASA and Brilinta for now. He will be enrolled in the TWILIGHT study. Continue statin.  Findings Coronary Findings Diagnostic  Dominance: Left  Left Anterior Descending The lesion was previously treated using a drug eluting stent over 2 years ago. The  lesion is discrete.  Lateral First Diagonal Branch Vessel is small in size.  Second Diagonal Branch Vessel is small in size.  Left Circumflex The lesion is discrete.  First Obtuse Marginal Branch Vessel is moderate in size. The lesion is discrete.  Second Obtuse Marginal Branch Vessel is small in size.  Fourth Obtuse Marginal Branch Vessel is moderate in size.  Left Posterior Descending Artery Vessel is moderate in  size.  Right Coronary Artery Vessel is small. The lesion is discrete. The lesion is discrete.  Intervention  Ost Cx to Prox Cx lesion Angioplasty Lesion crossed with guidewire. Pre-stent angioplasty was performed using a BALLOON EMERGE MR 2.5X12. A STENT PROMUS PREM MR 3.0X12 drug eluting stent was successfully placed. Stent strut is well apposed. Post-stent angioplasty was performed using a BALLOON [place] EMERGE MR 3.25X8. The pre-interventional distal flow is normal (TIMI 3).  The post-interventional distal flow is normal (TIMI 3). The intervention was successful . No complications occurred at this lesion. There is a 0% residual stenosis post intervention.     ECHOCARDIOGRAM  ECHOCARDIOGRAM COMPLETE 10/17/2021  Narrative ECHOCARDIOGRAM REPORT    Patient Name:   Philip Black Date of Exam: 10/17/2021 Medical Rec #:  [id number]     Height:       70.0 in Accession #:    [id number]    Weight:       251.4 lb Date of Birth:  Jan 11, 1956    BSA:          2.300 m Patient Age:    65 years      BP:           148/88 mmHg Patient Gender: M             HR:           75 bpm. Exam Location:  [address]  Procedure: 2D Echo, 3D Echo, Cardiac Doppler, Color Doppler and Strain Analysis  Indications:    I25.1 CAD  History:        Patient has no prior history of Echocardiogram examinations. CAD; Risk Factors:Hypertension, Dyslipidemia, Obesity and Sleep Apnea.  Sonographer:    Jorje Guild BS, RDCS Referring Phys: [id number] CHRISTOPHER D MCALHANY  IMPRESSIONS   1. Left ventricular ejection fraction, by estimation, is 60 to 65%. The left ventricle has normal function. The left ventricle has no regional wall motion abnormalities. There is moderate left ventricular hypertrophy. Left ventricular diastolic parameters are consistent with Grade I diastolic dysfunction (impaired relaxation). The average left ventricular global longitudinal strain is -17.0 %. The global longitudinal strain is normal. 2.  Right ventricular systolic function is normal. The right ventricular size is normal. 3. The mitral valve is grossly normal. Trivial mitral valve regurgitation. 4. The aortic valve is normal in structure. Aortic valve regurgitation is not visualized.  FINDINGS Left Ventricle: Left ventricular ejection fraction, by estimation, is 60 to 65%. The left ventricle has normal function. The left ventricle has no regional wall motion abnormalities. The average left ventricular global longitudinal strain is -17.0 %. The global longitudinal strain is normal. 3D left ventricular ejection fraction analysis performed but not reported based on interpreter judgement due to suboptimal tracking. The left ventricular internal cavity size was normal in size. There is moderate left ventricular hypertrophy. Left ventricular diastolic parameters are consistent with Grade I diastolic dysfunction (impaired relaxation).  Right Ventricle: The right ventricular size is normal. Right vetricular wall thickness was not well visualized. Right ventricular systolic function is normal.  Left Atrium:  Left atrial size was normal in size.  Right Atrium: Right atrial size was normal in size.  Pericardium: There is no evidence of pericardial effusion.  Mitral Valve: The mitral valve is grossly normal. Trivial mitral valve regurgitation.  Tricuspid Valve: The tricuspid valve is grossly normal. Tricuspid valve regurgitation is trivial.  Aortic Valve: The aortic valve is normal in structure. Aortic valve regurgitation is not visualized.  Pulmonic Valve: The pulmonic valve was normal in structure. Pulmonic valve regurgitation is not visualized.  Aorta: The aortic root and ascending aorta are structurally normal, with no evidence of dilitation.  IAS/Shunts: The atrial septum is grossly normal.   LEFT VENTRICLE PLAX 2D LVIDd:         4.90 cm   Diastology LVIDs:         3.60 cm   LV e' medial:    4.79 cm/s LV PW:         1.40 cm    LV E/e' medial:  15.7 LV IVS:        1.40 cm   LV e' lateral:   6.31 cm/s LVOT diam:     2.40 cm   LV E/e' lateral: 11.9 LV SV:         57 LV SV Index:   25        2D Longitudinal Strain LVOT Area:     4.52 cm  2D Strain GLS (A2C):   -14.7 % 2D Strain GLS (A3C):   -19.5 % 2D Strain GLS (A4C):   -16.7 % 2D Strain GLS Avg:     -17.0 %  3D Volume EF: 3D EF:        50 % LV EDV:       170 ml LV ESV:       85 ml LV SV:        85 ml  RIGHT VENTRICLE             IVC RV Basal diam:  2.80 cm     IVC diam: 1.30 cm RV S prime:     11.50 cm/s TAPSE (M-mode): 2.2 cm  LEFT ATRIUM           Index        RIGHT ATRIUM           Index LA diam:      4.70 cm 2.04 cm/m   RA Pressure: 3.00 mmHg LA Vol (A2C): 33.6 ml 14.61 ml/m  RA Area:     13.60 cm LA Vol (A4C): 19.7 ml 8.56 ml/m   RA Volume:   29.80 ml  12.95 ml/m AORTIC VALVE LVOT Vmax:   67.25 cm/s LVOT Vmean:  45.200 cm/s LVOT VTI:    0.126 m  AORTA Ao Root diam: 3.70 cm Ao Asc diam:  3.50 cm  MITRAL VALVE                TRICUSPID VALVE Estimated RAP:  3.00 mmHg MV Decel Time: 330 msec MV E velocity: 75.20 cm/s   SHUNTS MV A velocity: 121.00 cm/s  Systemic VTI:  0.13 m MV E/A ratio:  0.62         Systemic Diam: 2.40 cm  Kristeen Miss MD Electronically signed by Kristeen Miss MD Signature Date/Time: 10/17/2021/2:58:44 PM    Final            Recent Labs: No results found for requested labs within last 365 days.  Recent Lipid Panel    Component Value Date/Time  CHOL 152 08/03/2018 0756   TRIG 83 08/03/2018 0756   HDL 66 08/03/2018 0756   CHOLHDL 2.3 08/03/2018 0756   CHOLHDL 2.2 10/24/2013 1152   VLDL 12 10/24/2013 1152   LDLCALC 69 08/03/2018 0756   LDLDIRECT 64 08/08/2009 0000    History of Present Illness    67 year old male with the above past medical history including CAD s/p DES-LAD in 2009, DES-LCx in 2017, hypertension, hyperlipidemia, type 2 diabetes, and OSA.  He was hospitalized in May 2009 in the  setting of anterior MI, treated with a drug-eluting stent to the LAD. Cardiac catheterization in August 2017 in the setting of unstable angina revealed severe stenosis of the proximal circumflex treated with a drug-eluting stent as well as severe stenosis in a small caliber nondominant RCA, managed medically.  LV function was normal by LV gram.  He has been intolerant to high-dose beta-blocker in the setting of bradycardia. He was last seen in the office on 09/12/2022 and was stable from a cardiac standpoint.  He noted 1 episode of chest discomfort in the setting of upper respiratory infection, he denied exertional symptoms concerning for angina.  He presents today for follow-up. Since his last visit he has been stable overall from a cardiac standpoint. He has noted occasional chest discomfort after sexual intercourse, he denies any exertional symptoms, denies dyspnea. He notes that he will likely have a hip replacement in December of this year.  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  Overall, he reports feeling well.    Home Medications    Current Outpatient Medications  Medication Sig Dispense Refill   amLODipine (NORVASC) 5 MG tablet Take 1 tablet (5 mg total) by mouth daily. 90 tablet 3   aspirin EC 81 MG tablet Take 1 tablet (81 mg total) daily by mouth. 90 tablet 3   bisacodyl (DULCOLAX) 5 MG EC tablet Take as directed. 4 tablet 0   colesevelam (WELCHOL) 625 MG tablet Take 2 tablets (1,250 mg total) by mouth daily with a meal 180 tablet 2   Continuous Blood Gluc Sensor (FREESTYLE LIBRE 14 DAY SENSOR) MISC Change sensor every 14 days 2 each 11   Continuous Glucose Receiver (FREESTYLE LIBRE 3 READER) DEVI use as directed 1 each 0   Continuous Glucose Receiver (FREESTYLE LIBRE 3 READER) DEVI Use to check blood glucose 1 each 0   Continuous Glucose Sensor (FREESTYLE LIBRE 3 SENSOR) MISC change sensor every 14 days 6 each 4   COVID-19 mRNA Vac-TriS, Pfizer,  (PFIZER-BIONT COVID-19 VAC-TRIS) SUSP injection Inject into the muscle. 0.3 mL 0   Dulaglutide (TRULICITY) 4.5 MG/0.5ML SOPN Inject 4.5 mg into the skin once a week. 6 mL 3   empagliflozin (JARDIANCE) 25 MG TABS tablet Take 1 tablet (25 mg total) by mouth daily. 90 tablet 3   ergocalciferol (VITAMIN D2) 1.25 MG (50000 UT) capsule Take 1 capsule by mouth  once a week until 07/22/2022 8 capsule 0   ezetimibe (ZETIA) 10 MG tablet Take 1 tablet (10 mg total) by mouth daily. Appointment needed for future refills. Thank you 30 tablet 1   insulin aspart protamine - aspart (NOVOLOG MIX 70/30 FLEXPEN) (70-30) 100 UNIT/ML FlexPen Inject 80 units under the skin at breakfast, 20 units at lunch, 90 units at evening meal 180 mL 3   Insulin Pen Needle (TECHLITE PEN NEEDLES) 32G X 4 MM MISC Use as directed 2 times a day 200 each 5   lisinopril (ZESTRIL) 40 MG tablet  Take 1 tablet (40 mg total) by mouth daily. 90 tablet 3   methocarbamol (ROBAXIN) 500 MG tablet Take 1 tablet by mouth 4 times a day. 60 tablet 0   Multiple Vitamins-Minerals (MENS MULTIPLUS PO) Take by mouth.     nitroGLYCERIN (NITROSTAT) 0.4 MG SL tablet Place 1 tablet (0.4 mg total) under the tongue every 5 (five) minutes as needed for chest pain (X 3 DOSES MAX). 25 tablet 6   polyethylene glycol-electrolytes (NULYTELY) 420 g solution Take as directed per package instructions. 4000 mL 0   sildenafil (VIAGRA) 100 MG tablet Take 0.5-1 tablets (50-100 mg total) by mouth 30-60 minutes prior to intercourse as directed. 8 tablet 5   TRULICITY 0.75 MG/0.5ML SOPN Inject 0.75 mLs into the skin once a week.     Vitamin D, Ergocalciferol, (DRISDOL) 1.25 MG (50000 UT) CAPS capsule Take 1 capsule (50,000 Units total) by mouth every 7 (seven) days. 4 capsule 0   metoprolol tartrate (LOPRESSOR) 25 MG tablet TAKE 1 TABLET BY MOUTH 2 TIMES DAILY. 180 tablet 2   rosuvastatin (CRESTOR) 40 MG tablet Take 1 tablet (40 mg total) by mouth daily. (Patient not taking:  Reported on 07/23/2023) 90 tablet 2   No current facility-administered medications for this visit.     Review of Systems    He denies palpitations, dyspnea, pnd, orthopnea, n, v, dizziness, syncope, edema, weight gain, or early satiety. All other systems reviewed and are otherwise negative except as noted above.   Physical Exam    VS:  BP 138/76   Ht 5\' 10"  (1.778 m)   Wt 253 lb (114.8 kg)   SpO2 95%   BMI 36.30 kg/m  GEN: Well nourished, well developed, in no acute distress. HEENT: normal. Neck: Supple, no JVD, carotid bruits, or masses. Cardiac: RRR, no murmurs, rubs, or gallops. No clubbing, cyanosis, edema.  Radials/DP/PT 2+ and equal bilaterally.  Respiratory:  Respirations regular and unlabored, clear to auscultation bilaterally. GI: Soft, nontender, nondistended, BS + x 4. MS: no deformity or atrophy. Skin: warm and dry, no rash. Neuro:  Strength and sensation are intact. Psych: Normal affect.  Accessory Clinical Findings    ECG personally reviewed by me today - EKG Interpretation Date/Time:  Thursday July 23 2023 09:22:23 EDT Ventricular Rate:  84 PR Interval:  220 QRS Duration:  90 QT Interval:  394 QTC Calculation: 465 R Axis:   37  Text Interpretation: Sinus rhythm with 1st degree A-V block Possible Left atrial enlargement Minimal voltage criteria for LVH, may be normal variant ( Sokolow-Lyon ) Confirmed by Bernadene Person ([id number]) on 07/23/2023 9:24:58 AM  - no acute changes.   Lab Results  Component Value Date   WBC 11.2 (H) 05/22/2020   HGB 16.0 05/22/2020   HCT 49.4 05/22/2020   MCV 96 05/22/2020   PLT 226 05/22/2020   Lab Results  Component Value Date   CREATININE 1.34 (H) 06/26/2020   BUN 24 06/26/2020   NA 139 06/26/2020   K 4.3 06/26/2020   CL 100 06/26/2020   CO2 23 06/26/2020   Lab Results  Component Value Date   ALT 24 12/13/2018   AST 35 12/13/2018   ALKPHOS 65 12/13/2018   BILITOT 0.5 12/13/2018   Lab Results  Component Value  Date   CHOL 152 08/03/2018   HDL 66 08/03/2018   LDLCALC 69 08/03/2018   LDLDIRECT 64 08/08/2009   TRIG 83 08/03/2018   CHOLHDL 2.3 08/03/2018    Lab Results  Component  Value Date   HGBA1C 8.2 (H) 12/13/2018    Assessment & Plan    1. CAD: S/p DES-LAD in 2009, DES-LCx in 2017. He has noted occasional chest discomfort after sexual intercourse, denies any other exertional symptoms. We discussed that should his symptoms progress, could consider further ischemic evaluation (i.e. cardiac PET stress test).  For now, continue to monitor symptoms.  Continue aspirin, amlodipine, metoprolol, lisinopril, WelChol, and Zetia.  2. Hypertension: BP well controlled. Continue current antihypertensive regimen.   3. Hyperlipidemia: LDL was 58 in 12/2022.  Continue WelChol, Zetia.  4. Type 2 diabetes: A1c was 8.2 in 05/2023.  Monitored and managed per PCP.  5. OSA: No longer on CPAP.  6. Preoperative cardiac exam: He notes that he will likely have hip surgery in December 2024.  According to the Revised Cardiac Risk Index (RCRI), his Perioperative Risk of Major Cardiac Event is (%): 6.6. He is able to complete greater than 4 METS. Therefore, based on ACC/AHA guidelines, patient would be at acceptable risk for the planned procedure without further cardiovascular testing.   7. Disposition: Follow-up in 4 months, sooner if needed.      Joylene Grapes, NP 07/23/2023, 10:13 AM

## 2023-07-23 ENCOUNTER — Encounter: Payer: Self-pay | Admitting: Nurse Practitioner

## 2023-07-23 ENCOUNTER — Ambulatory Visit: Payer: Federal, State, Local not specified - PPO | Attending: Nurse Practitioner | Admitting: Nurse Practitioner

## 2023-07-23 VITALS — BP 138/76 | Ht 70.0 in | Wt 253.0 lb

## 2023-07-23 DIAGNOSIS — E785 Hyperlipidemia, unspecified: Secondary | ICD-10-CM

## 2023-07-23 DIAGNOSIS — I10 Essential (primary) hypertension: Secondary | ICD-10-CM

## 2023-07-23 DIAGNOSIS — Z794 Long term (current) use of insulin: Secondary | ICD-10-CM

## 2023-07-23 DIAGNOSIS — I2510 Atherosclerotic heart disease of native coronary artery without angina pectoris: Secondary | ICD-10-CM

## 2023-07-23 DIAGNOSIS — E119 Type 2 diabetes mellitus without complications: Secondary | ICD-10-CM | POA: Diagnosis not present

## 2023-07-23 DIAGNOSIS — G4733 Obstructive sleep apnea (adult) (pediatric): Secondary | ICD-10-CM

## 2023-07-23 NOTE — Patient Instructions (Signed)
Medication Instructions:  Your physician recommends that you continue on your current medications as directed. Please refer to the Current Medication list given to you today.  *If you need a refill on your cardiac medications before your next appointment, please call your pharmacy*   Lab Work: NONE ordered at this time of appointment    Testing/Procedures: NONE ordered at this time of appointment     Follow-Up: At [facility], you and your health needs are our priority.  As part of our continuing mission to provide you with exceptional heart care, we have created designated Provider Care Teams.  These Care Teams include your primary Cardiologist (physician) and Advanced Practice Providers (APPs -  Physician Assistants and Nurse Practitioners) who all work together to provide you with the care you need, when you need it.  We recommend signing up for the patient portal called "MyChart".  Sign up information is provided on this After Visit Summary.  MyChart is used to connect with patients for Virtual Visits (Telemedicine).  Patients are able to view lab/test results, encounter notes, upcoming appointments, etc.  Non-urgent messages can be sent to your provider as well.   To learn more about what you can do with MyChart, go to [email or web address].    Your next appointment:   4 month(s)  Provider:   Bernadene Person, NP

## 2023-07-27 ENCOUNTER — Other Ambulatory Visit (HOSPITAL_COMMUNITY): Payer: Self-pay

## 2023-07-27 MED ORDER — COLESEVELAM HCL 625 MG PO TABS
1250.0000 mg | ORAL_TABLET | Freq: Every day | ORAL | 2 refills | Status: DC
  Filled 2023-07-27: qty 180, 90d supply, fill #0
  Filled 2023-10-29: qty 180, 90d supply, fill #1
  Filled 2024-02-05: qty 180, 90d supply, fill #2

## 2023-07-29 DIAGNOSIS — K648 Other hemorrhoids: Secondary | ICD-10-CM | POA: Diagnosis not present

## 2023-07-29 DIAGNOSIS — Z09 Encounter for follow-up examination after completed treatment for conditions other than malignant neoplasm: Secondary | ICD-10-CM | POA: Diagnosis not present

## 2023-07-29 DIAGNOSIS — D122 Benign neoplasm of ascending colon: Secondary | ICD-10-CM | POA: Diagnosis not present

## 2023-07-29 DIAGNOSIS — D123 Benign neoplasm of transverse colon: Secondary | ICD-10-CM | POA: Diagnosis not present

## 2023-07-29 DIAGNOSIS — Z860101 Personal history of adenomatous and serrated colon polyps: Secondary | ICD-10-CM | POA: Diagnosis not present

## 2023-08-10 DIAGNOSIS — E119 Type 2 diabetes mellitus without complications: Secondary | ICD-10-CM | POA: Diagnosis not present

## 2023-08-10 DIAGNOSIS — Z01818 Encounter for other preprocedural examination: Secondary | ICD-10-CM | POA: Diagnosis not present

## 2023-08-10 DIAGNOSIS — I10 Essential (primary) hypertension: Secondary | ICD-10-CM | POA: Diagnosis not present

## 2023-08-10 DIAGNOSIS — M25559 Pain in unspecified hip: Secondary | ICD-10-CM | POA: Diagnosis not present

## 2023-08-12 ENCOUNTER — Other Ambulatory Visit (HOSPITAL_COMMUNITY): Payer: Self-pay

## 2023-08-12 DIAGNOSIS — I2510 Atherosclerotic heart disease of native coronary artery without angina pectoris: Secondary | ICD-10-CM | POA: Diagnosis not present

## 2023-08-12 DIAGNOSIS — E1165 Type 2 diabetes mellitus with hyperglycemia: Secondary | ICD-10-CM | POA: Diagnosis not present

## 2023-08-12 DIAGNOSIS — Z794 Long term (current) use of insulin: Secondary | ICD-10-CM | POA: Diagnosis not present

## 2023-08-12 MED ORDER — OZEMPIC (0.25 OR 0.5 MG/DOSE) 2 MG/3ML ~~LOC~~ SOPN
0.2500 mg | PEN_INJECTOR | SUBCUTANEOUS | 4 refills | Status: AC
  Filled 2023-08-12: qty 9, 84d supply, fill #0

## 2023-08-12 NOTE — H&P (Signed)
TOTAL HIP ADMISSION H&P  Patient is admitted for right total hip arthroplasty.  Subjective:  Chief Complaint: Right hip pain  HPI: Philip Black, 67 y.o. male, has a history of pain and functional disability in the right hip due to arthritis and patient has failed non-surgical conservative treatments for greater than 12 weeks to include use of assistive devices, weight reduction as appropriate, and activity modification. Onset of symptoms was gradual, starting  several  years ago with gradually worsening course since that time. The patient noted no past surgery on the right hip. Patient currently rates pain in the right hip at 9 out of 10 with activity. Patient has night pain, worsening of pain with activity and weight bearing, pain that interfers with activities of daily living, and pain with passive range of motion. Patient has evidence of  severe end-stage arthritis in the right hip. The images show a bone-on-bone condition with large osteophyte formation  by imaging studies. This condition presents safety issues increasing the risk of falls. There is no current active infection.  Patient Active Problem List   Diagnosis Date Noted   Unstable angina (HCC) 05/07/2016   OSA (obstructive sleep apnea) 10/24/2013   Type II diabetes mellitus, uncontrolled 05/14/2011   Obesity (BMI 30-39.9) 05/14/2011   Mixed hyperlipidemia due to type 2 diabetes mellitus (HCC) 06/26/2010   Hypertension associated with diabetes (HCC) 06/26/2010   Coronary artery disease involving native coronary artery of native heart with unstable angina pectoris (HCC) 06/26/2010    Past Medical History:  Diagnosis Date   Arthritis    "shoulders, right hip" (05/07/2016)   Back pain    CAD S/P percutaneous coronary angioplasty    a. DES to LAD in 2009 b. cath: 05/2016 w/ patent stent to LAD, 99% stenosis Prox Cx (treated w/ DES), and 99% stenosis of small caliber non-dominant RCA (no intervention performed).   Carpal tunnel  syndrome    Coronary artery disease involving native coronary artery of native heart with unstable angina pectoris (HCC) 06/26/2010   Qualifier: Diagnosis of  By: Juanda Chance, MD, Johny Chess    High cholesterol    Hip pain    History of heart attack    Hypertension    Hypertension associated with diabetes (HCC) 06/26/2010   Qualifier: Diagnosis of  By: Juanda Chance, MD, Johny Chess    Metabolic syndrome    OSA (obstructive sleep apnea)    "used mask; lost weight; stopped using mask" (05/07/2016)   Tinnitus    Type II diabetes mellitus (HCC)    Unstable angina (HCC) 05/07/2016    Past Surgical History:  Procedure Laterality Date   CARDIAC CATHETERIZATION N/A 05/07/2016   Procedure: Left Heart Cath and Coronary Angiography;  Surgeon: Kathleene Hazel, MD;  [facility] INVASIVE CV LAB;  Service: Cardiovascular;  Laterality: N/A;   CARDIAC CATHETERIZATION N/A 05/07/2016   Procedure: Coronary Stent Intervention;  Surgeon: Kathleene Hazel, MD;  Location: [facility] INVASIVE CV LAB;  Service: Cardiovascular;  Laterality: N/A;   CORONARY ANGIOPLASTY WITH STENT PLACEMENT  02/2008; 05/07/2016   TONSILLECTOMY  ~ 1969   WRIST SURGERY      Prior to Admission medications   Medication Sig Start Date End Date Taking? Authorizing Provider  amLODipine (NORVASC) 5 MG tablet Take 1 tablet (5 mg total) by mouth daily. 09/12/22   Sharlene Dory, PA-C  aspirin EC 81 MG tablet Take 1 tablet (81 mg total) daily by mouth. 08/19/17   Kathleene Hazel, MD  bisacodyl (DULCOLAX) 5 MG EC tablet Take as directed. 06/17/23     colesevelam (WELCHOL) 625 MG tablet Take 2 tablets (1,250 mg total) by mouth daily with a meal 07/27/23     Continuous Blood Gluc Sensor (FREESTYLE LIBRE 14 DAY SENSOR) MISC Change sensor every 14 days 12/12/22     Continuous Glucose Receiver (FREESTYLE LIBRE 3 READER) DEVI use as directed 02/20/23   Talmage Coin, MD  Continuous Glucose Receiver (FREESTYLE LIBRE 3 READER) DEVI Use to check  blood glucose 05/01/23     Continuous Glucose Sensor (FREESTYLE LIBRE 3 SENSOR) MISC change sensor every 14 days 02/20/23   Talmage Coin, MD  COVID-19 mRNA Vac-TriS, Pfizer, (PFIZER-BIONT COVID-19 VAC-TRIS) SUSP injection Inject into the muscle. 05/03/21   Judyann Munson, MD  Dulaglutide (TRULICITY) 4.5 MG/0.5ML SOPN Inject 4.5 mg into the skin once a week. 04/30/23     empagliflozin (JARDIANCE) 25 MG TABS tablet Take 1 tablet (25 mg total) by mouth daily. 02/24/23     ergocalciferol (VITAMIN D2) 1.25 MG (50000 UT) capsule Take 1 capsule by mouth  once a week until 07/22/2022 05/22/22     ezetimibe (ZETIA) 10 MG tablet Take 1 tablet (10 mg total) by mouth daily. Appointment needed for future refills. Thank you 07/08/23   Kathleene Hazel, MD  insulin aspart protamine - aspart (NOVOLOG MIX 70/30 FLEXPEN) (70-30) 100 UNIT/ML FlexPen Inject 80 units under the skin at breakfast, 20 units at lunch, 90 units at evening meal 01/06/23     Insulin Pen Needle (TECHLITE PEN NEEDLES) 32G X 4 MM MISC Use as directed 2 times a day 01/09/23     lisinopril (ZESTRIL) 40 MG tablet Take 1 tablet (40 mg total) by mouth daily. 09/12/22   Sharlene Dory, PA-C  methocarbamol (ROBAXIN) 500 MG tablet Take 1 tablet by mouth 4 times a day. 05/15/21     metoprolol tartrate (LOPRESSOR) 25 MG tablet TAKE 1 TABLET BY MOUTH 2 TIMES DAILY. 09/03/20 11/03/21  Kathleene Hazel, MD  Multiple Vitamins-Minerals (MENS MULTIPLUS PO) Take by mouth.    [provider]  nitroGLYCERIN (NITROSTAT) 0.4 MG SL tablet Place 1 tablet (0.4 mg total) under the tongue every 5 (five) minutes as needed for chest pain (X 3 DOSES MAX). 07/27/19   Kathleene Hazel, MD  polyethylene glycol-electrolytes (NULYTELY) 420 g solution Take as directed per package instructions. 06/17/23     rosuvastatin (CRESTOR) 40 MG tablet Take 1 tablet (40 mg total) by mouth daily. Patient not taking: Reported on 07/23/2023 11/10/22   Kathleene Hazel, MD   sildenafil (VIAGRA) 100 MG tablet Take 0.5-1 tablets (50-100 mg total) by mouth 30-60 minutes prior to intercourse as directed. 06/20/22     TRULICITY 0.75 MG/0.5ML SOPN Inject 0.75 mLs into the skin once a week. 07/22/19   [provider]  Vitamin D, Ergocalciferol, (DRISDOL) 1.25 MG (50000 UT) CAPS capsule Take 1 capsule (50,000 Units total) by mouth every 7 (seven) days. 12/27/18   Langston Reusing, MD    Allergies  Allergen Reactions   Canagliflozin-Metformin Hcl Other (See Comments)    Social History   Socioeconomic History   Marital status: Married    Spouse name: Furqan Gosselin   Number of children: 3   Years of education: Not on file   Highest education level: Not on file  Occupational History   Occupation: Retired  Tobacco Use   Smoking status: Never   Smokeless tobacco: Never  Substance and Sexual Activity  Alcohol use: No   Drug use: No   Sexual activity: Yes  Other Topics Concern   Not on file  Social History Narrative   Not on file   Social Determinants of Health   Financial Resource Strain: Not on file  Food Insecurity: Not on file  Transportation Needs: Not on file  Physical Activity: Not on file  Stress: Not on file  Social Connections: Not on file  Intimate Partner Violence: Not on file    Tobacco Use: Low Risk  (07/23/2023)   Patient History    Smoking Tobacco Use: Never    Smokeless Tobacco Use: Never    Passive Exposure: Not on file   Social History   Substance and Sexual Activity  Alcohol Use No    Family History  Problem Relation Age of Onset   Diabetes Mother    Heart attack Father    Hyperlipidemia Father    Hypertension Father    Heart disease Father     Review of Systems  Constitutional:  Negative for chills and fever.  HENT:  Negative for congestion, sore throat and tinnitus.   Eyes:  Negative for double vision, photophobia and pain.  Respiratory:  Negative for cough, shortness of breath and wheezing.    Cardiovascular:  Negative for chest pain, palpitations and orthopnea.  Gastrointestinal:  Negative for heartburn, nausea and vomiting.  Genitourinary:  Negative for dysuria, frequency and urgency.  Musculoskeletal:  Positive for joint pain.  Neurological:  Negative for dizziness, weakness and headaches.     Objective:  Physical Exam:  Well nourished and well developed.  General: Alert and oriented x3, cooperative and pleasant, no acute distress.  Head: normocephalic, atraumatic, neck supple.  Eyes: EOMI.  Musculoskeletal:  Right hip shows flexion to 90 with no internal or external rotation, only about 10 degrees of abduction.  Calves soft and nontender. Motor function intact in LE. Strength 5/5 LE bilaterally. Neuro: Distal pulses 2+. Sensation to light touch intact in LE.   Imaging Review Plain radiographs demonstrate severe degenerative joint disease of the right hip. The bone quality appears to be adequate for age and reported activity level.  Assessment/Plan:  End stage arthritis, right hip  The patient history, physical examination, clinical judgement of the provider and imaging studies are consistent with end stage degenerative joint disease of the right hip and total hip arthroplasty is deemed medically necessary. The treatment options including medical management, injection therapy, arthroscopy and arthroplasty were discussed at length. The risks and benefits of total hip arthroplasty were presented and reviewed. The risks due to aseptic loosening, infection, stiffness, dislocation/subluxation, thromboembolic complications and other imponderables were discussed. The patient acknowledged the explanation, agreed to proceed with the plan and consent was signed. Patient is being admitted for inpatient treatment for surgery, pain control, PT, OT, prophylactic antibiotics, VTE prophylaxis, progressive ambulation and ADLs and discharge planning.The patient is planning to be  discharged  home .   Patient's anticipated LOS is less than 2 midnights, meeting these requirements: - Lives within 1 hour of care - Has a competent adult at home to recover with post-op recover - NO history of  - Chronic pain requiring opioids  - Heart failure  - Stroke  - DVT/VTE  - Cardiac arrhythmia  - Respiratory Failure/COPD  - Renal failure  - Anemia  - Advanced Liver disease  Therapy Plans: HEP Disposition: Home with wife Planned DVT Prophylaxis: Plavix + 325 mg ASA QD DME Needed: Dan Humphreys PCP: Farris Has, MD (appt  11/4) Cardiologist: Verne Carrow, MD (clearance received) Endocrinologist: Talmage Coin, MD  TXA: IV Allergies: Muscle relaxers Anesthesia Concerns: None BMI: 36.9 Last HgbA1c: 8.2% in 05/2023 Pain Regimen: Hydrocodone Pharmacy: [facility] (have brought to room)  Other: - Hx CAD, DM - Had A1c checked with Dr. Kateri Plummer yesterday, aware that if this is 7.8% or higher we will need to postpone surgery - ** does not want muscle relaxers, could not function for two days after taking with previous shoulder surgery **   - Patient was instructed on what medications to stop prior to surgery. - Follow-up visit in 2 weeks with Dr. Lequita Halt - Begin physical therapy following surgery - Pre-operative lab work as pre-surgical testing - Prescriptions will be provided in hospital at time of discharge  Arther Abbott, PA-C Orthopedic Surgery [facility] [place]

## 2023-08-13 ENCOUNTER — Other Ambulatory Visit: Payer: Self-pay | Admitting: Cardiovascular Disease

## 2023-08-13 ENCOUNTER — Other Ambulatory Visit (HOSPITAL_COMMUNITY): Payer: Self-pay

## 2023-08-14 ENCOUNTER — Other Ambulatory Visit (HOSPITAL_COMMUNITY): Payer: Self-pay

## 2023-08-14 MED ORDER — ROSUVASTATIN CALCIUM 40 MG PO TABS
40.0000 mg | ORAL_TABLET | Freq: Every day | ORAL | 3 refills | Status: AC
  Filled 2023-08-14: qty 90, 90d supply, fill #0
  Filled 2023-11-23: qty 90, 90d supply, fill #1
  Filled 2024-02-22: qty 90, 90d supply, fill #2
  Filled 2024-05-24: qty 90, 90d supply, fill #3

## 2023-08-17 ENCOUNTER — Other Ambulatory Visit (HOSPITAL_COMMUNITY): Payer: Self-pay

## 2023-08-20 ENCOUNTER — Other Ambulatory Visit (HOSPITAL_COMMUNITY): Payer: Self-pay

## 2023-08-20 DIAGNOSIS — E113393 Type 2 diabetes mellitus with moderate nonproliferative diabetic retinopathy without macular edema, bilateral: Secondary | ICD-10-CM | POA: Diagnosis not present

## 2023-08-25 ENCOUNTER — Other Ambulatory Visit (HOSPITAL_COMMUNITY): Payer: Self-pay

## 2023-08-25 NOTE — Patient Instructions (Addendum)
SURGICAL WAITING ROOM VISITATION Patients having surgery or a procedure may have no more than 2 support people in the waiting area - these visitors may rotate.    Children under the age of 47 must have an adult with them who is not the patient.  If the patient needs to stay at the hospital during part of their recovery, the visitor guidelines for inpatient rooms apply. Pre-op nurse will coordinate an appropriate time for 1 support person to accompany patient in pre-op.  This support person may not rotate.    Please refer to the [facility] website for the visitor guidelines for Inpatients (after your surgery is over and you are in a regular room).       Your procedure is scheduled on: 09-09-23   Report to [facility] Main Entrance    Report to admitting at 6:15 AM   Call this number if you have problems the morning of surgery [phone]   Do not eat food :After Midnight.   After Midnight you may have the following liquids until 5:30 AM DAY OF SURGERY  Water Non-Citrus Juices (without pulp, NO RED-Apple, White grape, White cranberry) Black Coffee (NO MILK/CREAM OR CREAMERS, sugar ok)  Clear Tea (NO MILK/CREAM OR CREAMERS, sugar ok) regular and decaf                             Plain Jell-O (NO RED)                                           Fruit ices (not with fruit pulp, NO RED)                                     Popsicles (NO RED)                                                               Sports drinks like Gatorade (NO RED)                   The day of surgery:  Drink ONE (1) Pre-Surgery G2 by 5:30  AM the morning of surgery. Drink in one sitting. Do not sip.  This drink was given to you during your hospital  pre-op appointment visit. Nothing else to drink after completing the Pre-Surgery G2.          If you have questions, please contact your surgeon's office.   FOLLOW  ANY ADDITIONAL PRE OP INSTRUCTIONS YOU RECEIVED FROM YOUR SURGEON'S OFFICE!!!      Oral Hygiene is also important to reduce your risk of infection.                                    Remember - BRUSH YOUR TEETH THE MORNING OF SURGERY WITH YOUR REGULAR TOOTHPASTE   Do NOT smoke after Midnight   Take these medicines the morning of surgery with A SIP OF WATER:   Amlodipine  Colesevelam  Ezetimibe  Metoprolol  Rosuvastatin  Stop all vitamins and herbal supplements 7 days before surgery  How to Manage Your Diabetes Before and After Surgery  Why is it important to control my blood sugar before and after surgery? Improving blood sugar levels before and after surgery helps healing and can limit problems. A way of improving blood sugar control is eating a healthy diet by:  Eating less sugar and carbohydrates  Increasing activity/exercise  Talking with your doctor about reaching your blood sugar goals High blood sugars (greater than 180 mg/dL) can raise your risk of infections and slow your recovery, so you will need to focus on controlling your diabetes during the weeks before surgery. Make sure that the doctor who takes care of your diabetes knows about your planned surgery including the date and location.  How do I manage my blood sugar before surgery? Check your blood sugar at least 4 times a day, starting 2 days before surgery, to make sure that the level is not too high or low. Check your blood sugar the morning of your surgery when you wake up and every 2 hours until you get to the Short Stay unit. If your blood sugar is less than 70 mg/dL, you will need to treat for low blood sugar: Do not take insulin. Treat a low blood sugar (less than 70 mg/dL) with  cup of clear juice (cranberry or apple), 4 glucose tablets, OR glucose gel. Recheck blood sugar in 15 minutes after treatment (to make sure it is greater than 70 mg/dL). If your blood sugar is not greater than 70 mg/dL on recheck, call [phone] for further instructions. Report your blood sugar to the short stay  nurse when you get to Short Stay.  If you are admitted to the hospital after surgery: Your blood sugar will be checked by the staff and you will probably be given insulin after surgery (instead of oral diabetes medicines) to make sure you have good blood sugar levels. The goal for blood sugar control after surgery is 80-180 mg/dL.   WHAT DO I DO ABOUT MY DIABETES MEDICATION?  Do not take oral diabetes medicines (pills) the morning of surgery.        Hold Trulicity/Semaglutide 7 days before surgery (do not take after 09-01-23)        Hold Jardiance 3 days before surgery (do not take after 09-05-23)  THE NIGHT BEFORE SURGERY:  Take 63 units of Novolog 70/30.Marland Kitchen     THE MORNING OF SURGERY:  Do not take Novolog insulin. .  DO NOT TAKE THE FOLLOWING 7 DAYS PRIOR TO SURGERY: Ozempic, Wegovy, Rybelsus (Semaglutide), Byetta (exenatide), Bydureon (exenatide ER), Victoza, Saxenda (liraglutide), or Trulicity (dulaglutide) Mounjaro (Tirzepatide) Adlyxin (Lixisenatide), Polyethylene Glycol Loxenatide.    Reviewed and Endorsed by [facility] Patient Education Committee, August 2015  Bring CPAP mask and tubing day of surgery.                              You may not have any metal on your body including  jewelry, and body piercing             Do not wear  lotions, powders, cologne, or deodorant              Men may shave face and neck.   Do not bring valuables to the hospital. [facility] IS NOT RESPONSIBLE   FOR VALUABLES.   Contacts, dentures or bridgework may not  be worn into surgery.   Bring small overnight bag day of surgery.   DO NOT BRING YOUR HOME MEDICATIONS TO THE HOSPITAL. PHARMACY WILL DISPENSE MEDICATIONS LISTED ON YOUR MEDICATION LIST TO YOU DURING YOUR ADMISSION IN THE HOSPITAL!   Special Instructions: Bring a copy of your healthcare power of attorney and living will documents the day of surgery if you haven't scanned them before.              Please read over the following  fact sheets you were given: IF YOU HAVE QUESTIONS ABOUT YOUR PRE-OP INSTRUCTIONS PLEASE CALL [phone] Philip Black  If you received a COVID test during your pre-op visit  it is requested that you wear a mask when out in public, stay away from anyone that may not be feeling well and notify your surgeon if you develop symptoms. If you test positive for Covid or have been in contact with anyone that has tested positive in the last 10 days please notify you surgeon.    Pre-operative 5 CHG Bath Instructions   You can play a key role in reducing the risk of infection after surgery. Your skin needs to be as free of germs as possible. You can reduce the number of germs on your skin by washing with CHG (chlorhexidine gluconate) soap before surgery. CHG is an antiseptic soap that kills germs and continues to kill germs even after washing.   DO NOT use if you have an allergy to chlorhexidine/CHG or antibacterial soaps. If your skin becomes reddened or irritated, stop using the CHG and notify one of our RNs at [phone].   Please shower with the CHG soap starting 4 days before surgery using the following schedule:     Please keep in mind the following:  DO NOT shave, including legs and underarms, starting the day of your first shower.   You may shave your face at any point before/day of surgery.  Place clean sheets on your bed the day you start using CHG soap. Use a clean washcloth (not used since being washed) for each shower. DO NOT sleep with pets once you start using the CHG.   CHG Shower Instructions:  If you choose to wash your hair and private area, wash first with your normal shampoo/soap.  After you use shampoo/soap, rinse your hair and body thoroughly to remove shampoo/soap residue.  Turn the water OFF and apply about 3 tablespoons (45 ml) of CHG soap to a CLEAN washcloth.  Apply CHG soap ONLY FROM YOUR NECK DOWN TO YOUR TOES (washing for 3-5 minutes)  DO NOT use CHG soap on face, private  areas, open wounds, or sores.  Pay special attention to the area where your surgery is being performed.  If you are having back surgery, having someone wash your back for you may be helpful. Wait 2 minutes after CHG soap is applied, then you may rinse off the CHG soap.  Pat dry with a clean towel  Put on clean clothes/pajamas   If you choose to wear lotion, please use ONLY the CHG-compatible lotions on the back of this paper.     Additional instructions for the day of surgery: DO NOT APPLY any lotions, deodorants, cologne, or perfumes.   Put on clean/comfortable clothes.  Brush your teeth.  Ask your nurse before applying any prescription medications to the skin.      CHG Compatible Lotions   Aveeno Moisturizing lotion  Cetaphil Moisturizing Cream  Cetaphil Moisturizing Lotion  Clairol Herbal Essence  Moisturizing Lotion, Dry Skin  Clairol Herbal Essence Moisturizing Lotion, Extra Dry Skin  Clairol Herbal Essence Moisturizing Lotion, Normal Skin  Curel Age Defying Therapeutic Moisturizing Lotion with Alpha Hydroxy  Curel Extreme Care Body Lotion  Curel Soothing Hands Moisturizing Hand Lotion  Curel Therapeutic Moisturizing Cream, Fragrance-Free  Curel Therapeutic Moisturizing Lotion, Fragrance-Free  Curel Therapeutic Moisturizing Lotion, Original Formula  Eucerin Daily Replenishing Lotion  Eucerin Dry Skin Therapy Plus Alpha Hydroxy Crme  Eucerin Dry Skin Therapy Plus Alpha Hydroxy Lotion  Eucerin Original Crme  Eucerin Original Lotion  Eucerin Plus Crme Eucerin Plus Lotion  Eucerin TriLipid Replenishing Lotion  Keri Anti-Bacterial Hand Lotion  Keri Deep Conditioning Original Lotion Dry Skin Formula Softly Scented  Keri Deep Conditioning Original Lotion, Fragrance Free Sensitive Skin Formula  Keri Lotion Fast Absorbing Fragrance Free Sensitive Skin Formula  Keri Lotion Fast Absorbing Softly Scented Dry Skin Formula  Keri Original Lotion  Keri Skin Renewal Lotion Keri  Silky Smooth Lotion  Keri Silky Smooth Sensitive Skin Lotion  Nivea Body Creamy Conditioning Oil  Nivea Body Extra Enriched Lotion  Nivea Body Original Lotion  Nivea Body Sheer Moisturizing Lotion Nivea Crme  Nivea Skin Firming Lotion  NutraDerm 30 Skin Lotion  NutraDerm Skin Lotion  NutraDerm Therapeutic Skin Cream  NutraDerm Therapeutic Skin Lotion  ProShield Protective Hand Cream  Provon moisturizing lotion   PATIENT SIGNATURE_________________________________  NURSE SIGNATURE__________________________________  ________________________________________________________________________    Philip Black  An incentive spirometer is a tool that can help keep your lungs clear and active. This tool measures how well you are filling your lungs with each breath. Taking long deep breaths may help reverse or decrease the chance of developing breathing (pulmonary) problems (especially infection) following: A long period of time when you are unable to move or be active. BEFORE THE PROCEDURE  If the spirometer includes an indicator to show your best effort, your nurse or respiratory therapist will set it to a desired goal. If possible, sit up straight or lean slightly forward. Try not to slouch. Hold the incentive spirometer in an upright position. INSTRUCTIONS FOR USE  Sit on the edge of your bed if possible, or sit up as far as you can in bed or on a chair. Hold the incentive spirometer in an upright position. Breathe out normally. Place the mouthpiece in your mouth and seal your lips tightly around it. Breathe in slowly and as deeply as possible, raising the piston or the ball toward the top of the column. Hold your breath for 3-5 seconds or for as long as possible. Allow the piston or ball to fall to the bottom of the column. Remove the mouthpiece from your mouth and breathe out normally. Rest for a few seconds and repeat Steps 1 through 7 at least 10 times every 1-2 hours when  you are awake. Take your time and take a few normal breaths between deep breaths. The spirometer may include an indicator to show your best effort. Use the indicator as a goal to work toward during each repetition. After each set of 10 deep breaths, practice coughing to be sure your lungs are clear. If you have an incision (the cut made at the time of surgery), support your incision when coughing by placing a pillow or rolled up towels firmly against it. Once you are able to get out of bed, walk around indoors and cough well. You may stop using the incentive spirometer when instructed by your caregiver.  RISKS AND COMPLICATIONS Take your time so  you do not get dizzy or light-headed. If you are in pain, you may need to take or ask for pain medication before doing incentive spirometry. It is harder to take a deep breath if you are having pain. AFTER USE Rest and breathe slowly and easily. It can be helpful to keep track of a log of your progress. Your caregiver can provide you with a simple table to help with this. If you are using the spirometer at home, follow these instructions: SEEK MEDICAL CARE IF:  You are having difficultly using the spirometer. You have trouble using the spirometer as often as instructed. Your pain medication is not giving enough relief while using the spirometer. You develop fever of 100.5 F (38.1 C) or higher. SEEK IMMEDIATE MEDICAL CARE IF:  You cough up bloody sputum that had not been present before. You develop fever of 102 F (38.9 C) or greater. You develop worsening pain at or near the incision site. MAKE SURE YOU:  Understand these instructions. Will watch your condition. Will get help right away if you are not doing well or get worse. Document Released: 02/02/2007 Document Revised: 12/15/2011 Document Reviewed: 04/05/2007 [employer] Patient Information 2014 [employer], [place].   ________________________________________________________________________ WHAT  IS A BLOOD TRANSFUSION? Blood Transfusion Information  A transfusion is the replacement of blood or some of its parts. Blood is made up of multiple cells which provide different functions. Red blood cells carry oxygen and are used for blood loss replacement. White blood cells fight against infection. Platelets control bleeding. Plasma helps clot blood. Other blood products are available for specialized needs, such as hemophilia or other clotting disorders. BEFORE THE TRANSFUSION  Who gives blood for transfusions?  Healthy volunteers who are fully evaluated to make sure their blood is safe. This is blood bank blood. Transfusion therapy is the safest it has ever been in the practice of medicine. Before blood is taken from a donor, a complete history is taken to make sure that person has no history of diseases nor engages in risky social behavior (examples are intravenous drug use or sexual activity with multiple partners). The donor's travel history is screened to minimize risk of transmitting infections, such as malaria. The donated blood is tested for signs of infectious diseases, such as HIV and hepatitis. The blood is then tested to be sure it is compatible with you in order to minimize the chance of a transfusion reaction. If you or a relative donates blood, this is often done in anticipation of surgery and is not appropriate for emergency situations. It takes many days to process the donated blood. RISKS AND COMPLICATIONS Although transfusion therapy is very safe and saves many lives, the main dangers of transfusion include:  Getting an infectious disease. Developing a transfusion reaction. This is an allergic reaction to something in the blood you were given. Every precaution is taken to prevent this. The decision to have a blood transfusion has been considered carefully by your caregiver before blood is given. Blood is not given unless the benefits outweigh the risks. AFTER THE  TRANSFUSION Right after receiving a blood transfusion, you will usually feel much better and more energetic. This is especially true if your red blood cells have gotten low (anemic). The transfusion raises the level of the red blood cells which carry oxygen, and this usually causes an energy increase. The nurse administering the transfusion will monitor you carefully for complications. HOME CARE INSTRUCTIONS  No special instructions are needed after a transfusion. You may find  your energy is better. Speak with your caregiver about any limitations on activity for underlying diseases you may have. SEEK MEDICAL CARE IF:  Your condition is not improving after your transfusion. You develop redness or irritation at the intravenous (IV) site. SEEK IMMEDIATE MEDICAL CARE IF:  Any of the following symptoms occur over the next 12 hours: Shaking chills. You have a temperature by mouth above 102 F (38.9 C), not controlled by medicine. Chest, back, or muscle pain. People around you feel you are not acting correctly or are confused. Shortness of breath or difficulty breathing. Dizziness and fainting. You get a rash or develop hives. You have a decrease in urine output. Your urine turns a dark color or changes to pink, red, or brown. Any of the following symptoms occur over the next 10 days: You have a temperature by mouth above 102 F (38.9 C), not controlled by medicine. Shortness of breath. Weakness after normal activity. The white part of the eye turns yellow (jaundice). You have a decrease in the amount of urine or are urinating less often. Your urine turns a dark color or changes to pink, red, or brown. Document Released: 09/19/2000 Document Revised: 12/15/2011 Document Reviewed: 05/08/2008 [facility] Patient Information 2014 [place], [place].  _______________________________________________________________________

## 2023-08-25 NOTE — Progress Notes (Signed)
COVID Vaccine Completed:  Yes  Date of COVID positive in last 90 days:  PCP - Farris Has, MD Cardiologist - Verne Carrow, MD  Cardiac clearance in Epic dated 07-23-23 by Bernadene Person, NP  Chest x-ray -  EKG - 07-23-23 Epic Stress Test -  ECHO -  10-17-21 Epic Cardiac Cath - 05-07-16 Epic Pacemaker/ICD device last checked: Spinal Cord Stimulator:  Bowel Prep -   Sleep Study - Yes, +sleep apnea CPAP -   Fasting Blood Sugar -  Checks Blood Sugar _____ times a day  Semaglutide or Trulicity Last dose of GLP1 agonist-  09-01-23 GLP1 instructions:  Hold 7 days before surgery    Jardiance Last dose of SGLT-2 inhibitors-  09-05-23 SGLT-2 instructions:  Hold 3 days before surgery    Blood Thinner Instructions:  Time Aspirin Instructions:  ASA 81 mg  Last Dose:  Activity level:  Can go up a flight of stairs and perform activities of daily living without stopping and without symptoms of chest pain or shortness of breath.  Able to exercise without symptoms  Unable to go up a flight of stairs without symptoms of     Anesthesia review:   CAD with hx of MI, HTN, DM, OSA  Patient denies shortness of breath, fever, cough and chest pain at PAT appointment  Patient verbalized understanding of instructions that were given to them at the PAT appointment. Patient was also instructed that they will need to review over the PAT instructions again at home before surgery.

## 2023-08-26 ENCOUNTER — Other Ambulatory Visit (HOSPITAL_COMMUNITY): Payer: Self-pay

## 2023-08-27 ENCOUNTER — Encounter (HOSPITAL_COMMUNITY)
Admission: RE | Admit: 2023-08-27 | Discharge: 2023-08-27 | Disposition: A | Discharge status: Home / Self Care | Payer: Federal, State, Local not specified - PPO | Source: Ambulatory Visit | Attending: Family Medicine | Admitting: Family Medicine

## 2023-08-27 DIAGNOSIS — E119 Type 2 diabetes mellitus without complications: Secondary | ICD-10-CM

## 2023-08-27 DIAGNOSIS — Z01818 Encounter for other preprocedural examination: Secondary | ICD-10-CM

## 2023-08-31 ENCOUNTER — Other Ambulatory Visit: Payer: Self-pay

## 2023-08-31 ENCOUNTER — Other Ambulatory Visit (HOSPITAL_COMMUNITY): Payer: Self-pay

## 2023-09-01 ENCOUNTER — Other Ambulatory Visit (HOSPITAL_COMMUNITY): Payer: Self-pay

## 2023-09-04 ENCOUNTER — Other Ambulatory Visit (HOSPITAL_COMMUNITY): Payer: Self-pay

## 2023-09-04 ENCOUNTER — Other Ambulatory Visit: Payer: Self-pay

## 2023-09-05 ENCOUNTER — Other Ambulatory Visit (HOSPITAL_COMMUNITY): Payer: Self-pay

## 2023-09-09 ENCOUNTER — Other Ambulatory Visit (HOSPITAL_COMMUNITY): Payer: Self-pay

## 2023-09-09 DIAGNOSIS — M1611 Unilateral primary osteoarthritis, right hip: Secondary | ICD-10-CM

## 2023-09-11 ENCOUNTER — Other Ambulatory Visit (HOSPITAL_COMMUNITY): Payer: Self-pay

## 2023-09-12 ENCOUNTER — Other Ambulatory Visit (HOSPITAL_COMMUNITY): Payer: Self-pay

## 2023-09-14 ENCOUNTER — Other Ambulatory Visit (HOSPITAL_COMMUNITY): Payer: Self-pay

## 2023-09-15 ENCOUNTER — Other Ambulatory Visit (HOSPITAL_COMMUNITY): Payer: Self-pay

## 2023-09-16 ENCOUNTER — Other Ambulatory Visit: Payer: Self-pay | Admitting: Cardiovascular Disease

## 2023-09-16 ENCOUNTER — Other Ambulatory Visit (HOSPITAL_COMMUNITY): Payer: Self-pay

## 2023-09-16 MED ORDER — EZETIMIBE 10 MG PO TABS
10.0000 mg | ORAL_TABLET | Freq: Every day | ORAL | 2 refills | Status: DC
  Filled 2023-09-16: qty 30, 30d supply, fill #0
  Filled 2023-10-21: qty 30, 30d supply, fill #1
  Filled 2023-11-23: qty 30, 30d supply, fill #2

## 2023-09-21 ENCOUNTER — Telehealth: Payer: Self-pay | Admitting: *Deleted

## 2023-09-21 NOTE — Telephone Encounter (Signed)
   Pre-operative Risk Assessment    Patient Name: Philip Black  DOB: [birth year] MRN: [id number]  DATE OF LAST VISIT: 07/23/23 Bernadene Person, NP DATE OF NEXT VISIT: 11/10/23 Bernadene Person, NP    Request for Surgical Clearance    Procedure:   RIGHT TOTAL HIP ARTHROPLASTY  Date of Surgery:  Clearance 12/23/23                                 Surgeon:  DR. Ollen Gross Surgeon's Group or Practice Name:  [facility] Phone number:  [phone] Aida Raider Fax number:  [phone]   Type of Clearance Requested:   - Medical  - Pharmacy:  Hold Aspirin     Type of Anesthesia:   CHOICE   Additional requests/questions:    Elpidio Anis   09/21/2023, 5:10 PM

## 2023-09-22 NOTE — Telephone Encounter (Signed)
   Name: Philip Black  DOB: 1956-07-22  MRN: [id number]  Primary Cardiologist: Verne Carrow, MD  Chart reviewed as part of pre-operative protocol coverage. The patient has an upcoming visit scheduled with Bernadene Person, NP on 11/10/2023 at which time clearance can be addressed in case there are any issues that would impact surgical recommendations.  RIGHT TOTAL HIP ARTHROPLASTY is not scheduled until 12/23/2023 as below. I added preop FYI to appointment note so that provider is aware to address at time of outpatient visit.  Per office protocol the cardiology provider should forward their finalized clearance decision and recommendations regarding antiplatelet therapy to the requesting party below.    Regarding ASA therapy, we recommend continuation of ASA throughout the perioperative period.  However, if the surgeon feels that cessation of ASA is required in the perioperative period, it may be stopped 5-7 days prior to surgery with a plan to resume it as soon as felt to be feasible from a surgical standpoint in the post-operative period.  I will route this message as FYI to requesting party and remove this message from the preop box as separate preop APP input not needed at this time.     Please call with any questions.  Joylene Grapes, NP  09/22/2023, 4:54 PM

## 2023-09-25 ENCOUNTER — Other Ambulatory Visit (HOSPITAL_COMMUNITY): Payer: Self-pay

## 2023-09-25 ENCOUNTER — Other Ambulatory Visit: Payer: Self-pay | Admitting: Physician Assistant

## 2023-09-25 MED ORDER — LISINOPRIL 40 MG PO TABS
40.0000 mg | ORAL_TABLET | Freq: Every day | ORAL | 3 refills | Status: DC
  Filled 2023-09-25: qty 90, 90d supply, fill #0
  Filled 2024-01-05: qty 90, 90d supply, fill #1
  Filled 2024-04-12: qty 90, 90d supply, fill #2
  Filled 2024-07-11: qty 90, 90d supply, fill #3

## 2023-10-06 ENCOUNTER — Other Ambulatory Visit (HOSPITAL_COMMUNITY): Payer: Self-pay

## 2023-10-08 ENCOUNTER — Other Ambulatory Visit (HOSPITAL_BASED_OUTPATIENT_CLINIC_OR_DEPARTMENT_OTHER): Payer: Self-pay

## 2023-10-08 ENCOUNTER — Other Ambulatory Visit (HOSPITAL_COMMUNITY): Payer: Self-pay

## 2023-10-08 MED ORDER — SILDENAFIL CITRATE 100 MG PO TABS
ORAL_TABLET | ORAL | 5 refills | Status: DC
  Filled 2023-10-08: qty 8, 8d supply, fill #0
  Filled 2024-01-08: qty 8, 8d supply, fill #1
  Filled 2024-02-15: qty 8, 8d supply, fill #2
  Filled 2024-04-12: qty 8, 8d supply, fill #3
  Filled 2024-07-09: qty 8, 8d supply, fill #4

## 2023-10-13 ENCOUNTER — Other Ambulatory Visit: Payer: Self-pay | Admitting: Physician Assistant

## 2023-10-13 ENCOUNTER — Other Ambulatory Visit (HOSPITAL_COMMUNITY): Payer: Self-pay

## 2023-10-13 MED ORDER — AMLODIPINE BESYLATE 5 MG PO TABS
5.0000 mg | ORAL_TABLET | Freq: Every day | ORAL | 2 refills | Status: DC
  Filled 2023-10-13: qty 90, 90d supply, fill #0
  Filled 2024-01-11: qty 90, 90d supply, fill #1
  Filled 2024-04-12: qty 90, 90d supply, fill #2

## 2023-10-22 ENCOUNTER — Other Ambulatory Visit (HOSPITAL_COMMUNITY): Payer: Self-pay

## 2023-11-09 NOTE — Progress Notes (Deleted)
 Office Visit    Patient Name: Philip Black Date of Encounter: 11/09/2023  Primary Care Provider:  Kip Righter, MD Primary Cardiologist:  Lonni Cash, MD  Chief Complaint    68 year old male with a history of CAD s/p DES-LAD in 2009, DES-LCx in 2017, hypertension, hyperlipidemia, type 2 diabetes, and OSA who presents for follow-up related to CAD and for preoperative cardiac evaluation.   Past Medical History    Past Medical History:  Diagnosis Date   Arthritis    shoulders, right hip (05/07/2016)   Back pain    CAD S/P percutaneous coronary angioplasty    a. DES to LAD in 2009 b. cath: 05/2016 w/ patent stent to LAD, 99% stenosis Prox Cx (treated w/ DES), and 99% stenosis of small caliber non-dominant RCA (no intervention performed).   Carpal tunnel syndrome    Coronary artery disease involving native coronary artery of native heart with unstable angina pectoris (HCC) 06/26/2010   Qualifier: Diagnosis of  By: Obie, MD, CODY Wolm Gosling    High cholesterol    Hip pain    History of heart attack    Hypertension    Hypertension associated with diabetes (HCC) 06/26/2010   Qualifier: Diagnosis of  By: Obie, MD, CODY Wolm Gosling    Metabolic syndrome    OSA (obstructive sleep apnea)    used mask; lost weight; stopped using mask (05/07/2016)   Tinnitus    Type II diabetes mellitus (HCC)    Unstable angina (HCC) 05/07/2016   Past Surgical History:  Procedure Laterality Date   CARDIAC CATHETERIZATION N/A 05/07/2016   Procedure: Left Heart Cath and Coronary Angiography;  Surgeon: Lonni JONETTA Cash, MD;  Location: [facility] INVASIVE CV LAB;  Service: Cardiovascular;  Laterality: N/A;   CARDIAC CATHETERIZATION N/A 05/07/2016   Procedure: Coronary Stent Intervention;  Surgeon: Lonni JONETTA Cash, MD;  Location: [facility] INVASIVE CV LAB;  Service: Cardiovascular;  Laterality: N/A;   CORONARY ANGIOPLASTY WITH STENT PLACEMENT  02/2008; 05/07/2016   TONSILLECTOMY  ~ 1969   WRIST  SURGERY      Allergies  Allergies  Allergen Reactions   Canagliflozin-Metformin  Hcl Nausea And Vomiting     Labs/Other Studies Reviewed    The following studies were reviewed today:  Cardiac Studies & Procedures   CARDIAC CATHETERIZATION  CARDIAC CATHETERIZATION 05/07/2016  Narrative  Mid RCA lesion, 99 %stenosed.  Prox RCA lesion, 90 %stenosed.  Ost 2nd Mrg to 2nd Mrg lesion, 80 %stenosed.  4th Mrg lesion, 50 %stenosed.  Ost 1st Mrg lesion, 50 %stenosed.  1st Mrg lesion, 50 %stenosed.  Ost LAD to Prox LAD lesion, 10 %stenosed.  A STENT PROMUS PREM MR 3.0X12 drug eluting stent was successfully placed.  Ost Cx to Prox Cx lesion, 99 %stenosed.  Post intervention, there is a 0% residual stenosis.  Prox LAD lesion, 30 %stenosed.  The left ventricular systolic function is normal.  LV end diastolic pressure is normal.  The left ventricular ejection fraction is 50-55% by visual estimate.  There is no mitral valve regurgitation.  1. Triple vessel CAD 2. Patent stent proximal LAD with minimal restenosis 3. Severe stenosis ostial/proximal Circumflex 4. Successful PTCA/DES x 1 proximal Circumflex 5. Severe stenosis small caliber non-dominant RCA 6. Low normal LV systolic function  Recommendations: Will start Brilinta  and ASA and Brilinta  for now. He will be enrolled in the TWILIGHT study. Continue statin.  Findings Coronary Findings Diagnostic  Dominance: Left  Left Anterior Descending The lesion was previously treated using a drug eluting  stent over 2 years ago. The lesion is discrete.  Lateral First Diagonal Branch Vessel is small in size.  Second Diagonal Branch Vessel is small in size.  Left Circumflex The lesion is discrete.  First Obtuse Marginal Branch Vessel is moderate in size. The lesion is discrete.  Second Obtuse Marginal Branch Vessel is small in size.  Fourth Obtuse Marginal Branch Vessel is moderate in size.  Left Posterior  Descending Artery Vessel is moderate in size.  Right Coronary Artery Vessel is small. The lesion is discrete. The lesion is discrete.  Intervention  Ost Cx to Prox Cx lesion Angioplasty Lesion crossed with guidewire. Pre-stent angioplasty was performed using a BALLOON [facility]ER[facility] MR 2.5X12. A STENT PROMUS PREM MR 3.0X12 drug eluting stent was successfully placed. Stent strut is well apposed. Post-stent angioplasty was performed using a BALLOON [place] EMERGE MR 3.25X8. The pre-interventional distal flow is normal (TIMI 3).  The post-interventional distal flow is normal (TIMI 3). The intervention was successful . No complications occurred at this lesion. There is a 0% residual stenosis post intervention.    ECHOCARDIOGRAM  ECHOCARDIOGRAM COMPLETE 10/17/2021  Narrative ECHOCARDIOGRAM REPORT    Patient Name:   Philip Black Date of Exam: 10/17/2021 Medical Rec #:  [id number]     Height:       70.0 in Accession #:    [id number]    Weight:       251.4 lb Date of Birth:  10-13-1955    BSA:          2.300 m Patient Age:    65 years      BP:           148/88 mmHg Patient Gender: M             HR:           75 bpm. Exam Location:  [address]  Procedure: 2D Echo, 3D Echo, Cardiac Doppler, Color Doppler and Strain Analysis  Indications:    I25.1 CAD  History:        Patient has no prior history of Echocardiogram examinations. CAD; Risk Factors:Hypertension, Dyslipidemia, Obesity and Sleep Apnea.  Sonographer:    Marshia Lawyer BS, RDCS Referring Phys: [id number] CHRISTOPHER D MCALHANY  IMPRESSIONS   1. Left ventricular ejection fraction, by estimation, is 60 to 65%. The left ventricle has normal function. The left ventricle has no regional wall motion abnormalities. There is moderate left ventricular hypertrophy. Left ventricular diastolic parameters are consistent with Grade I diastolic dysfunction (impaired relaxation). The average left ventricular global longitudinal strain is -17.0 %. The  global longitudinal strain is normal. 2. Right ventricular systolic function is normal. The right ventricular size is normal. 3. The mitral valve is grossly normal. Trivial mitral valve regurgitation. 4. The aortic valve is normal in structure. Aortic valve regurgitation is not visualized.  FINDINGS Left Ventricle: Left ventricular ejection fraction, by estimation, is 60 to 65%. The left ventricle has normal function. The left ventricle has no regional wall motion abnormalities. The average left ventricular global longitudinal strain is -17.0 %. The global longitudinal strain is normal. 3D left ventricular ejection fraction analysis performed but not reported based on interpreter judgement due to suboptimal tracking. The left ventricular internal cavity size was normal in size. There is moderate left ventricular hypertrophy. Left ventricular diastolic parameters are consistent with Grade I diastolic dysfunction (impaired relaxation).  Right Ventricle: The right ventricular size is normal. Right vetricular wall thickness was not well visualized. Right ventricular systolic function  is normal.  Left Atrium: Left atrial size was normal in size.  Right Atrium: Right atrial size was normal in size.  Pericardium: There is no evidence of pericardial effusion.  Mitral Valve: The mitral valve is grossly normal. Trivial mitral valve regurgitation.  Tricuspid Valve: The tricuspid valve is grossly normal. Tricuspid valve regurgitation is trivial.  Aortic Valve: The aortic valve is normal in structure. Aortic valve regurgitation is not visualized.  Pulmonic Valve: The pulmonic valve was normal in structure. Pulmonic valve regurgitation is not visualized.  Aorta: The aortic root and ascending aorta are structurally normal, with no evidence of dilitation.  IAS/Shunts: The atrial septum is grossly normal.   LEFT VENTRICLE PLAX 2D LVIDd:         4.90 cm   Diastology LVIDs:         3.60 cm   LV e'  medial:    4.79 cm/s LV PW:         1.40 cm   LV E/e' medial:  15.7 LV IVS:        1.40 cm   LV e' lateral:   6.31 cm/s LVOT diam:     2.40 cm   LV E/e' lateral: 11.9 LV SV:         57 LV SV Index:   25        2D Longitudinal Strain LVOT Area:     4.52 cm  2D Strain GLS (A2C):   -14.7 % 2D Strain GLS (A3C):   -19.5 % 2D Strain GLS (A4C):   -16.7 % 2D Strain GLS Avg:     -17.0 %  3D Volume EF: 3D EF:        50 % LV EDV:       170 ml LV ESV:       85 ml LV SV:        85 ml  RIGHT VENTRICLE             IVC RV Basal diam:  2.80 cm     IVC diam: 1.30 cm RV S prime:     11.50 cm/s TAPSE (M-mode): 2.2 cm  LEFT ATRIUM           Index        RIGHT ATRIUM           Index LA diam:      4.70 cm 2.04 cm/m   RA Pressure: 3.00 mmHg LA Vol (A2C): 33.6 ml 14.61 ml/m  RA Area:     13.60 cm LA Vol (A4C): 19.7 ml 8.56 ml/m   RA Volume:   29.80 ml  12.95 ml/m AORTIC VALVE LVOT Vmax:   67.25 cm/s LVOT Vmean:  45.200 cm/s LVOT VTI:    0.126 m  AORTA Ao Root diam: 3.70 cm Ao Asc diam:  3.50 cm  MITRAL VALVE                TRICUSPID VALVE Estimated RAP:  3.00 mmHg MV Decel Time: 330 msec MV E velocity: 75.20 cm/s   SHUNTS MV A velocity: 121.00 cm/s  Systemic VTI:  0.13 m MV E/A ratio:  0.62         Systemic Diam: 2.40 cm  Aleene Passe MD Electronically signed by Aleene Passe MD Signature Date/Time: 10/17/2021/2:58:44 PM    Final            Recent Labs: No results found for requested labs within last 365 days.  Recent Lipid Panel  Component Value Date/Time   CHOL 152 08/03/2018 0756   TRIG 83 08/03/2018 0756   HDL 66 08/03/2018 0756   CHOLHDL 2.3 08/03/2018 0756   CHOLHDL 2.2 10/24/2013 1152   VLDL 12 10/24/2013 1152   LDLCALC 69 08/03/2018 0756   LDLDIRECT 64 08/08/2009 0000    History of Present Illness    68 year old male with the above past medical history including CAD s/p DES-LAD in 2009, DES-LCx in 2017, hypertension, hyperlipidemia, type 2 diabetes, and  OSA.   He was hospitalized in May 2009 in the setting of anterior MI, treated with a drug-eluting stent to the LAD. Cardiac catheterization in August 2017 in the setting of unstable angina revealed severe stenosis of the proximal circumflex treated with a drug-eluting stent as well as severe stenosis in a small caliber nondominant RCA, managed medically.  LV function was normal by LV gram.  He has been intolerant to high-dose beta-blocker in the setting of bradycardia. He was last seen in the office on 07/23/2023 and was stable from a cardiac standpoint.  He noted occasional chest discomfort after sexual intercourse, he denied any other exertional symptoms.  Ischemic evaluation was deferred.   He presents today for follow-up and for preoperative cardiac evaluation for upcoming right total hip arthroplasty with Dr. Dempsey Moan of [facility] scheduled for 12/23/2023 with request to hold aspirin  prior to surgery. Since his last visit he has   1. CAD: S/p DES-LAD in 2009, DES-LCx in 2017. He has noted occasional chest discomfort after sexual intercourse, denies any other exertional symptoms. We discussed that should his symptoms progress, could consider further ischemic evaluation (i.e. cardiac PET stress test).  For now, continue to monitor symptoms.  Continue aspirin , amlodipine , metoprolol , lisinopril , WelChol , and Zetia .   2. Hypertension: BP well controlled. Continue current antihypertensive regimen.    3. Hyperlipidemia: LDL was 58 in 12/2022.  Continue WelChol , Zetia .   4. Type 2 diabetes: A1c was 8.2 in 05/2023.  Monitored and managed per PCP.   5. OSA: No longer on CPAP.   6. Preoperative cardiac exam: He notes that he will likely have hip surgery in December 2024.  According to the Revised Cardiac Risk Index (RCRI), his Perioperative Risk of Major Cardiac Event is (%): 6.6. He is able to complete greater than 4 METS. Therefore, based on ACC/AHA guidelines, patient would be at acceptable risk  for the planned procedure without further cardiovascular testing.    7. Disposition: Follow-up in  Home Medications    Current Outpatient Medications  Medication Sig Dispense Refill   amLODipine  (NORVASC ) 5 MG tablet Take 1 tablet (5 mg total) by mouth daily. 90 tablet 2   aspirin  EC 81 MG tablet Take 1 tablet (81 mg total) daily by mouth. 90 tablet 3   bisacodyl  (DULCOLAX) 5 MG EC tablet Take as directed. (Patient not taking: Reported on 08/25/2023) 4 tablet 0   colesevelam  (WELCHOL ) 625 MG tablet Take 2 tablets (1,250 mg total) by mouth daily with a meal 180 tablet 2   Continuous Blood Gluc Sensor (FREESTYLE LIBRE 14 DAY SENSOR) MISC Change sensor every 14 days 2 each 11   Continuous Glucose Receiver (FREESTYLE LIBRE 3 READER) DEVI use as directed 1 each 0   Continuous Glucose Receiver (FREESTYLE LIBRE 3 READER) DEVI Use to check blood glucose 1 each 0   Continuous Glucose Sensor (FREESTYLE LIBRE 3 SENSOR) MISC change sensor every 14 days 6 each 4   COVID-19 mRNA Vac-TriS, Pfizer, (PFIZER-BIONT COVID-19 VAC-TRIS) SUSP injection  Inject into the muscle. (Patient not taking: Reported on 08/25/2023) 0.3 mL 0   Dulaglutide  (TRULICITY ) 4.5 MG/0.5ML SOPN Inject 4.5 mg into the skin once a week. (Patient not taking: Reported on 08/25/2023) 6 mL 3   empagliflozin  (JARDIANCE ) 25 MG TABS tablet Take 1 tablet (25 mg total) by mouth daily. 90 tablet 3   ergocalciferol  (VITAMIN D2) 1.25 MG (50000 UT) capsule Take 1 capsule by mouth  once a week until 07/22/2022 8 capsule 0   ezetimibe  (ZETIA ) 10 MG tablet Take 1 tablet (10 mg total) by mouth daily. Appointment needed for future refills. Thank you 30 tablet 2   insulin  aspart protamine - aspart (NOVOLOG  MIX 70/30 FLEXPEN) (70-30) 100 UNIT/ML FlexPen Inject 80 units under the skin at breakfast, 20 units at lunch, 90 units at evening meal (Patient taking differently: Inject 50-90 Units into the skin See admin instructions. Inject 90 units under the skin in the  morning and 50 units in the late afternoon and 50 units in the evening) 180 mL 3   Insulin  Pen Needle (TECHLITE PEN NEEDLES) 32G X 4 MM MISC Use as directed 2 times a day 200 each 5   lisinopril  (ZESTRIL ) 40 MG tablet Take 1 tablet (40 mg total) by mouth daily. 90 tablet 3   methocarbamol  (ROBAXIN ) 500 MG tablet Take 1 tablet by mouth 4 times a day. (Patient not taking: Reported on 08/25/2023) 60 tablet 0   metoprolol  tartrate (LOPRESSOR ) 25 MG tablet TAKE 1 TABLET BY MOUTH 2 TIMES DAILY. 180 tablet 2   nitroGLYCERIN  (NITROSTAT ) 0.4 MG SL tablet Place 1 tablet (0.4 mg total) under the tongue every 5 (five) minutes as needed for chest pain (X 3 DOSES MAX). (Patient not taking: Reported on 08/25/2023) 25 tablet 6   polyethylene glycol-electrolytes (NULYTELY) 420 g solution Take as directed per package instructions. 4000 mL 0   rosuvastatin  (CRESTOR ) 40 MG tablet Take 1 tablet (40 mg total) by mouth daily. 90 tablet 3   Semaglutide ,0.25 or 0.5MG /DOS, (OZEMPIC , 0.25 OR 0.5 MG/DOSE,) 2 MG/3ML SOPN Inject 0.25 mg into the skin for first four weeks then 0.5 mg Subcutaneous Once a week 9 mL 4   sildenafil  (VIAGRA ) 100 MG tablet Take 0.5-1 tablets (50-100 mg total) by mouth 30-60 minutes prior to intercourse as directed. 8 tablet 5   Vitamin D , Ergocalciferol , (DRISDOL ) 1.25 MG (50000 UT) CAPS capsule Take 1 capsule (50,000 Units total) by mouth every 7 (seven) days. (Patient not taking: Reported on 08/25/2023) 4 capsule 0   No current facility-administered medications for this visit.     Review of Systems    ***.  All other systems reviewed and are otherwise negative except as noted above.    Physical Exam    VS:  There were no vitals taken for this visit. , BMI There is no height or weight on file to calculate BMI.     GEN: Well nourished, well developed, in no acute distress. HEENT: normal. Neck: Supple, no JVD, carotid bruits, or masses. Cardiac: RRR, no murmurs, rubs, or gallops. No clubbing,  cyanosis, edema.  Radials/DP/PT 2+ and equal bilaterally.  Respiratory:  Respirations regular and unlabored, clear to auscultation bilaterally. GI: Soft, nontender, nondistended, BS + x 4. MS: no deformity or atrophy. Skin: warm and dry, no rash. Neuro:  Strength and sensation are intact. Psych: Normal affect.  Accessory Clinical Findings    ECG personally reviewed by me today -    - no acute changes.   Lab Results  Component Value Date   WBC 11.2 (H) 05/22/2020   HGB 16.0 05/22/2020   HCT 49.4 05/22/2020   MCV 96 05/22/2020   PLT 226 05/22/2020   Lab Results  Component Value Date   CREATININE 1.34 (H) 06/26/2020   BUN 24 06/26/2020   NA 139 06/26/2020   K 4.3 06/26/2020   CL 100 06/26/2020   CO2 23 06/26/2020   Lab Results  Component Value Date   ALT 24 12/13/2018   AST 35 12/13/2018   ALKPHOS 65 12/13/2018   BILITOT 0.5 12/13/2018   Lab Results  Component Value Date   CHOL 152 08/03/2018   HDL 66 08/03/2018   LDLCALC 69 08/03/2018   LDLDIRECT 64 08/08/2009   TRIG 83 08/03/2018   CHOLHDL 2.3 08/03/2018    Lab Results  Component Value Date   HGBA1C 8.2 (H) 12/13/2018    Assessment & Plan    1.  ***  No BP recorded.  {Refresh Note OR Click here to enter BP  :1}***   Damien JAYSON Braver, NP 11/09/2023, 11:13 AM

## 2023-11-10 ENCOUNTER — Ambulatory Visit: Payer: Federal, State, Local not specified - PPO | Attending: Nurse Practitioner | Admitting: Nurse Practitioner

## 2023-11-10 DIAGNOSIS — Z01810 Encounter for preprocedural cardiovascular examination: Secondary | ICD-10-CM

## 2023-11-10 DIAGNOSIS — I10 Essential (primary) hypertension: Secondary | ICD-10-CM

## 2023-11-10 DIAGNOSIS — I2510 Atherosclerotic heart disease of native coronary artery without angina pectoris: Secondary | ICD-10-CM

## 2023-11-10 DIAGNOSIS — E119 Type 2 diabetes mellitus without complications: Secondary | ICD-10-CM

## 2023-11-10 DIAGNOSIS — E785 Hyperlipidemia, unspecified: Secondary | ICD-10-CM

## 2023-11-10 DIAGNOSIS — G4733 Obstructive sleep apnea (adult) (pediatric): Secondary | ICD-10-CM

## 2023-11-13 ENCOUNTER — Other Ambulatory Visit (HOSPITAL_COMMUNITY): Payer: Self-pay

## 2023-11-20 ENCOUNTER — Other Ambulatory Visit: Payer: Self-pay

## 2023-11-20 ENCOUNTER — Other Ambulatory Visit (HOSPITAL_COMMUNITY): Payer: Self-pay

## 2023-11-20 MED ORDER — OZEMPIC (1 MG/DOSE) 4 MG/3ML ~~LOC~~ SOPN
1.0000 mg | PEN_INJECTOR | SUBCUTANEOUS | 4 refills | Status: DC
  Filled 2023-11-20: qty 9, 84d supply, fill #0
  Filled 2024-03-07: qty 9, 84d supply, fill #1
  Filled 2024-05-23: qty 9, 84d supply, fill #2

## 2023-11-26 ENCOUNTER — Other Ambulatory Visit (HOSPITAL_COMMUNITY): Payer: Self-pay

## 2023-11-26 MED ORDER — VITAMIN D (ERGOCALCIFEROL) 1.25 MG (50000 UNIT) PO CAPS
50000.0000 [IU] | ORAL_CAPSULE | ORAL | 0 refills | Status: AC
  Filled 2023-11-26: qty 8, 56d supply, fill #0

## 2023-12-02 NOTE — H&P (Signed)
 TOTAL HIP ADMISSION H&P  Patient is admitted for right total hip arthroplasty.  Subjective:  Chief Complaint: Right hip pain  HPI: Philip Black, 68 y.o. male, has a history of pain and functional disability in the right hip due to arthritis and patient has failed non-surgical conservative treatments for greater than 12 weeks to include NSAID's and/or analgesics, use of assistive devices, and activity modification. Onset of symptoms was gradual, starting  several  years ago with gradually worsening course since that time. The patient noted no past surgery on the right hip. Patient currently rates pain in the right hip at 8 out of 10 with activity. Patient has night pain, worsening of pain with activity and weight bearing, pain that interfers with activities of daily living, and pain with passive range of motion. Patient has evidence of  severe end-stage arthritis in the right hip. The images show a bone-on-bone condition with large osteophyte formation  by imaging studies. This condition presents safety issues increasing the risk of falls.  There is no current active infection.  Patient Active Problem List   Diagnosis Date Noted   Unstable angina (HCC) 05/07/2016   OSA (obstructive sleep apnea) 10/24/2013   Type II diabetes mellitus, uncontrolled 05/14/2011   Obesity (BMI 30-39.9) 05/14/2011   Mixed hyperlipidemia due to type 2 diabetes mellitus (HCC) 06/26/2010   Hypertension associated with diabetes (HCC) 06/26/2010   Coronary artery disease involving native coronary artery of native heart with unstable angina pectoris (HCC) 06/26/2010    Past Medical History:  Diagnosis Date   Arthritis    "shoulders, right hip" (05/07/2016)   Back pain    CAD S/P percutaneous coronary angioplasty    a. DES to LAD in 2009 b. cath: 05/2016 w/ patent stent to LAD, 99% stenosis Prox Cx (treated w/ DES), and 99% stenosis of small caliber non-dominant RCA (no intervention performed).   Carpal tunnel syndrome     Coronary artery disease involving native coronary artery of native heart with unstable angina pectoris (HCC) 06/26/2010   Qualifier: Diagnosis of  By: Juanda Chance, MD, Johny Chess    High cholesterol    Hip pain    History of heart attack    Hypertension    Hypertension associated with diabetes (HCC) 06/26/2010   Qualifier: Diagnosis of  By: Juanda Chance, MD, Johny Chess    Metabolic syndrome    OSA (obstructive sleep apnea)    "used mask; lost weight; stopped using mask" (05/07/2016)   Tinnitus    Type II diabetes mellitus (HCC)    Unstable angina (HCC) 05/07/2016    Past Surgical History:  Procedure Laterality Date   CARDIAC CATHETERIZATION N/A 05/07/2016   Procedure: Left Heart Cath and Coronary Angiography;  Surgeon: Kathleene Hazel, MD;  Location: [facility] INVASIVE CV LAB;  Service: Cardiovascular;  Laterality: N/A;   CARDIAC CATHETERIZATION N/A 05/07/2016   Procedure: Coronary Stent Intervention;  Surgeon: Kathleene Hazel, MD;  Location: [facility] INVASIVE CV LAB;  Service: Cardiovascular;  Laterality: N/A;   CORONARY ANGIOPLASTY WITH STENT PLACEMENT  02/2008; 05/07/2016   TONSILLECTOMY  ~ 1969   WRIST SURGERY      Prior to Admission medications   Medication Sig Start Date End Date Taking? Authorizing Provider  aspirin EC 81 MG tablet Take 1 tablet (81 mg total) daily by mouth. 08/19/17  Yes Kathleene Hazel, MD  colesevelam [facility]) 625 MG tablet Take 2 tablets (1,250 mg total) by mouth daily with a meal 07/27/23  Yes  Continuous Blood Gluc Sensor (FREESTYLE LIBRE 14 DAY SENSOR) MISC Change sensor every 14 days 12/12/22  Yes   Continuous Glucose Receiver (FREESTYLE LIBRE 3 READER) DEVI use as directed 02/20/23  Yes Talmage Coin, MD  Continuous Glucose Receiver (FREESTYLE LIBRE 3 READER) DEVI Use to check blood glucose 05/01/23  Yes   Continuous Glucose Sensor (FREESTYLE LIBRE 3 SENSOR) MISC change sensor every 14 days 02/20/23  Yes Talmage Coin, MD  empagliflozin (JARDIANCE)  25 MG TABS tablet Take 1 tablet (25 mg total) by mouth daily. 02/24/23  Yes   ergocalciferol (VITAMIN D2) 1.25 MG (50000 UT) capsule Take 1 capsule by mouth  once a week until 07/22/2022 05/22/22  Yes   insulin aspart protamine - aspart (NOVOLOG MIX 70/30 FLEXPEN) (70-30) 100 UNIT/ML FlexPen Inject 80 units under the skin at breakfast, 20 units at lunch, 90 units at evening meal Patient taking differently: Inject 50-90 Units into the skin See admin instructions. Inject 90 units under the skin in the morning and 50 units in the late afternoon and 50 units in the evening 01/06/23  Yes   Insulin Pen Needle (TECHLITE PEN NEEDLES) 32G X 4 MM MISC Use as directed 2 times a day 01/09/23  Yes   metoprolol tartrate (LOPRESSOR) 25 MG tablet TAKE 1 TABLET BY MOUTH 2 TIMES DAILY. 09/03/20 08/25/23 Yes Kathleene Hazel, MD  polyethylene glycol-electrolytes (NULYTELY) 420 g solution Take as directed per package instructions. 06/17/23  Yes   rosuvastatin (CRESTOR) 40 MG tablet Take 1 tablet (40 mg total) by mouth daily. 08/14/23  Yes Kathleene Hazel, MD  Semaglutide,0.25 or 0.5MG /DOS, (OZEMPIC, 0.25 OR 0.5 MG/DOSE,) 2 MG/3ML SOPN Inject 0.25 mg into the skin for first four weeks then 0.5 mg Subcutaneous Once a week 08/12/23  Yes   amLODipine (NORVASC) 5 MG tablet Take 1 tablet (5 mg total) by mouth daily. 10/13/23   Joylene Grapes, NP  bisacodyl (DULCOLAX) 5 MG EC tablet Take as directed. Patient not taking: Reported on 08/25/2023 06/17/23     COVID-19 mRNA Vac-TriS, Pfizer, (PFIZER-BIONT COVID-19 VAC-TRIS) SUSP injection Inject into the muscle. Patient not taking: Reported on 08/25/2023 05/03/21   Judyann Munson, MD  Dulaglutide (TRULICITY) 4.5 MG/0.5ML SOPN Inject 4.5 mg into the skin once a week. Patient not taking: Reported on 08/25/2023 04/30/23     ezetimibe (ZETIA) 10 MG tablet Take 1 tablet (10 mg total) by mouth daily. Appointment needed for future refills. Thank you 09/16/23   Kathleene Hazel,  MD  lisinopril (ZESTRIL) 40 MG tablet Take 1 tablet (40 mg total) by mouth daily. 09/25/23   Joylene Grapes, NP  methocarbamol (ROBAXIN) 500 MG tablet Take 1 tablet by mouth 4 times a day. Patient not taking: Reported on 08/25/2023 05/15/21     nitroGLYCERIN (NITROSTAT) 0.4 MG SL tablet Place 1 tablet (0.4 mg total) under the tongue every 5 (five) minutes as needed for chest pain (X 3 DOSES MAX). Patient not taking: Reported on 08/25/2023 07/27/19   Kathleene Hazel, MD  Semaglutide, 1 MG/DOSE, (OZEMPIC, 1 MG/DOSE,) 4 MG/3ML SOPN Inject 1 mg into the skin once a week. 11/20/23     sildenafil (VIAGRA) 100 MG tablet Take 0.5-1 tablets (50-100 mg total) by mouth 30-60 minutes prior to intercourse as directed. 10/08/23     Vitamin D, Ergocalciferol, (DRISDOL) 1.25 MG (50000 UNIT) CAPS capsule Take 1 capsule (50,000 Units total) by mouth once a week. 11/26/23     Vitamin D, Ergocalciferol, (DRISDOL) 1.25 MG (50000  UT) CAPS capsule Take 1 capsule (50,000 Units total) by mouth every 7 (seven) days. Patient not taking: Reported on 08/25/2023 12/27/18   Langston Reusing, MD    Allergies  Allergen Reactions   Canagliflozin-Metformin Hcl Nausea And Vomiting    Social History   Socioeconomic History   Marital status: Married    Spouse name: Addison Whidbee   Number of children: 3   Years of education: Not on file   Highest education level: Not on file  Occupational History   Occupation: Retired  Tobacco Use   Smoking status: Never   Smokeless tobacco: Never  Substance and Sexual Activity   Alcohol use: No   Drug use: No   Sexual activity: Yes  Other Topics Concern   Not on file  Social History Narrative   Not on file   Social Drivers of Health   Financial Resource Strain: Not on file  Food Insecurity: Not on file  Transportation Needs: Not on file  Physical Activity: Not on file  Stress: Not on file  Social Connections: Not on file  Intimate Partner Violence: Not on file     Tobacco Use: Low Risk  (07/23/2023)   Patient History    Smoking Tobacco Use: Never    Smokeless Tobacco Use: Never    Passive Exposure: Not on file   Social History   Substance and Sexual Activity  Alcohol Use No    Family History  Problem Relation Age of Onset   Diabetes Mother    Heart attack Father    Hyperlipidemia Father    Hypertension Father    Heart disease Father     Review of Systems  Constitutional:  Negative for chills and fever.  HENT:  Negative for congestion, sore throat and tinnitus.   Eyes:  Negative for double vision, photophobia and pain.  Respiratory:  Negative for cough, shortness of breath and wheezing.   Cardiovascular:  Negative for chest pain, palpitations and orthopnea.  Gastrointestinal:  Negative for heartburn, nausea and vomiting.  Genitourinary:  Negative for dysuria, frequency and urgency.  Musculoskeletal:  Positive for joint pain.  Neurological:  Negative for dizziness, weakness and headaches.     Objective:  Physical Exam: Well nourished and well developed.   General: Alert and oriented x3, cooperative and pleasant, no acute distress.  Head: normocephalic, atraumatic, neck supple.  Eyes: EOMI.   Musculoskeletal: Right hip shows flexion to 90 with no internal or external rotation, only about 10 degrees of abduction.  Calves soft and nontender. Motor function intact in LE. Strength 5/5 LE bilaterally. Neuro: Distal pulses 2+. Sensation to light touch intact in LE.  Imaging Review Plain radiographs demonstrate severe degenerative joint disease of the right hip. The bone quality appears to be adequate for age and reported activity level.  Assessment/Plan:  End stage arthritis, right hip  The patient history, physical examination, clinical judgement of the provider and imaging studies are consistent with end stage degenerative joint disease of the right hip and total hip arthroplasty is deemed medically necessary. The  treatment options including medical management, injection therapy, arthroscopy and arthroplasty were discussed at length. The risks and benefits of total hip arthroplasty were presented and reviewed. The risks due to aseptic loosening, infection, stiffness, dislocation/subluxation, thromboembolic complications and other imponderables were discussed. The patient acknowledged the explanation, agreed to proceed with the plan and consent was signed. Patient is being admitted for inpatient treatment for surgery, pain control, PT, OT, prophylactic antibiotics, VTE prophylaxis, progressive ambulation  and ADLs and discharge planning.The patient is planning to be discharged  home .   Patient's anticipated LOS is less than 2 midnights, meeting these requirements: - Lives within 1 hour of care - Has a competent adult at home to recover with post-op recover - NO history of  - Chronic pain requiring opiods  - Heart failure  - Heart attack  - Stroke  - DVT/VTE  - Cardiac arrhythmia  - Respiratory Failure/COPD  - Renal failure  - Anemia  - Advanced Liver disease  Therapy Plans: HEP Disposition: Home with wife Planned DVT Prophylaxis: Plavix + 325 mg ASA QD DME Needed: Dan Humphreys PCP: Farris Has, MD (clearance received) Cardiologist: Verne Carrow, MD (clearance received) Endocrinologist: Talmage Coin, MD  TXA: IV Allergies: Muscle relaxers Anesthesia Concerns: None BMI: 36.9 Last HgbA1c: 8.3% in 09/2023 Pain Regimen: Hydrocodone Pharmacy: [facility] (have brought to room)  Other: - Hx CAD, DM - Aware that if this is 7.8% or higher we will need to postpone surgery - * does not want muscle relaxers, could not function for two days after taking with previous shoulder surgery  - Patient was instructed on what medications to stop prior to surgery. - Follow-up visit in 2 weeks with Dr. Lequita Halt - Begin physical therapy following surgery - Pre-operative lab work as pre-surgical testing -  Prescriptions will be provided in hospital at time of discharge  Arther Abbott, PA-C Orthopedic Surgery [facility] [place]

## 2023-12-09 NOTE — Progress Notes (Addendum)
 PCP - Farris Has, MD Cardiologist - Verne Carrow, MD  Tele appt 09-21-23 Bernadene Person, NP  LOV 07-23-23 epic  PPM/ICD -  Device Orders -  Rep Notified -   Chest x-ray -  EKG - 07-22-24  epic Stress Test -  ECHO - 10-17-21 epic Cardiac Cath -2017 epic   Sleep Study -  CPAP -   Fasting Blood Sugar -  Checks Blood Sugar _____ times a day  Blood Thinner Instructions: Aspirin Instructions:  ERAS Protcol - PRE-SURGERY G2-   COVID TEST-  COVID vaccine -  Activity-- Anesthesia review:   Patient denies shortness of breath, fever, cough and chest pain at PAT appointment   All instructions explained to the patient, with a verbal understanding of the material. Patient agrees to go over the instructions while at home for a better understanding. Patient also instructed to self quarantine after being tested for COVID-19. The opportunity to ask questions was provided.

## 2023-12-09 NOTE — Patient Instructions (Addendum)
 SURGICAL WAITING ROOM VISITATION  Patients having surgery or a procedure may have no more than 2 support people in the waiting area - these visitors may rotate.    Children under the age of 56 must have an adult with them who is not the patient.  Due to an increase in RSV and influenza rates and associated hospitalizations, children ages 4 and under may not visit patients in [facility] hospitals.  Visitors with respiratory illnesses are discouraged from visiting and should remain at home.  If the patient needs to stay at the hospital during part of their recovery, the visitor guidelines for inpatient rooms apply. Pre-op nurse will coordinate an appropriate time for 1 support person to accompany patient in pre-op.  This support person may not rotate.    Please refer to the [facility] website for the visitor guidelines for Inpatients (after your surgery is over and you are in a regular room).       Your procedure is scheduled on: 12-23-23   Report to [facility] Main Entrance    Report to admitting at     0830  AM   Call this number if you have problems the morning of surgery [phone]   Do not eat food :After Midnight.   After Midnight you may have the following liquids until _1100_____ AM/  DAY OF SURGERY  then nothing by mouth  Water Non-Citrus Juices (without pulp, NO RED-Apple, White grape, White cranberry) Black Coffee (NO MILK/CREAM OR CREAMERS, sugar ok)  Clear Tea (NO MILK/CREAM OR CREAMERS, sugar ok) regular and decaf                             Plain Jell-O (NO RED)                                           Fruit ices (not with fruit pulp, NO RED)                                     Popsicles (NO RED)                                                               Sports drinks like Gatorade (NO RED)                    The day of surgery:  Drink ONE (1) Pre-Surgery G2 by 0800  AM the morning of surgery. Drink in one sitting. Do not sip.  This drink was  given to you during your hospital  pre-op appointment visit. Nothing else to drink after completing the  Pre-Surgery G2.          If you have questions, please contact your surgeon's office.   FOLLOW ANY ADDITIONAL PRE OP INSTRUCTIONS YOU RECEIVED FROM YOUR SURGEON'S OFFICE!!!     Oral Hygiene is also important to reduce your risk of infection.  Remember - BRUSH YOUR TEETH THE MORNING OF SURGERY WITH YOUR REGULAR TOOTHPASTE  DENTURES WILL BE REMOVED PRIOR TO SURGERY PLEASE DO NOT APPLY "Poly grip" OR ADHESIVES!!!   Do NOT smoke after Midnight   Stop all vitamins and herbal supplements 7 days before surgery.   Take these medicines the morning of surgery with A SIP OF WATER: Rosuvastatin, metoprolol, zetia, Colesevalam(welchol), amlodipine  DO NOT TAKE ANY ORAL DIABETIC MEDICATIONS DAY OF YOUR SURGERY  Bring CPAP mask and tubing day of surgery.                              You may not have any metal on your body including hair pins, jewelry, and body piercing             Do not wear  lotions, powders, perfumes/cologne, or deodorant               Men may shave face and neck.   Do not bring valuables to the hospital. [facility] IS NOT             RESPONSIBLE   FOR VALUABLES.   Contacts, glasses, dentures or bridgework may not be worn into surgery.   Bring small overnight bag day of surgery.   DO NOT BRING YOUR HOME MEDICATIONS TO THE HOSPITAL. PHARMACY WILL DISPENSE MEDICATIONS LISTED ON YOUR MEDICATION LIST TO YOU DURING YOUR ADMISSION IN THE HOSPITAL!    Patients discharged on the day of surgery will not be allowed to drive home.  Someone NEEDS to stay with you for the first 24 hours after anesthesia.   Special Instructions: Bring a copy of your healthcare power of attorney and living will documents the day of surgery if you haven't scanned them before.              Please read over the following fact sheets you were given: IF YOU HAVE  QUESTIONS ABOUT YOUR PRE-OP INSTRUCTIONS PLEASE CALL [phone]    If you test positive for Covid or have been in contact with anyone that has tested positive in the last 10 days please notify you surgeon.      Pre-operative 5 CHG Bath Instructions   You can play a key role in reducing the risk of infection after surgery. Your skin needs to be as free of germs as possible. You can reduce the number of germs on your skin by washing with CHG (chlorhexidine gluconate) soap before surgery. CHG is an antiseptic soap that kills germs and continues to kill germs even after washing.   DO NOT use if you have an allergy to chlorhexidine/CHG or antibacterial soaps. If your skin becomes reddened or irritated, stop using the CHG and notify one of our RNs at [phone].   Please shower with the CHG soap starting 4 days before surgery using the following schedule:     Please keep in mind the following:  DO NOT shave, including legs and underarms, starting the day of your first shower.   You may shave your face at any point before/day of surgery.  Place clean sheets on your bed the day you start using CHG soap. Use a clean washcloth (not used since being washed) for each shower. DO NOT sleep with pets once you start using the CHG.   CHG Shower Instructions:  If you choose to wash your hair and private area, wash first with your normal shampoo/soap.  After you use  shampoo/soap, rinse your hair and body thoroughly to remove shampoo/soap residue.  Turn the water OFF and apply about 3 tablespoons (45 ml) of CHG soap to a CLEAN washcloth.  Apply CHG soap ONLY FROM YOUR NECK DOWN TO YOUR TOES (washing for 3-5 minutes)  DO NOT use CHG soap on face, private areas, open wounds, or sores.  Pay special attention to the area where your surgery is being performed.  If you are having back surgery, having someone wash your back for you may be helpful. Wait 2 minutes after CHG soap is applied, then you may rinse  off the CHG soap.  Pat dry with a clean towel  Put on clean clothes/pajamas   If you choose to wear lotion, please use ONLY the CHG-compatible lotions on the back of this paper.     Additional instructions for the day of surgery: DO NOT APPLY any lotions, deodorants, cologne, or perfumes.   Put on clean/comfortable clothes.  Brush your teeth.  Ask your nurse before applying any prescription medications to the skin.      CHG Compatible Lotions   Aveeno Moisturizing lotion  Cetaphil Moisturizing Cream  Cetaphil Moisturizing Lotion  Clairol Herbal Essence Moisturizing Lotion, Dry Skin  Clairol Herbal Essence Moisturizing Lotion, Extra Dry Skin  Clairol Herbal Essence Moisturizing Lotion, Normal Skin  Curel Age Defying Therapeutic Moisturizing Lotion with Alpha Hydroxy  Curel Extreme Care Body Lotion  Curel Soothing Hands Moisturizing Hand Lotion  Curel Therapeutic Moisturizing Cream, Fragrance-Free  Curel Therapeutic Moisturizing Lotion, Fragrance-Free  Curel Therapeutic Moisturizing Lotion, Original Formula  Eucerin Daily Replenishing Lotion  Eucerin Dry Skin Therapy Plus Alpha Hydroxy Crme  Eucerin Dry Skin Therapy Plus Alpha Hydroxy Lotion  Eucerin Original Crme  Eucerin Original Lotion  Eucerin Plus Crme Eucerin Plus Lotion  Eucerin TriLipid Replenishing Lotion  Keri Anti-Bacterial Hand Lotion  Keri Deep Conditioning Original Lotion Dry Skin Formula Softly Scented  Keri Deep Conditioning Original Lotion, Fragrance Free Sensitive Skin Formula  Keri Lotion Fast Absorbing Fragrance Free Sensitive Skin Formula  Keri Lotion Fast Absorbing Softly Scented Dry Skin Formula  Keri Original Lotion  Keri Skin Renewal Lotion Keri Silky Smooth Lotion  Keri Silky Smooth Sensitive Skin Lotion  Nivea Body Creamy Conditioning Oil  Nivea Body Extra Enriched Teacher, adult education Moisturizing Lotion Nivea Crme  Nivea Skin Firming Lotion  NutraDerm 30  Skin Lotion  NutraDerm Skin Lotion  NutraDerm Therapeutic Skin Cream  NutraDerm Therapeutic Skin Lotion  ProShield Protective Hand Cream  Provon moisturizing lotion  WHAT IS A BLOOD TRANSFUSION? Blood Transfusion Information  A transfusion is the replacement of blood or some of its parts. Blood is made up of multiple cells which provide different functions. Red blood cells carry oxygen and are used for blood loss replacement. White blood cells fight against infection. Platelets control bleeding. Plasma helps clot blood. Other blood products are available for specialized needs, such as hemophilia or other clotting disorders. BEFORE THE TRANSFUSION  Who gives blood for transfusions?  Healthy volunteers who are fully evaluated to make sure their blood is safe. This is blood bank blood. Transfusion therapy is the safest it has ever been in the practice of medicine. Before blood is taken from a donor, a complete history is taken to make sure that person has no history of diseases nor engages in risky social behavior (examples are intravenous drug use or sexual activity with multiple partners). The donor's travel history is screened to minimize  risk of transmitting infections, such as malaria. The donated blood is tested for signs of infectious diseases, such as HIV and hepatitis. The blood is then tested to be sure it is compatible with you in order to minimize the chance of a transfusion reaction. If you or a relative donates blood, this is often done in anticipation of surgery and is not appropriate for emergency situations. It takes many days to process the donated blood. RISKS AND COMPLICATIONS Although transfusion therapy is very safe and saves many lives, the main dangers of transfusion include:  Getting an infectious disease. Developing a transfusion reaction. This is an allergic reaction to something in the blood you were given. Every precaution is taken to prevent this. The decision to have  a blood transfusion has been considered carefully by your caregiver before blood is given. Blood is not given unless the benefits outweigh the risks. AFTER THE TRANSFUSION Right after receiving a blood transfusion, you will usually feel much better and more energetic. This is especially true if your red blood cells have gotten low (anemic). The transfusion raises the level of the red blood cells which carry oxygen, and this usually causes an energy increase. The nurse administering the transfusion will monitor you carefully for complications. HOME CARE INSTRUCTIONS  No special instructions are needed after a transfusion. You may find your energy is better. Speak with your caregiver about any limitations on activity for underlying diseases you may have. SEEK MEDICAL CARE IF:  Your condition is not improving after your transfusion. You develop redness or irritation at the intravenous (IV) site. SEEK IMMEDIATE MEDICAL CARE IF:  Any of the following symptoms occur over the next 12 hours: Shaking chills. You have a temperature by mouth above 102 F (38.9 C), not controlled by medicine. Chest, back, or muscle pain. People around you feel you are not acting correctly or are confused. Shortness of breath or difficulty breathing. Dizziness and fainting. You get a rash or develop hives. You have a decrease in urine output. Your urine turns a dark color or changes to pink, red, or brown. Any of the following symptoms occur over the next 10 days: You have a temperature by mouth above 102 F (38.9 C), not controlled by medicine. Shortness of breath. Weakness after normal activity. The white part of the eye turns yellow (jaundice). You have a decrease in the amount of urine or are urinating less often. Your urine turns a dark color or changes to pink, red, or brown. Document Released: 09/19/2000 Document Revised: 12/15/2011 Document Reviewed: 05/08/2008 [employer] Patient Information 2014 [place],  [place].  _______________________________________________________________________  Incentive Spirometer  An incentive spirometer is a tool that can help keep your lungs clear and active. This tool measures how well you are filling your lungs with each breath. Taking long deep breaths may help reverse or decrease the chance of developing breathing (pulmonary) problems (especially infection) following: A long period of time when you are unable to move or be active. BEFORE THE PROCEDURE  If the spirometer includes an indicator to show your best effort, your nurse or respiratory therapist will set it to a desired goal. If possible, sit up straight or lean slightly forward. Try not to slouch. Hold the incentive spirometer in an upright position. INSTRUCTIONS FOR USE  Sit on the edge of your bed if possible, or sit up as far as you can in bed or on a chair. Hold the incentive spirometer in an upright position. Breathe out normally. Place the mouthpiece  in your mouth and seal your lips tightly around it. Breathe in slowly and as deeply as possible, raising the piston or the ball toward the top of the column. Hold your breath for 3-5 seconds or for as long as possible. Allow the piston or ball to fall to the bottom of the column. Remove the mouthpiece from your mouth and breathe out normally. Rest for a few seconds and repeat Steps 1 through 7 at least 10 times every 1-2 hours when you are awake. Take your time and take a few normal breaths between deep breaths. The spirometer may include an indicator to show your best effort. Use the indicator as a goal to work toward during each repetition. After each set of 10 deep breaths, practice coughing to be sure your lungs are clear. If you have an incision (the cut made at the time of surgery), support your incision when coughing by placing a pillow or rolled up towels firmly against it. Once you are able to get out of bed, walk around indoors and cough well.  You may stop using the incentive spirometer when instructed by your caregiver.  RISKS AND COMPLICATIONS Take your time so you do not get dizzy or light-headed. If you are in pain, you may need to take or ask for pain medication before doing incentive spirometry. It is harder to take a deep breath if you are having pain. AFTER USE Rest and breathe slowly and easily. It can be helpful to keep track of a log of your progress. Your caregiver can provide you with a simple table to help with this. If you are using the spirometer at home, follow these instructions: SEEK MEDICAL CARE IF:  You are having difficultly using the spirometer. You have trouble using the spirometer as often as instructed. Your pain medication is not giving enough relief while using the spirometer. You develop fever of 100.5 F (38.1 C) or higher. SEEK IMMEDIATE MEDICAL CARE IF:  You cough up bloody sputum that had not been present before. You develop fever of 102 F (38.9 C) or greater. You develop worsening pain at or near the incision site. MAKE SURE YOU:  Understand these instructions. Will watch your condition. Will get help right away if you are not doing well or get worse. Document Released: 02/02/2007 Document Revised: 12/15/2011 Document Reviewed: 04/05/2007 [facility] Patient Information 2014 [place], [place].   ________________________________________________________________________

## 2023-12-10 ENCOUNTER — Encounter (HOSPITAL_COMMUNITY)
Admission: RE | Admit: 2023-12-10 | Discharge: 2023-12-10 | Disposition: A | Discharge status: Home / Self Care | Payer: Federal, State, Local not specified - PPO | Source: Ambulatory Visit | Attending: Orthopedic Surgery | Admitting: Orthopedic Surgery

## 2023-12-10 DIAGNOSIS — Z01818 Encounter for other preprocedural examination: Secondary | ICD-10-CM

## 2023-12-10 DIAGNOSIS — I152 Hypertension secondary to endocrine disorders: Secondary | ICD-10-CM

## 2023-12-10 DIAGNOSIS — E1159 Type 2 diabetes mellitus with other circulatory complications: Secondary | ICD-10-CM

## 2023-12-10 NOTE — Progress Notes (Signed)
 LVM  with return call # pt. Has not shown up for preop. No answer on home or cell#

## 2023-12-10 NOTE — Progress Notes (Signed)
 Spoke with Dr. Lequita Halt  scheduler regarding pt. Not coming to preop and if had had a clearance he was scheduled for? Donata Duff is following up.

## 2023-12-16 ENCOUNTER — Encounter (HOSPITAL_COMMUNITY)

## 2023-12-17 ENCOUNTER — Other Ambulatory Visit (HOSPITAL_COMMUNITY): Payer: Self-pay

## 2023-12-21 NOTE — Patient Instructions (Addendum)
 SURGICAL WAITING ROOM VISITATION  Patients having surgery or a procedure may have no more than 2 support people in the waiting area - these visitors may rotate.    Children under the age of 16 must have an adult with them who is not the patient.  Due to an increase in RSV and influenza rates and associated hospitalizations, children ages 62 and under may not visit patients in [facility] hospitals.  Visitors with respiratory illnesses are discouraged from visiting and should remain at home.  If the patient needs to stay at the hospital during part of their recovery, the visitor guidelines for inpatient rooms apply. Pre-op nurse will coordinate an appropriate time for 1 support person to accompany patient in pre-op.  This support person may not rotate.    Please refer to the [facility] website for the visitor guidelines for Inpatients (after your surgery is over and you are in a regular room).       Your procedure is scheduled on: 12/23/23   Report to [facility] Main Entrance    Report to admitting at 8:30 AM   Call this number if you have problems the morning of surgery [phone]   Do not eat food :After Midnight.   After Midnight you may have the following liquids until 8 AM DAY OF SURGERY  Water Non-Citrus Juices (without pulp, NO RED-Apple, White grape, White cranberry) Black Coffee (NO MILK/CREAM OR CREAMERS, sugar ok)  Clear Tea (NO MILK/CREAM OR CREAMERS, sugar ok) regular and decaf                             Plain Jell-O (NO RED)                                           Fruit ices (not with fruit pulp, NO RED)                                     Popsicles (NO RED)                                                               Sports drinks like Gatorade (NO RED)                  The day of surgery:  Drink ONE (1) Pre-Surgery G2 at 8 AM the morning of surgery. Drink in one sitting. Do not sip.  This drink was given to you during your hospital  pre-op  appointment visit. Nothing else to drink after completing the  Pre-Surgery  G2.       Oral Hygiene is also important to reduce your risk of infection.                                    Remember - BRUSH YOUR TEETH THE MORNING OF SURGERY WITH YOUR REGULAR TOOTHPASTE  DENTURES WILL BE REMOVED PRIOR TO SURGERY PLEASE DO NOT APPLY "Poly grip" OR ADHESIVES!!!   Stop  all vitamins and herbal supplements 7 days before surgery.   Take these medicines the morning of surgery with A SIP OF WATER: Amlodipine, Welchol(Colesevelam), Ezetimibe(Zetia),Metoprolol, Rosuvastatin  DO NOT TAKE ANY ORAL DIABETIC MEDICATIONS DAY OF YOUR SURGERY  Bring CPAP mask and tubing day of surgery.                              You may not have any metal on your body including hair pins, jewelry, and body piercing             Do not wear make-up, lotions, powders, perfumes/cologne, or deodorant              Men may shave face and neck.   Do not bring valuables to the hospital. [facility] IS NOT             RESPONSIBLE   FOR VALUABLES.   Contacts, glasses, dentures or bridgework may not be worn into surgery.   Bring small overnight bag day of surgery.   DO NOT BRING YOUR HOME MEDICATIONS TO THE HOSPITAL. PHARMACY WILL DISPENSE MEDICATIONS LISTED ON YOUR MEDICATION LIST TO YOU DURING YOUR ADMISSION IN THE HOSPITAL!    Patients discharged on the day of surgery will not be allowed to drive home.  Someone NEEDS to stay with you for the first 24 hours after anesthesia.   Special Instructions: Bring a copy of your healthcare power of attorney and living will documents the day of surgery if you haven't scanned them before.              Please read over the following fact sheets you were given: IF YOU HAVE QUESTIONS ABOUT YOUR PRE-OP INSTRUCTIONS PLEASE CALL [phone]   If you received a COVID test during your pre-op visit  it is requested that you wear a mask when out in public, stay away from anyone that may not  be feeling well and notify your surgeon if you develop symptoms. If you test positive for Covid or have been in contact with anyone that has tested positive in the last 10 days please notify you surgeon.      Pre-operative 5 CHG Bath Instructions   You can play a key role in reducing the risk of infection after surgery. Your skin needs to be as free of germs as possible. You can reduce the number of germs on your skin by washing with CHG (chlorhexidine gluconate) soap before surgery. CHG is an antiseptic soap that kills germs and continues to kill germs even after washing.   DO NOT use if you have an allergy to chlorhexidine/CHG or antibacterial soaps. If your skin becomes reddened or irritated, stop using the CHG and notify one of our RNs at [phone].   Please shower with the CHG soap starting 4 days before surgery using the following schedule:     Please keep in mind the following:  DO NOT shave, including legs and underarms, starting the day of your first shower.   You may shave your face at any point before/day of surgery.  Place clean sheets on your bed the day you start using CHG soap. Use a clean washcloth (not used since being washed) for each shower. DO NOT sleep with pets once you start using the CHG.   CHG Shower Instructions:  If you choose to wash your hair and private area, wash first with your normal shampoo/soap.  After you use shampoo/soap,  rinse your hair and body thoroughly to remove shampoo/soap residue.  Turn the water OFF and apply about 3 tablespoons (45 ml) of CHG soap to a CLEAN washcloth.  Apply CHG soap ONLY FROM YOUR NECK DOWN TO YOUR TOES (washing for 3-5 minutes)  DO NOT use CHG soap on face, private areas, open wounds, or sores.  Pay special attention to the area where your surgery is being performed.  If you are having back surgery, having someone wash your back for you may be helpful. Wait 2 minutes after CHG soap is applied, then you may rinse off  the CHG soap.  Pat dry with a clean towel  Put on clean clothes/pajamas   If you choose to wear lotion, please use ONLY the CHG-compatible lotions on the back of this paper.     Additional instructions for the day of surgery: DO NOT APPLY any lotions, deodorants, cologne, or perfumes.   Put on clean/comfortable clothes.  Brush your teeth.  Ask your nurse before applying any prescription medications to the skin.      CHG Compatible Lotions   Aveeno Moisturizing lotion  Cetaphil Moisturizing Cream  Cetaphil Moisturizing Lotion  Clairol Herbal Essence Moisturizing Lotion, Dry Skin  Clairol Herbal Essence Moisturizing Lotion, Extra Dry Skin  Clairol Herbal Essence Moisturizing Lotion, Normal Skin  Curel Age Defying Therapeutic Moisturizing Lotion with Alpha Hydroxy  Curel Extreme Care Body Lotion  Curel Soothing Hands Moisturizing Hand Lotion  Curel Therapeutic Moisturizing Cream, Fragrance-Free  Curel Therapeutic Moisturizing Lotion, Fragrance-Free  Curel Therapeutic Moisturizing Lotion, Original Formula  Eucerin Daily Replenishing Lotion  Eucerin Dry Skin Therapy Plus Alpha Hydroxy Crme  Eucerin Dry Skin Therapy Plus Alpha Hydroxy Lotion  Eucerin Original Crme  Eucerin Original Lotion  Eucerin Plus Crme Eucerin Plus Lotion  Eucerin TriLipid Replenishing Lotion  Keri Anti-Bacterial Hand Lotion  Keri Deep Conditioning Original Lotion Dry Skin Formula Softly Scented  Keri Deep Conditioning Original Lotion, Fragrance Free Sensitive Skin Formula  Keri Lotion Fast Absorbing Fragrance Free Sensitive Skin Formula  Keri Lotion Fast Absorbing Softly Scented Dry Skin Formula  Keri Original Lotion  Keri Skin Renewal Lotion Keri Silky Smooth Lotion  Keri Silky Smooth Sensitive Skin Lotion  Nivea Body Creamy Conditioning Oil  Nivea Body Extra Enriched Lotion  Nivea Body Original Lotion  Nivea Body Sheer Moisturizing Lotion Nivea Crme  Nivea Skin Firming Lotion  NutraDerm 30 Skin  Lotion  NutraDerm Skin Lotion  NutraDerm Therapeutic Skin Cream  NutraDerm Therapeutic Skin Lotion  ProShield Protective Hand Cream   Incentive Spirometer (Watch this video at home: [email or web address])  An incentive spirometer is a tool that can help keep your lungs clear and active. This tool measures how well you are filling your lungs with each breath. Taking long deep breaths may help reverse or decrease the chance of developing breathing (pulmonary) problems (especially infection) following: A long period of time when you are unable to move or be active. BEFORE THE PROCEDURE  If the spirometer includes an indicator to show your best effort, your nurse or respiratory therapist will set it to a desired goal. If possible, sit up straight or lean slightly forward. Try not to slouch. Hold the incentive spirometer in an upright position. INSTRUCTIONS FOR USE  Sit on the edge of your bed if possible, or sit up as far as you can in bed or on a chair. Hold the incentive spirometer in an upright position. Breathe out normally. Place the mouthpiece in your mouth and seal your  lips tightly around it. Breathe in slowly and as deeply as possible, raising the piston or the ball toward the top of the column. Hold your breath for 3-5 seconds or for as long as possible. Allow the piston or ball to fall to the bottom of the column. Remove the mouthpiece from your mouth and breathe out normally. Rest for a few seconds and repeat Steps 1 through 7 at least 10 times every 1-2 hours when you are awake. Take your time and take a few normal breaths between deep breaths. The spirometer may include an indicator to show your best effort. Use the indicator as a goal to work toward during each repetition. After each set of 10 deep breaths, practice coughing to be sure your lungs are clear. If you have an incision (the cut made at the time of surgery), support your incision when coughing by  placing a pillow or rolled up towels firmly against it. Once you are able to get out of bed, walk around indoors and cough well. You may stop using the incentive spirometer when instructed by your caregiver.  RISKS AND COMPLICATIONS Take your time so you do not get dizzy or light-headed. If you are in pain, you may need to take or ask for pain medication before doing incentive spirometry. It is harder to take a deep breath if you are having pain. AFTER USE Rest and breathe slowly and easily. It can be helpful to keep track of a log of your progress. Your caregiver can provide you with a simple table to help with this. If you are using the spirometer at home, follow these instructions: SEEK MEDICAL CARE IF:  You are having difficultly using the spirometer. You have trouble using the spirometer as often as instructed. Your pain medication is not giving enough relief while using the spirometer. You develop fever of 100.5 F (38.1 C) or higher. SEEK IMMEDIATE MEDICAL CARE IF:  You cough up bloody sputum that had not been present before. You develop fever of 102 F (38.9 C) or greater. You develop worsening pain at or near the incision site. MAKE SURE YOU:  Understand these instructions. Will watch your condition. Will get help right away if you are not doing well or get worse. Document Released: 02/02/2007 Document Revised: 12/15/2011 Document Reviewed: 04/05/2007 [facility] Patient Information 2014 [place], [place].How to Manage Your Diabetes Before and After Surgery  Why is it important to control my blood sugar before and after surgery? Improving blood sugar levels before and after surgery helps healing and can limit problems. A way of improving blood sugar control is eating a healthy diet by:  Eating less sugar and carbohydrates  Increasing activity/exercise  Talking with your doctor about reaching your blood sugar goals High blood sugars (greater than 180 mg/dL) can raise your risk of  infections and slow your recovery, so you will need to focus on controlling your diabetes during the weeks before surgery. Make sure that the doctor who takes care of your diabetes knows about your planned surgery including the date and location.  How do I manage my blood sugar before surgery? Check your blood sugar at least 4 times a day, starting 2 days before surgery, to make sure that the level is not too high or low. Check your blood sugar the morning of your surgery when you wake up and every 2 hours until you get to the Short Stay unit. If your blood sugar is less than 70 mg/dL, you will need to  treat for low blood sugar: Do not take insulin. Treat a low blood sugar (less than 70 mg/dL) with  cup of clear juice (cranberry or apple), 4 glucose tablets, OR glucose gel. Recheck blood sugar in 15 minutes after treatment (to make sure it is greater than 70 mg/dL). If your blood sugar is not greater than 70 mg/dL on recheck, call [phone] for further instructions. Report your blood sugar to the short stay nurse when you get to Short Stay.  If you are admitted to the hospital after surgery: Your blood sugar will be checked by the staff and you will probably be given insulin after surgery (instead of oral diabetes medicines) to make sure you have good blood sugar levels. The goal for blood sugar control after surgery is 80-180 mg/dL.   WHAT DO I DO ABOUT MY DIABETES MEDICATION?  Do not take oral diabetes medicines (pills) the morning of surgery. Hold Jardiance for 72 hours before surgery. Last dose to be   THE NIGHT BEFORE SURGERY,at dinner take   70% of insulin dose ____ units       THE MORNING OF SURGERY,Do not take any insulin.  DO NOT TAKE THE FOLLOWING 7 DAYS PRIOR TO SURGERY: Ozempic, Wegovy, Rybelsus (Semaglutide), Byetta (exenatide), Bydureon (exenatide ER), Victoza, Saxenda (liraglutide), or Trulicity (dulaglutide) Mounjaro (Tirzepatide) Adlyxin (Lixisenatide), Polyethylene  Glycol Loxenatide.  Patient Signature:  Date:   Nurse Signature:  Date:   Reviewed and Endorsed by [facility] Patient Education Committee, August 2015

## 2023-12-21 NOTE — Progress Notes (Signed)
 COVID Vaccine received:  []  No [x]  Yes Date of any COVID positive Test in last 90 days: no PCP - Farris Has MD Cardiologist - Earney Hamburg MD  Chest x-ray -  EKG -  07/23/23 Epic Stress Test -  ECHO - 10/17/21 Epic Cardiac Cath - 05/07/16 Epic  Bowel Prep - [x]  No  []   Yes ______  Pacemaker / ICD device [x]  No []  Yes   Spinal Cord Stimulator:[x]  No []  Yes       History of Sleep Apnea? [x]  No []  Yes   CPAP used?- [x]  No []  Yes    Does the patient monitor blood sugar?          []  No [x]  Yes  []  N/A  Patient has: []  NO Hx DM   []  Pre-DM                 []  DM1  [x]   DM2 Does patient have a [employer] or Dexacom? []  No [x]  Yes   Fasting Blood Sugar Ranges- 160-230 Checks Blood Sugar ___3__ times a day  GLP1 agonist / usual dose - Ozempic Last took 12/20/23 Last took Jardiance 12/22/23 SGLT-2 inhibitors / usual dose - no SGLT-2 instructions:   Blood Thinner / Instructions:no Aspirin Instructions:ASA 81mg  last dose 12/22/23  Comments:   Activity level: Patient is able to climb a flight of stairs without difficulty; [x]  No CP  [x]  No SOB,  Patient can / perform ADLs without assistance.   Anesthesia review:   Patient denies shortness of breath, fever, cough and chest pain at PAT appointment.  Patient verbalized understanding and agreement to the Pre-Surgical Instructions that were given to them at this PAT appointment. Patient was also educated of the need to review these PAT instructions again prior to his/her surgery.I reviewed the appropriate phone numbers to call if they have any and questions or concerns.

## 2023-12-22 ENCOUNTER — Encounter (HOSPITAL_COMMUNITY): Payer: Self-pay

## 2023-12-22 ENCOUNTER — Other Ambulatory Visit: Payer: Self-pay

## 2023-12-22 ENCOUNTER — Encounter (HOSPITAL_COMMUNITY): Payer: Self-pay | Admitting: Physician Assistant

## 2023-12-22 ENCOUNTER — Encounter (HOSPITAL_COMMUNITY)
Admission: RE | Admit: 2023-12-22 | Discharge: 2023-12-22 | Disposition: A | Discharge status: Home / Self Care | Source: Ambulatory Visit | Attending: Orthopedic Surgery | Admitting: Orthopedic Surgery

## 2023-12-22 DIAGNOSIS — Z794 Long term (current) use of insulin: Secondary | ICD-10-CM | POA: Insufficient documentation

## 2023-12-22 DIAGNOSIS — G4733 Obstructive sleep apnea (adult) (pediatric): Secondary | ICD-10-CM | POA: Diagnosis not present

## 2023-12-22 DIAGNOSIS — I10 Essential (primary) hypertension: Secondary | ICD-10-CM | POA: Insufficient documentation

## 2023-12-22 DIAGNOSIS — Z955 Presence of coronary angioplasty implant and graft: Secondary | ICD-10-CM | POA: Insufficient documentation

## 2023-12-22 DIAGNOSIS — Z01812 Encounter for preprocedural laboratory examination: Secondary | ICD-10-CM | POA: Insufficient documentation

## 2023-12-22 DIAGNOSIS — Z7985 Long-term (current) use of injectable non-insulin antidiabetic drugs: Secondary | ICD-10-CM | POA: Insufficient documentation

## 2023-12-22 DIAGNOSIS — Z7984 Long term (current) use of oral hypoglycemic drugs: Secondary | ICD-10-CM | POA: Insufficient documentation

## 2023-12-22 DIAGNOSIS — E119 Type 2 diabetes mellitus without complications: Secondary | ICD-10-CM | POA: Insufficient documentation

## 2023-12-22 DIAGNOSIS — M1611 Unilateral primary osteoarthritis, right hip: Secondary | ICD-10-CM | POA: Insufficient documentation

## 2023-12-22 DIAGNOSIS — Z01818 Encounter for other preprocedural examination: Secondary | ICD-10-CM

## 2023-12-22 LAB — BASIC METABOLIC PANEL
Anion gap: 8 (ref 5–15)
BUN: 17 mg/dL (ref 8–23)
CO2: 25 mmol/L (ref 22–32)
Calcium: 9.5 mg/dL (ref 8.9–10.3)
Chloride: 102 mmol/L (ref 98–111)
Creatinine, Ser: 1.03 mg/dL (ref 0.61–1.24)
GFR, Estimated: >60 mL/min (ref >60)
Glucose, Bld: 320 mg/dL — ABNORMAL HIGH (ref 70–99)
Potassium: 3.9 mmol/L (ref 3.5–5.1)
Sodium: 135 mmol/L (ref 135–145)

## 2023-12-22 LAB — GLUCOSE, CAPILLARY: Glucose-Capillary: 368 mg/dL — ABNORMAL HIGH (ref 70–99)

## 2023-12-22 LAB — CBC
HCT: 49.2 % (ref 39.0–52.0)
Hemoglobin: 14.9 g/dL (ref 13.0–17.0)
MCH: 29.9 pg (ref 26.0–34.0)
MCHC: 30.3 g/dL (ref 30.0–36.0)
MCV: 98.6 fL (ref 80.0–100.0)
Platelets: 184 10*3/uL (ref 150–400)
RBC: 4.99 MIL/uL (ref 4.22–5.81)
RDW: 12.8 % (ref 11.5–15.5)
WBC: 8.4 10*3/uL (ref 4.0–10.5)
nRBC: 0 % (ref 0.0–0.2)

## 2023-12-22 LAB — SURGICAL PCR SCREEN
MRSA, PCR: NEGATIVE
Staphylococcus aureus: NEGATIVE

## 2023-12-22 LAB — HEMOGLOBIN A1C
Hgb A1c MFr Bld: 8.7 % — ABNORMAL HIGH (ref 4.8–5.6)
Mean Plasma Glucose: 202.99 mg/dL

## 2023-12-22 NOTE — Progress Notes (Signed)
 Anesthesia Chart Review   Case: [id number] Date/Time: 12/23/23 1045   Procedure: ARTHROPLASTY, HIP, TOTAL, ANTERIOR APPROACH (Right: Hip)   Anesthesia type: Choice   Pre-op diagnosis: right hip osteoarthritis   Location: [facility] ROOM 10 / [facility]   Surgeons: Ollen Gross, MD       DISCUSSION:67 y.o. never smoker with h/o HTN, OSA, DM II, CAD s/p DES-LAD in 2009, DES-LCx in 2017, right hip OA scheduled for above procedure 12/23/2023 with Dr. Ollen Gross.   Pt last seen by cardiology 07/23/2023. Per OV note pt reports occasional chest discomfort following sexual intercourse, no other exertional symptoms.  If sx progress ischemic evaluation to be considered.  4 month follow up recommended.  At this time preoperative evaluation was addressed, per notes, "He notes that he will likely have hip surgery in December 2024.  According to the Revised Cardiac Risk Index (RCRI), his Perioperative Risk of Major Cardiac Event is (%): 6.6. He is able to complete greater than 4 METS. Therefore, based on ACC/AHA guidelines, patient would be at acceptable risk for the planned procedure without further cardiovascular testing."  Pt reports to PAT nurse he can climb a flight without chest pain or shortness of breath.   A1C 8.7.   At the time of PAT visit on 12/22/2023 pt had not held [place] or [place].  Last dose of both 05/22/2024.   Discussed with Arther Abbott, PA.  Case will be postponed.  VS: BP (!) 178/90   Pulse 81   Temp 37 C (Oral)   Resp 18   Ht 5\' 10"  (1.778 m)   Wt 112.5 kg   SpO2 96%   BMI 35.58 kg/m   PROVIDERS: Farris Has, MD is PCP   Primary Cardiologist:  Verne Carrow, MD  LABS:  forwarded to PCP and Dr. Lequita Halt.  (all labs ordered are listed, but only abnormal results are displayed)  Labs Reviewed  HEMOGLOBIN A1C - Abnormal; Notable for the following components:      Result Value   Hgb A1c MFr Bld 8.7 (*)    All other components within normal limits  BASIC  METABOLIC PANEL - Abnormal; Notable for the following components:   Glucose, Bld 320 (*)    All other components within normal limits  GLUCOSE, CAPILLARY - Abnormal; Notable for the following components:   Glucose-Capillary 368 (*)    All other components within normal limits  SURGICAL PCR SCREEN  CBC  TYPE AND SCREEN     IMAGES:   EKG:   CV: Echo 10/17/2021 1. Left ventricular ejection fraction, by estimation, is 60 to 65%. The  left ventricle has normal function. The left ventricle has no regional  wall motion abnormalities. There is moderate left ventricular hypertrophy.  Left ventricular diastolic  parameters are consistent with Grade I diastolic dysfunction (impaired  relaxation). The average left ventricular global longitudinal strain is  -17.0 %. The global longitudinal strain is normal.   2. Right ventricular systolic function is normal. The right ventricular  size is normal.   3. The mitral valve is grossly normal. Trivial mitral valve  regurgitation.   4. The aortic valve is normal in structure. Aortic valve regurgitation is  not visualized.  Past Medical History:  Diagnosis Date   Arthritis    "shoulders, right hip" (05/07/2016)   Back pain    CAD S/P percutaneous coronary angioplasty    a. DES to LAD in 2009 b. cath: 05/2016 w/ patent stent to LAD, 99% stenosis Prox Cx (treated  w/ DES), and 99% stenosis of small caliber non-dominant RCA (no intervention performed).   Carpal tunnel syndrome    High cholesterol    Hip pain    History of heart attack    Hypertension    Hypertension associated with diabetes (HCC) 06/26/2010   Qualifier: Diagnosis of  By: Juanda Chance, MD, Johny Chess    Metabolic syndrome    OSA (obstructive sleep apnea)    "used mask; lost weight; stopped using mask" (05/07/2016)   Tinnitus    Type II diabetes mellitus (HCC)     Past Surgical History:  Procedure Laterality Date   CARDIAC CATHETERIZATION N/A 05/07/2016   Procedure: Left  Heart Cath and Coronary Angiography;  Surgeon: Kathleene Hazel, MD;  Location: [facility] INVASIVE CV LAB;  Service: Cardiovascular;  Laterality: N/A;   CARDIAC CATHETERIZATION N/A 05/07/2016   Procedure: Coronary Stent Intervention;  Surgeon: Kathleene Hazel, MD;  Location: [facility] INVASIVE CV LAB;  Service: Cardiovascular;  Laterality: N/A;   CORONARY ANGIOPLASTY WITH STENT PLACEMENT  02/2008; 05/07/2016   TONSILLECTOMY  ~ 1969   WRIST SURGERY      MEDICATIONS:  amLODipine (NORVASC) 5 MG tablet   aspirin EC 81 MG tablet   colesevelam (WELCHOL) 625 MG tablet   Continuous Blood Gluc Sensor (FREESTYLE LIBRE 14 DAY SENSOR) MISC   Continuous Glucose Receiver (FREESTYLE LIBRE 3 READER) DEVI   Continuous Glucose Receiver (FREESTYLE LIBRE 3 READER) DEVI   Continuous Glucose Sensor (FREESTYLE LIBRE 3 SENSOR) MISC   Dulaglutide (TRULICITY) 4.5 MG/0.5ML SOPN   empagliflozin (JARDIANCE) 25 MG TABS tablet   ezetimibe (ZETIA) 10 MG tablet   insulin aspart protamine - aspart (NOVOLOG MIX 70/30 FLEXPEN) (70-30) 100 UNIT/ML FlexPen   Insulin Pen Needle (TECHLITE PEN NEEDLES) 32G X 4 MM MISC   lisinopril (ZESTRIL) 40 MG tablet   metoprolol tartrate (LOPRESSOR) 50 MG tablet   nitroGLYCERIN (NITROSTAT) 0.4 MG SL tablet   rosuvastatin (CRESTOR) 40 MG tablet   Semaglutide, 1 MG/DOSE, (OZEMPIC, 1 MG/DOSE,) 4 MG/3ML SOPN   Semaglutide,0.25 or 0.5MG /DOS, (OZEMPIC, 0.25 OR 0.5 MG/DOSE,) 2 MG/3ML SOPN   sildenafil (VIAGRA) 100 MG tablet   Vitamin D, Ergocalciferol, (DRISDOL) 1.25 MG (50000 UNIT) CAPS capsule   No current facility-administered medications for this encounter.    Jodell Cipro Ward, PA-C [facility] Pre-Surgical Testing [phone]

## 2023-12-23 ENCOUNTER — Ambulatory Visit (HOSPITAL_COMMUNITY)
Admission: RE | Admit: 2023-12-23 | Payer: Federal, State, Local not specified - PPO | Source: Home / Self Care | Admitting: Orthopedic Surgery

## 2023-12-23 ENCOUNTER — Encounter (HOSPITAL_COMMUNITY): Admission: RE | Payer: Self-pay | Source: Home / Self Care

## 2023-12-23 DIAGNOSIS — M1611 Unilateral primary osteoarthritis, right hip: Secondary | ICD-10-CM

## 2023-12-23 DIAGNOSIS — Z794 Long term (current) use of insulin: Secondary | ICD-10-CM

## 2023-12-23 DIAGNOSIS — E119 Type 2 diabetes mellitus without complications: Secondary | ICD-10-CM

## 2023-12-23 DIAGNOSIS — Z01818 Encounter for other preprocedural examination: Secondary | ICD-10-CM

## 2023-12-23 LAB — TYPE AND SCREEN
ABO/RH(D): A POS
Antibody Screen: NEGATIVE

## 2023-12-23 SURGERY — ARTHROPLASTY, HIP, TOTAL, ANTERIOR APPROACH
Anesthesia: Choice | Site: Hip | Laterality: Right

## 2023-12-25 ENCOUNTER — Other Ambulatory Visit: Payer: Self-pay | Admitting: Cardiovascular Disease

## 2023-12-25 ENCOUNTER — Other Ambulatory Visit (HOSPITAL_COMMUNITY): Payer: Self-pay

## 2023-12-25 MED ORDER — EZETIMIBE 10 MG PO TABS
10.0000 mg | ORAL_TABLET | Freq: Every day | ORAL | 2 refills | Status: DC
  Filled 2023-12-25: qty 90, 90d supply, fill #0
  Filled 2024-03-28: qty 90, 90d supply, fill #1
  Filled 2024-06-25: qty 90, 90d supply, fill #2

## 2024-01-08 ENCOUNTER — Other Ambulatory Visit (HOSPITAL_COMMUNITY): Payer: Self-pay

## 2024-02-05 ENCOUNTER — Other Ambulatory Visit (HOSPITAL_COMMUNITY): Payer: Self-pay

## 2024-02-08 ENCOUNTER — Other Ambulatory Visit (HOSPITAL_COMMUNITY): Payer: Self-pay

## 2024-02-10 ENCOUNTER — Other Ambulatory Visit (HOSPITAL_COMMUNITY): Payer: Self-pay

## 2024-02-11 ENCOUNTER — Other Ambulatory Visit (HOSPITAL_COMMUNITY): Payer: Self-pay

## 2024-02-11 MED ORDER — INSULIN PEN NEEDLE 32G X 4 MM MISC
1.0000 | Freq: Three times a day (TID) | 1 refills | Status: AC
Start: 2024-02-11 — End: ?
  Filled 2024-02-11: qty 300, 90d supply, fill #0
  Filled 2024-02-11: qty 300, 100d supply, fill #0

## 2024-02-15 ENCOUNTER — Other Ambulatory Visit (HOSPITAL_COMMUNITY): Payer: Self-pay

## 2024-02-21 NOTE — Progress Notes (Signed)
 Cardiology Office Note    Date:  02/23/2024  ID:  Philip Black 11-10-1955, MRN [id number] PCP:  Philip Coho, MD  Cardiologist:  Philip Batman, MD  Electrophysiologist:  None   Chief Complaint: Follow up for CAD   History of Present Illness: .    Philip Black is a 68 y.o. male with visit-pertinent history of CAD s/p DES to LAD in 2009, DES to LCx in 2017, hypertension, hyperlipidemia, type 2 diabetes, OSA.  In 2009 he was hospitalized in setting of anterior MI, treated with DES to the LAD.  Cardiac authorization August 2017 in setting of unstable angina revealed severe stenosis of the proximal circumflex treated with a DES as well as severe stenosis in a small caliber nondominant RCA, managed medically.  LV function was normal by LV gram.  Patient has been intolerant to high-dose beta-blocker in the setting of bradycardia.  Patient was last seen in clinic on 07/23/2023, he reported he had overall been stable from a cardiac standpoint.  He noted occasional chest discomfort after sexual core intercourse, he denied any exertional symptoms, denies dyspnea.  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  Patient reported that overall he been feeling well.  Today he presents for follow-up.  He reports that he has been doing well. He denies any chest pain, tightness or recent pressure. He denies shortness of breath, lower extremity edema, orthopnea or pnd. He denies palpitations.  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  He is also regularly going to the gym and tolerating well.  Patient notes that he has been struggling with his hemoglobin A1c, he has had to cancel his procedure twice as his A1c remains above goal.  He plans to continue following up with his PCP regarding this.  Labwork independently reviewed: 12/22/2023: Sodium 135, potassium 3.9, creatinine 1.03 ROS: .   Today he denies chest pain, shortness of  breath, lower extremity edema, fatigue, palpitations, melena, hematuria, hemoptysis, diaphoresis, weakness, presyncope, syncope, orthopnea, and PND.  All other systems are reviewed and otherwise negative. Studies Reviewed: Philip Black   EKG:  EKG is ordered today, personally reviewed, demonstrating  EKG Interpretation Date/Time:  Tuesday Feb 23 2024 09:16:43 EDT Ventricular Rate:  87 PR Interval:  204 QRS Duration:  90 QT Interval:  392 QTC Calculation: 471 R Axis:   36  Text Interpretation: Normal sinus rhythm with sinus arrhythmia Normal ECG When compared with ECG of 23-Jul-2023 09:22, No significant change was found Confirmed by Philip Black [phone]) on 02/23/2024 9:32:54 AM   CV Studies: Cardiac studies reviewed are outlined and summarized above. Otherwise please see EMR for full report. Cardiac Studies & Procedures   ______________________________________________________________________________________________ CARDIAC CATHETERIZATION  CARDIAC CATHETERIZATION 05/07/2016  Conclusion  Mid RCA lesion, 99 %stenosed.  Prox RCA lesion, 90 %stenosed.  Ost 2nd Mrg to 2nd Mrg lesion, 80 %stenosed.  4th Mrg lesion, 50 %stenosed.  Ost 1st Mrg lesion, 50 %stenosed.  1st Mrg lesion, 50 %stenosed.  Ost LAD to Prox LAD lesion, 10 %stenosed.  A STENT PROMUS PREM MR 3.0X12 drug eluting stent was successfully placed.  Ost Cx to Prox Cx lesion, 99 %stenosed.  Post intervention, there is a 0% residual stenosis.  Prox LAD lesion, 30 %stenosed.  The left ventricular systolic function is normal.  LV end diastolic pressure is normal.  The left ventricular ejection fraction is 50-55% by visual estimate.  There is no mitral valve regurgitation.  1.  Triple vessel CAD 2. Patent stent proximal LAD with minimal restenosis 3. Severe stenosis ostial/proximal Circumflex 4. Successful PTCA/DES x 1 proximal Circumflex 5. Severe stenosis small caliber non-dominant RCA 6. Low normal LV systolic  function  Recommendations: Will start Brilinta  and ASA and Brilinta  for now. He will be enrolled in the TWILIGHT study. Continue statin.  Findings Coronary Findings Diagnostic  Dominance: Left  Left Anterior Descending The lesion was previously treated using a drug eluting stent over 2 years ago. The lesion is discrete.  Lateral First Diagonal Branch Vessel is small in size.  Second Diagonal Branch Vessel is small in size.  Left Circumflex The lesion is discrete.  First Obtuse Marginal Branch Vessel is moderate in size. The lesion is discrete.  Second Obtuse Marginal Branch Vessel is small in size.  Fourth Obtuse Marginal Branch Vessel is moderate in size.  Left Posterior Descending Artery Vessel is moderate in size.  Right Coronary Artery Vessel is small. The lesion is discrete. The lesion is discrete.  Intervention  Ost Cx to Prox Cx lesion Angioplasty Lesion crossed with guidewire. Pre-stent angioplasty was performed using a BALLOON EMERGE MR 2.5X12. A STENT PROMUS PREM MR 3.0X12 drug eluting stent was successfully placed. Stent strut is well apposed. Post-stent angioplasty was performed using a BALLOON [place] EMERGE MR 3.25X8. The pre-interventional distal flow is normal (TIMI 3).  The post-interventional distal flow is normal (TIMI 3). The intervention was successful . No complications occurred at this lesion. There is a 0% residual stenosis post intervention.     ECHOCARDIOGRAM  ECHOCARDIOGRAM COMPLETE 10/17/2021  Narrative ECHOCARDIOGRAM REPORT    Patient Name:   Philip Black Date of Exam: 10/17/2021 Medical Rec #:  [id number]     Height:       70.0 in Accession #:    [id number]    Weight:       251.4 lb Date of Birth:  1956-01-11    BSA:          2.300 m Patient Age:    65 years      BP:           148/88 mmHg Patient Gender: M             HR:           75 bpm. Exam Location:  [address]  Procedure: 2D Echo, 3D Echo, Cardiac Doppler, Color Doppler  and Strain Analysis  Indications:    I25.1 CAD  History:        Patient has no prior history of Echocardiogram examinations. CAD; Risk Factors:Hypertension, Dyslipidemia, Obesity and Sleep Apnea.  Sonographer:    Verena Glaser BS, RDCS Referring Phys: [id number] CHRISTOPHER D MCALHANY  IMPRESSIONS   1. Left ventricular ejection fraction, by estimation, is 60 to 65%. The left ventricle has normal function. The left ventricle has no regional wall motion abnormalities. There is moderate left ventricular hypertrophy. Left ventricular diastolic parameters are consistent with Grade I diastolic dysfunction (impaired relaxation). The average left ventricular global longitudinal strain is -17.0 %. The global longitudinal strain is normal. 2. Right ventricular systolic function is normal. The right ventricular size is normal. 3. The mitral valve is grossly normal. Trivial mitral valve regurgitation. 4. The aortic valve is normal in structure. Aortic valve regurgitation is not visualized.  FINDINGS Left Ventricle: Left ventricular ejection fraction, by estimation, is 60 to 65%. The left ventricle has normal function. The left ventricle has no regional wall motion abnormalities. The average left ventricular global  longitudinal strain is -17.0 %. The global longitudinal strain is normal. 3D left ventricular ejection fraction analysis performed but not reported based on interpreter judgement due to suboptimal tracking. The left ventricular internal cavity size was normal in size. There is moderate left ventricular hypertrophy. Left ventricular diastolic parameters are consistent with Grade I diastolic dysfunction (impaired relaxation).  Right Ventricle: The right ventricular size is normal. Right vetricular wall thickness was not well visualized. Right ventricular systolic function is normal.  Left Atrium: Left atrial size was normal in size.  Right Atrium: Right atrial size was normal in  size.  Pericardium: There is no evidence of pericardial effusion.  Mitral Valve: The mitral valve is grossly normal. Trivial mitral valve regurgitation.  Tricuspid Valve: The tricuspid valve is grossly normal. Tricuspid valve regurgitation is trivial.  Aortic Valve: The aortic valve is normal in structure. Aortic valve regurgitation is not visualized.  Pulmonic Valve: The pulmonic valve was normal in structure. Pulmonic valve regurgitation is not visualized.  Aorta: The aortic root and ascending aorta are structurally normal, with no evidence of dilitation.  IAS/Shunts: The atrial septum is grossly normal.   LEFT VENTRICLE PLAX 2D LVIDd:         4.90 cm   Diastology LVIDs:         3.60 cm   LV e' medial:    4.79 cm/s LV PW:         1.40 cm   LV E/e' medial:  15.7 LV IVS:        1.40 cm   LV e' lateral:   6.31 cm/s LVOT diam:     2.40 cm   LV E/e' lateral: 11.9 LV SV:         57 LV SV Index:   25        2D Longitudinal Strain LVOT Area:     4.52 cm  2D Strain GLS (A2C):   -14.7 % 2D Strain GLS (A3C):   -19.5 % 2D Strain GLS (A4C):   -16.7 % 2D Strain GLS Avg:     -17.0 %  3D Volume EF: 3D EF:        50 % LV EDV:       170 ml LV ESV:       85 ml LV SV:        85 ml  RIGHT VENTRICLE             IVC RV Basal diam:  2.80 cm     IVC diam: 1.30 cm RV S prime:     11.50 cm/s TAPSE (M-mode): 2.2 cm  LEFT ATRIUM           Index        RIGHT ATRIUM           Index LA diam:      4.70 cm 2.04 cm/m   RA Pressure: 3.00 mmHg LA Vol (A2C): 33.6 ml 14.61 ml/m  RA Area:     13.60 cm LA Vol (A4C): 19.7 ml 8.56 ml/m   RA Volume:   29.80 ml  12.95 ml/m AORTIC VALVE LVOT Vmax:   67.25 cm/s LVOT Vmean:  45.200 cm/s LVOT VTI:    0.126 m  AORTA Ao Root diam: 3.70 cm Ao Asc diam:  3.50 cm  MITRAL VALVE                TRICUSPID VALVE Estimated RAP:  3.00 mmHg MV Decel Time: 330 msec MV E velocity: 75.20 cm/s  SHUNTS MV A velocity: 121.00 cm/s  Systemic VTI:  0.13 m MV E/A  ratio:  0.62         Systemic Diam: 2.40 cm  Ahmad Alert MD Electronically signed by Ahmad Alert MD Signature Date/Time: 10/17/2021/2:58:44 PM    Final          ______________________________________________________________________________________________       Current Reported Medications:.    Current Meds  Medication Sig   amLODipine  (NORVASC ) 5 MG tablet Take 1 tablet (5 mg total) by mouth daily.   aspirin  EC 81 MG tablet Take 1 tablet (81 mg total) daily by mouth.   colesevelam  (WELCHOL ) 625 MG tablet Take 2 tablets (1,250 mg total) by mouth daily with a meal   Continuous Blood Gluc Sensor (FREESTYLE LIBRE 14 DAY SENSOR) MISC Change sensor every 14 days   Continuous Glucose Sensor (FREESTYLE LIBRE 3 SENSOR) MISC change sensor every 14 days   empagliflozin  (JARDIANCE ) 25 MG TABS tablet Take 1 tablet (25 mg total) by mouth daily.   ezetimibe  (ZETIA ) 10 MG tablet Take 1 tablet (10 mg total) by mouth daily.   insulin  aspart protamine - aspart (NOVOLOG  MIX 70/30 FLEXPEN) (70-30) 100 UNIT/ML FlexPen Inject 80 units under the skin at breakfast, 20 units at lunch, 90 units at evening meal (Patient taking differently: Inject 50-90 Units into the skin See admin instructions. Inject 90 units under the skin in the morning and 50 units in the late afternoon and 50 units in the evening)   Insulin  Pen Needle 32G X 4 MM MISC Use as directed 3 times daily with Novolog  mix flex pen   lisinopril  (ZESTRIL ) 40 MG tablet Take 1 tablet (40 mg total) by mouth daily.   rosuvastatin  (CRESTOR ) 40 MG tablet Take 1 tablet (40 mg total) by mouth daily.   Semaglutide , 1 MG/DOSE, (OZEMPIC , 1 MG/DOSE,) 4 MG/3ML SOPN Inject 1 mg into the skin once a week.   Semaglutide ,0.25 or 0.5MG /DOS, (OZEMPIC , 0.25 OR 0.5 MG/DOSE,) 2 MG/3ML SOPN Inject 0.25 mg into the skin for first four weeks then 0.5 mg Subcutaneous Once a week   sildenafil  (VIAGRA ) 100 MG tablet Take 0.5-1 tablets (50-100 mg total) by mouth 30-60  minutes prior to intercourse as directed.   Vitamin D , Ergocalciferol , (DRISDOL ) 1.25 MG (50000 UNIT) CAPS capsule Take 1 capsule (50,000 Units total) by mouth once a week.   [DISCONTINUED] nitroGLYCERIN  (NITROSTAT ) 0.4 MG SL tablet Place 1 tablet (0.4 mg total) under the tongue every 5 (five) minutes as needed for chest pain (X 3 DOSES MAX).   Physical Exam:    VS:  BP 132/84   Pulse 87   Ht 5\' 10"  (1.778 m)   Wt 246 lb (111.6 kg)   SpO2 95%   BMI 35.30 kg/m    Wt Readings from Last 3 Encounters:  02/23/24 246 lb (111.6 kg)  12/22/23 248 lb (112.5 kg)  07/23/23 253 lb (114.8 kg)    GEN: Well nourished, well developed in no acute distress NECK: No JVD; No carotid bruits CARDIAC: RRR, no murmurs, rubs, gallops RESPIRATORY:  Clear to auscultation without rales, wheezing or rhonchi  ABDOMEN: Soft, non-tender, non-distended EXTREMITIES:  No edema; No acute deformity     Asessement and Plan:.    CAD: S/p DES to LAD in 2009, DES to LCx in 2017. Stable with no anginal symptoms. No indication for ischemic evaluation.  Heart healthy diet and regular cardiovascular exercise encouraged.  Reviewed ED precautions. Continue aspirin , amlodipine , metoprolol , lisinopril  and Zetia .  Refill of sublingual nitroglycerin  provided, discussed interaction between nitroglycerin  and sildenafil , patient notes he is aware.   Hypertension: Blood pressure today 144/88, on repeat was 132/84.  Encouraged patient to obtain blood pressure cuff and monitor his blood pressure at home, he will notify the office if blood pressures consistently elevated above 130/80. Continue current antihypertensive regimen.  Hyperlipidemia: Last profile on 11/20/2023 indicated total cholesterol 128, HDL 50, triglycerides 138 and LDL 55.  Continue rosuvastatin  40 mg daily.  Type 2 diabetes mellitus: A1c was 8.7 on 12/22/2023.  Monitored and managed per PCP.  OSA: Patient reports that he is no longer using CPAP.  Preoperative cardiac  evaluation: Mr. Varelas perioperative risk of a major cardiac event is 6.6% according to the Revised Cardiac Risk Index (RCRI). His functional capacity is good at 7.99 METs according to the Duke Activity Status Index (DASI). Recommendations: According to ACC/AHA guidelines, no further cardiovascular testing needed.  The patient may proceed to surgery at acceptable risk.  Antiplatelet and/or Anticoagulation Recommendations:Regarding ASA therapy, we recommend continuation of ASA throughout the perioperative period. However, if the surgeon feels that cessation of ASA is required in the perioperative period, it may be stopped 5-7 days prior to surgery with a plan to resume it as soon as felt to be feasible from a surgical standpoint in the post-operative period.    Disposition: F/u with Dr. Abel Hoe or Blong Busk, NP in 6 months or sooner if needed.   Signed, Murvin Gift D Yurem Viner, NP

## 2024-02-23 ENCOUNTER — Ambulatory Visit: Payer: Self-pay | Attending: Cardiology | Admitting: Cardiology

## 2024-02-23 ENCOUNTER — Other Ambulatory Visit (HOSPITAL_COMMUNITY): Payer: Self-pay

## 2024-02-23 ENCOUNTER — Encounter: Payer: Self-pay | Admitting: Cardiology

## 2024-02-23 VITALS — BP 132/84 | HR 87 | Ht 70.0 in | Wt 246.0 lb

## 2024-02-23 DIAGNOSIS — I10 Essential (primary) hypertension: Secondary | ICD-10-CM

## 2024-02-23 DIAGNOSIS — Z01810 Encounter for preprocedural cardiovascular examination: Secondary | ICD-10-CM

## 2024-02-23 DIAGNOSIS — I2510 Atherosclerotic heart disease of native coronary artery without angina pectoris: Secondary | ICD-10-CM

## 2024-02-23 DIAGNOSIS — E119 Type 2 diabetes mellitus without complications: Secondary | ICD-10-CM

## 2024-02-23 DIAGNOSIS — E785 Hyperlipidemia, unspecified: Secondary | ICD-10-CM | POA: Diagnosis not present

## 2024-02-23 DIAGNOSIS — Z794 Long term (current) use of insulin: Secondary | ICD-10-CM

## 2024-02-23 DIAGNOSIS — G4733 Obstructive sleep apnea (adult) (pediatric): Secondary | ICD-10-CM

## 2024-02-23 MED ORDER — NITROGLYCERIN 0.4 MG SL SUBL
0.4000 mg | SUBLINGUAL_TABLET | SUBLINGUAL | 6 refills | Status: AC | PRN
  Filled 2024-02-23: qty 25, 8d supply, fill #0

## 2024-02-23 NOTE — Patient Instructions (Signed)
 Medication Instructions:  No changes *If you need a refill on your cardiac medications before your next appointment, please call your pharmacy*  Lab Work: No labs  Testing/Procedures: No testing  Follow-Up: At [facility], you and your health needs are our priority.  As part of our continuing mission to provide you with exceptional heart care, our providers are all part of one team.  This team includes your primary Cardiologist (physician) and Advanced Practice Providers or APPs (Physician Assistants and Nurse Practitioners) who all work together to provide you with the care you need, when you need it.  Your next appointment:   6 month(s)  Provider:   Antoinette Batman, MD or Katlyn West, NP  We recommend signing up for the patient portal called "MyChart".  Sign up information is provided on this After Visit Summary.  MyChart is used to connect with patients for Virtual Visits (Telemedicine).  Patients are able to view lab/test results, encounter notes, upcoming appointments, etc.  Non-urgent messages can be sent to your provider as well.   To learn more about what you can do with MyChart, go to [email or web address].

## 2024-02-26 ENCOUNTER — Other Ambulatory Visit (HOSPITAL_BASED_OUTPATIENT_CLINIC_OR_DEPARTMENT_OTHER): Payer: Self-pay

## 2024-02-26 ENCOUNTER — Other Ambulatory Visit (HOSPITAL_COMMUNITY): Payer: Self-pay

## 2024-02-26 ENCOUNTER — Other Ambulatory Visit: Payer: Self-pay

## 2024-02-26 MED ORDER — NOVOLOG MIX 70/30 FLEXPEN (70-30) 100 UNIT/ML ~~LOC~~ SUPN
180.0000 [IU] | PEN_INJECTOR | SUBCUTANEOUS | 0 refills | Status: AC
  Filled 2024-02-26: qty 45, 25d supply, fill #0
  Filled 2024-04-26: qty 45, 25d supply, fill #1
  Filled 2024-06-28: qty 45, 25d supply, fill #2
  Filled 2024-09-06: qty 45, 25d supply, fill #3

## 2024-03-07 ENCOUNTER — Other Ambulatory Visit (HOSPITAL_BASED_OUTPATIENT_CLINIC_OR_DEPARTMENT_OTHER): Payer: Self-pay

## 2024-03-10 ENCOUNTER — Other Ambulatory Visit (HOSPITAL_COMMUNITY): Payer: Self-pay

## 2024-03-11 ENCOUNTER — Other Ambulatory Visit (HOSPITAL_COMMUNITY): Payer: Self-pay

## 2024-03-11 MED ORDER — FREESTYLE LIBRE 3 SENSOR MISC
2 refills | Status: AC
  Filled 2024-03-11: qty 2, 28d supply, fill #0
  Filled 2024-04-12: qty 2, 28d supply, fill #1
  Filled 2024-05-10: qty 2, 28d supply, fill #2
  Filled 2024-06-09: qty 2, 28d supply, fill #3
  Filled 2024-07-06: qty 2, 28d supply, fill #4
  Filled 2024-07-27: qty 2, 28d supply, fill #5
  Filled 2024-09-08: qty 2, 28d supply, fill #6
  Filled 2024-10-17: qty 2, 28d supply, fill #7

## 2024-03-16 ENCOUNTER — Other Ambulatory Visit (HOSPITAL_COMMUNITY): Payer: Self-pay

## 2024-03-28 ENCOUNTER — Other Ambulatory Visit (HOSPITAL_COMMUNITY): Payer: Self-pay

## 2024-03-28 ENCOUNTER — Other Ambulatory Visit: Payer: Self-pay

## 2024-03-28 MED ORDER — JARDIANCE 25 MG PO TABS
25.0000 mg | ORAL_TABLET | Freq: Every day | ORAL | 3 refills | Status: AC
  Filled 2024-03-28: qty 90, 90d supply, fill #0
  Filled 2024-06-25: qty 90, 90d supply, fill #1
  Filled 2024-09-19: qty 90, 90d supply, fill #2

## 2024-03-29 ENCOUNTER — Other Ambulatory Visit (HOSPITAL_COMMUNITY): Payer: Self-pay

## 2024-03-29 MED ORDER — OZEMPIC (2 MG/DOSE) 8 MG/3ML ~~LOC~~ SOPN
2.0000 mg | PEN_INJECTOR | SUBCUTANEOUS | 12 refills | Status: AC
  Filled 2024-03-29 – 2024-08-17 (×2): qty 3, 28d supply, fill #0

## 2024-03-30 ENCOUNTER — Other Ambulatory Visit (HOSPITAL_COMMUNITY): Payer: Self-pay

## 2024-03-31 ENCOUNTER — Other Ambulatory Visit (HOSPITAL_COMMUNITY): Payer: Self-pay

## 2024-04-12 ENCOUNTER — Other Ambulatory Visit: Payer: Self-pay

## 2024-04-12 ENCOUNTER — Other Ambulatory Visit (HOSPITAL_COMMUNITY): Payer: Self-pay

## 2024-04-14 ENCOUNTER — Other Ambulatory Visit (HOSPITAL_COMMUNITY): Payer: Self-pay

## 2024-04-15 ENCOUNTER — Other Ambulatory Visit (HOSPITAL_COMMUNITY): Payer: Self-pay

## 2024-04-16 ENCOUNTER — Other Ambulatory Visit (HOSPITAL_COMMUNITY): Payer: Self-pay

## 2024-04-21 ENCOUNTER — Ambulatory Visit: Admitting: Physician Assistant

## 2024-04-27 ENCOUNTER — Other Ambulatory Visit: Payer: Self-pay

## 2024-04-28 ENCOUNTER — Other Ambulatory Visit (HOSPITAL_COMMUNITY): Payer: Self-pay

## 2024-04-29 ENCOUNTER — Other Ambulatory Visit: Payer: Self-pay

## 2024-04-29 ENCOUNTER — Other Ambulatory Visit (HOSPITAL_COMMUNITY): Payer: Self-pay

## 2024-04-29 MED ORDER — COLESEVELAM HCL 625 MG PO TABS
1250.0000 mg | ORAL_TABLET | Freq: Every day | ORAL | 2 refills | Status: AC
  Filled 2024-04-29: qty 180, 90d supply, fill #0
  Filled 2024-08-03: qty 180, 90d supply, fill #1
  Filled 2024-11-08: qty 180, 90d supply, fill #2

## 2024-05-02 ENCOUNTER — Other Ambulatory Visit (HOSPITAL_COMMUNITY): Payer: Self-pay

## 2024-05-04 ENCOUNTER — Other Ambulatory Visit (HOSPITAL_BASED_OUTPATIENT_CLINIC_OR_DEPARTMENT_OTHER): Payer: Self-pay

## 2024-05-04 ENCOUNTER — Other Ambulatory Visit (HOSPITAL_COMMUNITY): Payer: Self-pay

## 2024-05-04 MED ORDER — TOBRAMYCIN 0.3 % OP SOLN
1.0000 [drp] | Freq: Four times a day (QID) | OPHTHALMIC | 4 refills | Status: AC
  Filled 2024-05-04: qty 5, 12d supply, fill #0
  Filled 2024-09-05 – 2024-09-06 (×2): qty 5, 12d supply, fill #1

## 2024-05-10 ENCOUNTER — Other Ambulatory Visit: Payer: Self-pay

## 2024-05-23 ENCOUNTER — Other Ambulatory Visit (HOSPITAL_COMMUNITY): Payer: Self-pay

## 2024-05-25 ENCOUNTER — Other Ambulatory Visit (HOSPITAL_COMMUNITY): Payer: Self-pay

## 2024-05-25 MED ORDER — TADALAFIL 20 MG PO TABS
20.0000 mg | ORAL_TABLET | Freq: Every day | ORAL | 5 refills | Status: AC
  Filled 2024-05-25: qty 30, 30d supply, fill #0

## 2024-05-25 MED ORDER — OZEMPIC (2 MG/DOSE) 8 MG/3ML ~~LOC~~ SOPN
2.0000 mg | PEN_INJECTOR | SUBCUTANEOUS | 3 refills | Status: AC
  Filled 2024-05-25: qty 9, 84d supply, fill #0
  Filled 2024-09-21: qty 3, 28d supply, fill #1
  Filled 2024-10-17: qty 3, 28d supply, fill #2

## 2024-05-26 ENCOUNTER — Other Ambulatory Visit (HOSPITAL_COMMUNITY): Payer: Self-pay

## 2024-06-27 ENCOUNTER — Other Ambulatory Visit: Payer: Self-pay

## 2024-06-28 ENCOUNTER — Other Ambulatory Visit (HOSPITAL_COMMUNITY): Payer: Self-pay

## 2024-06-29 ENCOUNTER — Other Ambulatory Visit (HOSPITAL_COMMUNITY): Payer: Self-pay

## 2024-07-11 ENCOUNTER — Other Ambulatory Visit: Payer: Self-pay | Admitting: Nurse Practitioner

## 2024-07-12 ENCOUNTER — Other Ambulatory Visit (HOSPITAL_COMMUNITY): Payer: Self-pay

## 2024-07-13 ENCOUNTER — Other Ambulatory Visit (HOSPITAL_COMMUNITY): Payer: Self-pay

## 2024-07-13 MED ORDER — TESTOSTERONE CYPIONATE 200 MG/ML IM SOLN
100.0000 mg | INTRAMUSCULAR | 0 refills | Status: AC
  Filled 2024-07-13 – 2024-08-17 (×2): qty 4, 28d supply, fill #0

## 2024-07-13 MED ORDER — VITAMIN B-12 1000 MCG PO TABS
1000.0000 ug | ORAL_TABLET | Freq: Every day | ORAL | 1 refills | Status: AC
  Filled 2024-07-13: qty 90, 90d supply, fill #0
  Filled 2024-10-05: qty 90, 90d supply, fill #1

## 2024-07-13 MED ORDER — AMLODIPINE BESYLATE 5 MG PO TABS
5.0000 mg | ORAL_TABLET | Freq: Every day | ORAL | 2 refills | Status: AC
  Filled 2024-07-13: qty 90, 90d supply, fill #0
  Filled 2024-10-13: qty 90, 90d supply, fill #1

## 2024-07-13 MED ORDER — SYNJARDY XR 12.5-1000 MG PO TB24
2.0000 | ORAL_TABLET | Freq: Every day | ORAL | 0 refills | Status: AC
  Filled 2024-07-13: qty 180, 90d supply, fill #0

## 2024-07-13 MED ORDER — TESTOSTERONE CYPIONATE 200 MG/ML IM SOLN
INTRAMUSCULAR | 0 refills | Status: AC
  Filled 2024-07-13: qty 10, 84d supply, fill #0

## 2024-07-13 MED ORDER — SYNJARDY XR 12.5-1000 MG PO TB24
ORAL_TABLET | ORAL | 0 refills | Status: AC
  Filled 2024-07-13: qty 180, 90d supply, fill #0

## 2024-07-13 MED ORDER — VITAMIN B-12 1000 MCG PO TABS
ORAL_TABLET | ORAL | 1 refills | Status: AC
  Filled 2024-07-13: qty 90, 90d supply, fill #0

## 2024-07-14 ENCOUNTER — Other Ambulatory Visit (HOSPITAL_COMMUNITY): Payer: Self-pay

## 2024-07-18 ENCOUNTER — Other Ambulatory Visit (HOSPITAL_COMMUNITY): Payer: Self-pay

## 2024-07-20 ENCOUNTER — Other Ambulatory Visit (HOSPITAL_COMMUNITY): Payer: Self-pay

## 2024-07-27 ENCOUNTER — Other Ambulatory Visit (HOSPITAL_COMMUNITY): Payer: Self-pay

## 2024-07-30 ENCOUNTER — Other Ambulatory Visit (HOSPITAL_COMMUNITY): Payer: Self-pay

## 2024-08-10 ENCOUNTER — Other Ambulatory Visit (HOSPITAL_COMMUNITY): Payer: Self-pay

## 2024-08-10 MED ORDER — TESTOSTERONE CYPIONATE 200 MG/ML IM SOLN
INTRAMUSCULAR | 0 refills | Status: AC
  Filled 2024-08-10: qty 4, 28d supply, fill #0

## 2024-08-11 ENCOUNTER — Other Ambulatory Visit (HOSPITAL_COMMUNITY): Payer: Self-pay

## 2024-08-16 ENCOUNTER — Other Ambulatory Visit (HOSPITAL_COMMUNITY): Payer: Self-pay

## 2024-08-17 ENCOUNTER — Other Ambulatory Visit (HOSPITAL_COMMUNITY): Payer: Self-pay

## 2024-08-24 ENCOUNTER — Other Ambulatory Visit (HOSPITAL_COMMUNITY): Payer: Self-pay

## 2024-09-06 ENCOUNTER — Other Ambulatory Visit (HOSPITAL_COMMUNITY): Payer: Self-pay

## 2024-09-07 ENCOUNTER — Other Ambulatory Visit (HOSPITAL_COMMUNITY): Payer: Self-pay

## 2024-09-08 ENCOUNTER — Other Ambulatory Visit (HOSPITAL_COMMUNITY): Payer: Self-pay

## 2024-09-08 MED ORDER — LISDEXAMFETAMINE DIMESYLATE 30 MG PO CAPS
30.0000 mg | ORAL_CAPSULE | Freq: Every morning | ORAL | 0 refills | Status: DC
  Filled 2024-09-08: qty 30, 30d supply, fill #0

## 2024-09-14 ENCOUNTER — Other Ambulatory Visit (HOSPITAL_COMMUNITY): Payer: Self-pay

## 2024-09-19 ENCOUNTER — Other Ambulatory Visit: Payer: Self-pay | Admitting: Cardiovascular Disease

## 2024-09-20 ENCOUNTER — Other Ambulatory Visit: Payer: Self-pay

## 2024-09-20 ENCOUNTER — Other Ambulatory Visit (HOSPITAL_COMMUNITY): Payer: Self-pay

## 2024-09-20 MED ORDER — EZETIMIBE 10 MG PO TABS
10.0000 mg | ORAL_TABLET | Freq: Every day | ORAL | 2 refills | Status: AC
  Filled 2024-09-20: qty 90, 90d supply, fill #0

## 2024-09-21 ENCOUNTER — Other Ambulatory Visit (HOSPITAL_COMMUNITY): Payer: Self-pay

## 2024-10-05 ENCOUNTER — Other Ambulatory Visit: Payer: Self-pay | Admitting: Nurse Practitioner

## 2024-10-05 ENCOUNTER — Other Ambulatory Visit (HOSPITAL_COMMUNITY): Payer: Self-pay

## 2024-10-05 DIAGNOSIS — I10 Essential (primary) hypertension: Secondary | ICD-10-CM

## 2024-10-07 ENCOUNTER — Other Ambulatory Visit (HOSPITAL_COMMUNITY): Payer: Self-pay

## 2024-10-07 MED ORDER — LISINOPRIL 40 MG PO TABS
40.0000 mg | ORAL_TABLET | Freq: Every day | ORAL | 3 refills | Status: AC
  Filled 2024-10-07: qty 90, 90d supply, fill #0

## 2024-10-13 ENCOUNTER — Other Ambulatory Visit (HOSPITAL_COMMUNITY): Payer: Self-pay

## 2024-10-14 ENCOUNTER — Other Ambulatory Visit (HOSPITAL_COMMUNITY): Payer: Self-pay

## 2024-10-14 MED ORDER — LISDEXAMFETAMINE DIMESYLATE 30 MG PO CAPS
30.0000 mg | ORAL_CAPSULE | Freq: Every morning | ORAL | 0 refills | Status: AC
  Filled 2024-10-14: qty 30, 30d supply, fill #0

## 2024-10-21 ENCOUNTER — Other Ambulatory Visit (HOSPITAL_COMMUNITY): Payer: Self-pay

## 2024-10-21 MED ORDER — SILDENAFIL CITRATE 100 MG PO TABS
ORAL_TABLET | ORAL | 5 refills | Status: AC
  Filled 2024-10-21: qty 8, 8d supply, fill #0
